# Patient Record
Sex: Male | Born: 1965
Health system: Southern US, Community
[De-identification: ages and names within clinical notes are randomized; demographics above are authoritative.]

## PROBLEM LIST (undated history)

## (undated) DIAGNOSIS — H538 Other visual disturbances: Secondary | ICD-10-CM

## (undated) DIAGNOSIS — T7840XA Allergy, unspecified, initial encounter: Secondary | ICD-10-CM

## (undated) DIAGNOSIS — Z8701 Personal history of pneumonia (recurrent): Secondary | ICD-10-CM

## (undated) DIAGNOSIS — E785 Hyperlipidemia, unspecified: Secondary | ICD-10-CM

## (undated) DIAGNOSIS — I10 Essential (primary) hypertension: Secondary | ICD-10-CM

## (undated) DIAGNOSIS — E119 Type 2 diabetes mellitus without complications: Secondary | ICD-10-CM

## (undated) HISTORY — DX: Hyperlipidemia, unspecified: E78.5

## (undated) HISTORY — DX: Allergy, unspecified, initial encounter: T78.40XA

## (undated) HISTORY — DX: Essential (primary) hypertension: I10

## (undated) HISTORY — DX: Other visual disturbances: H53.8

## (undated) HISTORY — DX: Type 2 diabetes mellitus without complications: E11.9

## (undated) HISTORY — DX: Personal history of pneumonia (recurrent): Z87.01

---

## 1987-06-11 DIAGNOSIS — Z8701 Personal history of pneumonia (recurrent): Secondary | ICD-10-CM

## 1987-06-11 DIAGNOSIS — Z8709 Personal history of other diseases of the respiratory system: Secondary | ICD-10-CM | POA: Insufficient documentation

## 1987-06-11 HISTORY — DX: Personal history of pneumonia (recurrent): Z87.01

## 1996-06-10 HISTORY — PX: VASECTOMY: SHX75

## 2006-06-04 ENCOUNTER — Ambulatory Visit: Payer: Self-pay | Admitting: Family Medicine

## 2006-12-03 ENCOUNTER — Ambulatory Visit: Payer: Self-pay | Admitting: Internal Medicine

## 2006-12-03 DIAGNOSIS — M79609 Pain in unspecified limb: Secondary | ICD-10-CM | POA: Insufficient documentation

## 2006-12-18 ENCOUNTER — Encounter: Payer: Self-pay | Admitting: Internal Medicine

## 2006-12-18 DIAGNOSIS — I10 Essential (primary) hypertension: Secondary | ICD-10-CM

## 2006-12-23 ENCOUNTER — Encounter (INDEPENDENT_AMBULATORY_CARE_PROVIDER_SITE_OTHER): Payer: Self-pay | Admitting: *Deleted

## 2008-04-11 ENCOUNTER — Ambulatory Visit: Payer: Self-pay | Admitting: Family Medicine

## 2010-06-10 DIAGNOSIS — E119 Type 2 diabetes mellitus without complications: Secondary | ICD-10-CM

## 2010-06-10 HISTORY — DX: Type 2 diabetes mellitus without complications: E11.9

## 2010-07-24 ENCOUNTER — Encounter: Payer: Self-pay | Admitting: Family Medicine

## 2010-07-24 ENCOUNTER — Ambulatory Visit (INDEPENDENT_AMBULATORY_CARE_PROVIDER_SITE_OTHER): Payer: 59 | Admitting: Family Medicine

## 2010-07-24 ENCOUNTER — Encounter (INDEPENDENT_AMBULATORY_CARE_PROVIDER_SITE_OTHER): Payer: Self-pay | Admitting: *Deleted

## 2010-07-24 DIAGNOSIS — E119 Type 2 diabetes mellitus without complications: Secondary | ICD-10-CM | POA: Insufficient documentation

## 2010-07-24 DIAGNOSIS — R3589 Other polyuria: Secondary | ICD-10-CM

## 2010-07-24 DIAGNOSIS — E1169 Type 2 diabetes mellitus with other specified complication: Secondary | ICD-10-CM | POA: Insufficient documentation

## 2010-07-24 DIAGNOSIS — R358 Other polyuria: Secondary | ICD-10-CM

## 2010-07-24 DIAGNOSIS — E1165 Type 2 diabetes mellitus with hyperglycemia: Secondary | ICD-10-CM | POA: Insufficient documentation

## 2010-07-24 DIAGNOSIS — R7309 Other abnormal glucose: Secondary | ICD-10-CM

## 2010-07-24 LAB — CONVERTED CEMR LAB
ALT: 29 U/L
AST: 22 U/L
Albumin: 4.9 g/dL
Alkaline Phosphatase: 108 U/L
BUN: 12 mg/dL
Bilirubin Urine: NEGATIVE
Blood Glucose, AC Bkfst: 310 mg/dL
CO2: 21 meq/L
Calcium: 10.2 mg/dL
Chloride: 93 meq/L
Cholesterol: 309 mg/dL
Creatinine, Ser: 1.27 mg/dL
Glucose, Bld: 334 mg/dL
HDL: 45 mg/dL
Hgb A1c MFr Bld: 14 %
LDL Cholesterol: HIGH mg/dL
Nitrite: NEGATIVE
Potassium: 3.8 meq/L
Sodium: 133 meq/L
TSH: 1.02 u[IU]/mL
Total Bilirubin: 0.5 mg/dL
Total Protein: 7.4 g/dL
Triglycerides: 739 mg/dL
Urobilinogen, UA: 0.2

## 2010-08-01 ENCOUNTER — Telehealth (INDEPENDENT_AMBULATORY_CARE_PROVIDER_SITE_OTHER): Payer: Self-pay | Admitting: *Deleted

## 2010-08-01 ENCOUNTER — Telehealth: Payer: Self-pay | Admitting: Family Medicine

## 2010-08-01 NOTE — Assessment & Plan Note (Signed)
Summary: TRANSFER FROM BILLIE/CHECK SUGAR/CLE  [employer]   Vital Signs:  Patient profile:   45 year old male Height:      72 inches Weight:      224.75 pounds BMI:     30.59 Temp:     98.5 degrees F oral Pulse rate:   84 / minute Pulse rhythm:   regular BP sitting:   160 / 110  (left arm) Cuff size:   large  Vitals Entered By: Selena Batten Dance CMA Duncan Dull) (July 24, 2010 8:49 AM)  Serial Vital Signs/Assessments:  Time      Position  BP       Pulse  Resp  Temp     By                     152/100                        Eustaquio Boyden  MD  CC: Check sugar Comments Has frequent urination and has lost  ~25 pounds in 45 days (some intentional but he didn't think it should've been that fast). His wife is a Engineer, civil (consulting) and checked his sugar yesterday (non-fasting) and it was 475.    History of Present Illness: CC: concerns about diabetes  wt 250lbs at beginning of year.  started fasting from red meat and started drinking more water.  dropped quickly to 224 lbs in last month.  since lifestyle change started, noticed polyuria and urgency.  Wife checked blood sugar yesterday which was 475.  not recently changed exercise regimen.  does stay active - in am exercise.  No dysuria, fevers/chills, flank pain, vomiting.  mild nausea in am.  No paresthesias.  No fmhx DM.  + fmhx HTN.  04/2010 blood work with elevated triglycerides as well as LDL.  Doesn't bring copy.  fasting today.    HTN - no HA, vision changes, chest pain, tightness, urinary changes, LE swelling.  has had HTN in past, never been on meds, states previuosly ran 130 systolic.  Today elevated but pt concerned about possibility of diabetes.  Current Medications (verified): 1)  None  Allergies: 1)  ! * Latex  Past History:  Past Medical History: Last updated: 12/18/2006 Hypertension Pneumonia, hx of (06/11/1987)  Past Surgical History: vasectomy 1998  Family History: father: healthy mother: HTN MGF: CAD/MI 80yo brother:  prediabetes uncle: some cancer  No other CA, CAD/MI, CVA  Social History: no smoking, occasional EtOH (monthly), no rec drugs caffeine: mountain dew 2 cans, tea about 2 cups/day Lives with wife Banker), 2 kids, no pets Occupation: Designer, industrial/product at [employer]  Review of Systems       per HPI  Physical Exam  General:  alert, well-developed, well-nourished, and well-hydrated.   Head:  normocephalic.   Eyes:  no injection.   Ears:  TMs clear bilaterally Mouth:  MMM, + tongue with yellow/white plaque Neck:  no LAD Lungs:  Normal respiratory effort, chest expands symmetrically. Lungs are clear to auscultation, no crackles or wheezes. Heart:  Normal rate and regular rhythm. S1 and S2 normal without gallop, murmur, click, rub or other extra sounds. Abdomen:  Bowel sounds positive,abdomen soft and non-tender without masses, organomegaly or hernias noted. Pulses:  2+ rad pulses, brisk cap refill Extremities:  no pedal edema Skin:  turgor normal and color normal.     Impression & Recommendations:  Problem # 1:  POLYURIA (ZOX-096.04) Assessment New concerning for new onset diabetes given  cbg yesterday 475 and today elevated 310.  checked UA to r/o infection.  Orders: Venipuncture ([address]109) Glucose, (CBG) (60454) Diabetic Clinic Referral (Diabetic) UA Dipstick w/Micro (automated) (81001) Glucose, (CBG) (09811)  Problem # 2:  OTHER ABNORMAL GLUCOSE (ICD-790.29) Assessment: New likely new onset diabetes.  start metformin, return in 2 wks for f/u.  set up with diabetes education DM.  cbg 310 fasting today, sxs of polyuria and polydipsia and weight loss.  discussed diagnosis, need for long term management and weight loss should help.  discussed pathophysiology of dx as well as most commonly used med, discussed benefits and side effects to watch out for.  His updated medication list for this problem includes:    Metformin Hcl 500 Mg Tabs (Metformin hcl) .Marland Kitchen... Take one nightly for 1 wk then increase  to one two times a day  Orders: Diabetic Clinic Referral (Diabetic) Specimen Handling (91478) TLB-TSH (Thyroid Stimulating Hormone) (84443-TSH) TLB-Hepatic/Liver Function Pnl (80076-HEPATIC) TLB-BMP (Basic Metabolic Panel-BMET) (80048-METABOL) TLB-Lipid Panel (80061-LIPID) TLB-A1C / Hgb A1C (Glycohemoglobin) (83036-A1C)  Problem # 3:  HYPERTENSION (ICD-401.9) bp up today, concerned about new DM dx.  recheck in 2 wks.  advised increase water, decrease sodium, increase potassium.  BP today: 160/110 Prior BP: 130/80 (04/11/2008)  Complete Medication List: 1)  Metformin Hcl 500 Mg Tabs (Metformin hcl) .... Take one nightly for 1 wk then increase to one two times a day 2)  Onetouch Ultra Mini W/device Kit (Blood glucose monitoring suppl) .... Or equivalent dx 250.02 3)  Onetouch Ultrasoft Lancets Misc (Lancets) .... Check twice daily dx 250.02 qs 1 month 4)  Onetouch Test Strp (Glucose blood) .... Check sugars twice daily dx 250.02 qs 1 mo  Patient Instructions: 1)  return in 2 weeks for follow up, and to recheck blood pressure (today it was 152/100).  Increase water, decrease salt. 2)  cut back on mountain dew and tea as they have alot of sugar. 3)  urine checked today. 4)  blood work today [A1c, CMP, TSH, FLP 790.29]. 5)  cbg today. 6)  start medicine called metformin, nightly for 1 wk then increase to twice daily - remember may cause GI upset initially. 7)  We will set you up with diabetes education class, take glucometer and supplies to that class. 8)  Call clinic with questions. 9)  May set up complete physical at your convenience. Prescriptions: ONETOUCH TEST  STRP (GLUCOSE BLOOD) check sugars twice daily dx 250.02 qs 1 mo  #1 x 11   Entered and Authorized by:   Eustaquio Boyden  MD   Signed by:   Eustaquio Boyden  MD on 07/24/2010   Method used:   [facility]        [facility] (retail)       [address]       [place], [place]  [address]        Ph: [phone] or [phone]       Fax: [phone]   RxID:   [id number] [facility] ULTRASOFT LANCETS  MISC (LANCETS) check twice daily dx 250.02 qs 1 month  #1 x 11   Entered and Authorized by:   Eustaquio Boyden  MD   Signed by:   Eustaquio Boyden  MD on 07/24/2010   Method used:   [facility]        [facility] (retail[facility]       [address]       [place], [place]  [address]  Ph: [phone] or [phone]       Fax: [phone]   RxID:   [id number] ONETOUCH ULTRA MINI W/DEVICE KIT (BLOOD GLUCOSE MONITORING SUPPL) or equivalent dx 250.02  #1 x 0   Entered and Authorized by:   Eustaquio Boyden  MD   Signed by:   Eustaquio Boyden  MD on 07/24/2010   Method used:   [facility]        [facility] ([facility]ail[facility]       [address]       [place], [place]  [address]       Ph: [phone] or [phone]       Fax: [phone]   RxID:   [id number] METFORMIN HCL 500 MG TABS (METFORMIN HCL) take one nightly for 1 wk then increase to one two times a day  #60 x 3   Entered and Authorized by:   Eustaquio Boyden  MD   Signed by:   Eustaquio Boyden  MD on 07/24/2010   Method used:   [facility]        [facility] (retail)       [address]       [place], [place]  [address]       Ph: [phone] or [phone]       Fax: [phone]   RxID:   [id number]    Orders Added: 1)  Venipuncture [24580] 2)  Glucose, (CBG) [82962] 3)  Est. Patient Level IV [99833] 4)  Diabetic Clinic Referral [Diabetic] 5)  Specimen Handling [99000] 6)  TLB-TSH (Thyroid Stimulating Hormone) [84443-TSH] 7)  TLB-Hepatic/Liver Function Pnl [80076-HEPATIC] 8)  TLB-BMP (Basic Metabolic Panel-BMET) [80048-METABOL] 9)  TLB-Lipid Panel [80061-LIPID] 10)  TLB-A1C / Hgb A1C (Glycohemoglobin) [83036-A1C] 11)  UA Dipstick w/Micro (automated) [81001] 12)  Glucose, (CBG) [82962]    Prior Medications: Current Allergies  (reviewed today): ! * LATEX  Laboratory Results   Urine Tests  Date/Time Received: July 24, 2010  Date/Time Reported: July 24, 2010   Routine Urinalysis   Color: yellow Appearance: Clear Glucose: 2000+   (Normal Range: Negative) Bilirubin: negative   (Normal Range: Negative) Ketone: moderate (40)   (Normal Range: Negative) Spec. Gravity: 1.020   (Normal Range: 1.003-1.035) Blood: trace-lysed   (Normal Range: Negative) pH: 5.0   (Normal Range: 5.0-8.0) Protein: trace   (Normal Range: Negative) Urobilinogen: 0.2   (Normal Range: 0-1) Nitrite: negative   (Normal Range: Negative) Leukocyte Esterace: negative   (Normal Range: Negative)  Urine Microscopic WBC/HPF: none RBC/HPF: rare Bacteria/HPF: tr Mucous/HPF: yes Epithelial/HPF: none Crystals/HPF: none Casts/LPF: none Yeast/HPF: none    Comments: read by .............Eustaquio Boyden  MD  July 24, 2010 12:27 PM    Blood Tests   Date/Time Received: July 24, 2010  Date/Time Reported: July 24, 2010   CBG Fasting:: 310mg /dL

## 2010-08-02 ENCOUNTER — Encounter: Payer: Self-pay | Admitting: Family Medicine

## 2010-08-02 ENCOUNTER — Other Ambulatory Visit (INDEPENDENT_AMBULATORY_CARE_PROVIDER_SITE_OTHER): Payer: 59

## 2010-08-02 ENCOUNTER — Encounter (INDEPENDENT_AMBULATORY_CARE_PROVIDER_SITE_OTHER): Payer: Self-pay | Admitting: *Deleted

## 2010-08-02 DIAGNOSIS — IMO0001 Reserved for inherently not codable concepts without codable children: Secondary | ICD-10-CM

## 2010-08-02 LAB — CONVERTED CEMR LAB
Creatinine,U: 249.6 mg/dL
LDL Cholesterol: 205 mg/dL

## 2010-08-07 ENCOUNTER — Ambulatory Visit: Payer: 59 | Admitting: Family Medicine

## 2010-08-07 ENCOUNTER — Encounter (INDEPENDENT_AMBULATORY_CARE_PROVIDER_SITE_OTHER): Payer: 59 | Admitting: Family Medicine

## 2010-08-07 ENCOUNTER — Encounter: Payer: Self-pay | Admitting: Family Medicine

## 2010-08-07 DIAGNOSIS — E785 Hyperlipidemia, unspecified: Secondary | ICD-10-CM

## 2010-08-07 DIAGNOSIS — IMO0001 Reserved for inherently not codable concepts without codable children: Secondary | ICD-10-CM

## 2010-08-07 DIAGNOSIS — Z23 Encounter for immunization: Secondary | ICD-10-CM

## 2010-08-07 DIAGNOSIS — Z0000 Encounter for general adult medical examination without abnormal findings: Secondary | ICD-10-CM

## 2010-08-07 DIAGNOSIS — I10 Essential (primary) hypertension: Secondary | ICD-10-CM

## 2010-08-07 DIAGNOSIS — E1169 Type 2 diabetes mellitus with other specified complication: Secondary | ICD-10-CM | POA: Insufficient documentation

## 2010-08-07 DIAGNOSIS — H538 Other visual disturbances: Secondary | ICD-10-CM | POA: Insufficient documentation

## 2010-08-07 NOTE — Progress Notes (Signed)
Summary: metformin causing blurred vision   Phone Note Call from Patient Call back at Home Phone [phone] Call back at [phone]   Caller: Patient Call For: Eustaquio Boyden  MD Summary of Call: Patient recently started taking metformin. He says that just a couple of days after starting it he noticed that his vision was blurry and has gradually gotten worse since. He is asking if it is okay to stop taking it of if he needs to try something else. [facility]  Initial call taken by: Melody Comas,  August 01, 2010 1:40 PM  Follow-up for Phone Call        i would like him to make eye doctor appointment to check vision and check with them.  that is not a side effect of metformin to my knowledge.  for now can switch to actos daily to see if any improvement.  sent in.  has diabetes education called pt to set up appt?   how are sugars running? Follow-up by: Eustaquio Boyden  MD,  August 02, 2010 8:11 AM  Additional Follow-up for Phone Call Additional follow up Details #1::        Spoke with patient. He said he had an eye exam 6 months ago and things were fine. I advised him to contact his eye doctor again to let them know of his recent new DM dx because they need to be aware and may want to do a more detailed exam. He did say the vision change did correspond with the metformin and that he looked it up online and that blurry vison was listed as a side effect. He did stop it and will start the actos. He said he sugars have been running around 180-200. He does not have an appt with Diabetes Ed yet. He said no one has called him to get anything set up.  Additional Follow-up by: Janee Morn CMA (AAMA),  August 02, 2010 9:22 AM   New Allergies: * METFORMIN Additional Follow-up for Phone Call Additional follow up Details #2::    noted.  will discuss at f/u appt tuesday. Follow-up by: Eustaquio Boyden  MD,  August 02, 2010 10:25 AM  New/Updated Medications: ACTOS 30 MG TABS  (PIOGLITAZONE HCL) take one daily for diabetes New Allergies: * METFORMINPrescriptions: ACTOS 30 MG TABS (PIOGLITAZONE HCL) take one daily for diabetes  #30 x 3   Entered and Authorized by:   Eustaquio Boyden  MD   Signed by:   Eustaquio Boyden  MD on 08/02/2010   Method used:   [facility]        [facility]  [place] (retail)       [address]       [place], [place]  [address]       Ph: [phone] or [phone]       Fax: [phone]   RxID:   [id number]

## 2010-08-07 NOTE — Miscellaneous (Signed)
Summary: [facility] results to flowsheet  Clinical Lists Changes  Observations: Added new observation of TSH: 1.020 microintl units/mL (07/24/2010 16:57) Added new observation of HGBA1C: 14.0 % (07/24/2010 16:57) Added new observation of CALCIUM: 10.2 mg/dL (13/01/6577 46:96) Added new observation of ALBUMIN: 4.9 g/dL (29/52/8413 24:40) Added new observation of PROTEIN, TOT: 7.4 g/dL (04/06/2535 64:40) Added new observation of SGPT (ALT): 29 units/L (07/24/2010 16:57) Added new observation of SGOT (AST): 22 units/L (07/24/2010 16:57) Added new observation of ALK PHOS: 108 units/L (07/24/2010 16:57) Added new observation of BILI TOTAL: 0.5 mg/dL (34/74/2595 63:87) Added new observation of CREATININE: 1.27 mg/dL (56/43/3295 18:84) Added new observation of BUN: 12 mg/dL (16/60/6301 60:10) Added new observation of BG RANDOM: 334 mg/dL (93/23/5573 22:02) Added new observation of CO2 PLSM/SER: 21 meq/L (07/24/2010 16:57) Added new observation of CL SERUM: 93 meq/L (07/24/2010 16:57) Added new observation of K SERUM: 3.8 meq/L (07/24/2010 16:57) Added new observation of NA: 133 meq/L (07/24/2010 16:57) Added new observation of LDL: HIGH mg/dL (54/27/0623 76:28) Added new observation of HDL: 45 mg/dL (31/51/7616 07:37) Added new observation of TRIGLYC TOT: 739 mg/dL (10/62/6948 54:62) Added new observation of CHOLESTEROL: 309 mg/dL (70/35/0093 81:82)

## 2010-08-07 NOTE — Progress Notes (Signed)
----   Converted from flag ---- ---- 07/31/2010 1:37 PM, Eustaquio Boyden  MD wrote: would like to rpt trig as well as direct LDL and microalb (272.4,250.02)  at [facility]?  plz ensure pt fasting when draw.  ---- 07/31/2010 11:45 AM, Liane Comber CMA (AAMA) wrote: This pt is scheduled for cpx labs thurs. He recently saw you and had labs done at that visit. Do you want any additional labs to be drawn or should we cancel his upcomming lab appt? Thanks Tash ------------------------------

## 2010-08-14 ENCOUNTER — Ambulatory Visit: Payer: Self-pay | Admitting: Family Medicine

## 2010-08-15 ENCOUNTER — Encounter: Payer: Self-pay | Admitting: Family Medicine

## 2010-08-15 LAB — HM DIABETES EYE EXAM

## 2010-08-16 NOTE — Assessment & Plan Note (Signed)
Summary: CPX / LFW   Vital Signs:  Patient profile:   45 year old male Weight:      225.75 pounds Temp:     98.3 degrees F oral Pulse rate:   76 / minute Pulse rhythm:   regular BP sitting:   136 / 82  (left arm) Cuff size:   large  Vitals Entered By: Selena Batten Dance CMA Duncan Dull) (August 07, 2010 8:39 AM) CC: CPX  Vision Screening:Left eye w/o correction: 20 / 20 Right Eye w/o correction: 20 / 30 Both eyes w/o correction:  20/ 15        Vision Entered By: Selena Batten Dance CMA (AAMA) (August 07, 2010 8:40 AM)   History of Present Illness: CC: CPE and issues  1. blurry vision - noted after starting metformin for diabetes, also feeling pressure in eyes.  Stopped metformin last week (2/22).  Since then blurry vision remaining.  No HA, nauesa/vomiting.  Scheduled to see eye doctor tomorrow morning [facility]).  Does wear reading glasses and for computer (vision last checked this past summer).    2. DM - did not tolerate metformin 2/2 blurry vision.  Started on Actos.  checking sugars, running 190 PM.  Around 2pm running 130.  fasting 130-170.  No paresthesias/numbness in fingers/toes.  scheduled for diabetes education next tuesday.  watching diet and correlating certain foods with sugar spikes.  3. HTN - bp better than previous appointment but a bit up still.  4. HLD - initial trig 700s, on rpt 187.  LDL 205.    CPE today -  no fm hx prostate cancer, no nocturia, strong stream. no fm hx colon cancer, some constipation, having stool every other day.  no blood in stool.   Preventive Screening-Counseling & Management  Alcohol-Tobacco     Smoking Status: quit  Current Medications (verified): 1)  Onetouch Ultra Mini W/device Kit (Blood Glucose Monitoring Suppl) .... or Equivalent Dx 250.02 2)  Onetouch Ultrasoft Lancets  Misc (Lancets) .... Check Twice Daily Dx 250.02 Qs 1 Month 3)  Onetouch Test  Strp (Glucose Blood) .... Check Sugars Twice Daily Dx 250.02 Qs 1 Mo 4)  Actos  30 Mg Tabs (Pioglitazone Hcl) .... Take One Daily For Diabetes  Allergies: 1)  ! 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   Head:  normocephalic.   Eyes:  no injection.   Mouth:  MMM Neck:  no LAD, no bruits, no TM Lungs:  Normal respiratory effort, chest expands symmetrically. Lungs are clear to auscultation, no crackles or wheezes. Heart:  Normal rate and regular rhythm. S1 and S2 normal without gallop, murmur, click, rub or other extra sounds. Pulses:  2+ rad pulses, brisk cap refill Extremities:  no pedal edema Skin:  turgor normal and color normal.     Impression & Recommendations:  Problem # 1:  DIABETES MELLITUS, TYPE II, UNCONTROLLED (ICD-250.02) Assessment Improved according to  sugar log, better control.  scheduled diabetes education for next week.  metformin caused blurry vision.  scheduled for eye exam today.  no conjunctivitis on exam today.  vision some diminished, await vision check.  started on actos.  discussed possible association with bladder cancer.  The following medications were removed from the medication list:    Metformin Hcl 500 Mg Tabs (Metformin hcl) .Marland Kitchen... Take one nightly for 1 wk then increase to one two times a day His updated medication list for this problem includes:    Actos 30 Mg Tabs (Pioglitazone hcl) .Marland Kitchen... Take one daily for diabetes  Labs  Reviewed: Creat: 1.27 (07/24/2010)    Reviewed HgBA1c results: 14.0 (07/24/2010)  Problem # 2:  HYPERTENSION (ICD-401.9) much improved today.  no new meds for now.  microalb normal.  BP today: 136/82 Prior BP: 160/110 (07/24/2010)  Labs Reviewed: K+: 3.8 (07/24/2010) Creat: : 1.27 (07/24/2010)   Chol: 309 (07/24/2010)   HDL: 45 (07/24/2010)   LDL: 205 (08/02/2010)   TG: 187 (08/02/2010)  Problem # 3:  DYSLIPIDEMIA (ICD-272.4) Assessment: New LDL too high , start atorvastatin.  discussed importance of control of LDL.  goal <100.  advised watch for myalgias.  His updated medication list for this problem includes:    Atorvastatin Calcium 40 Mg Tabs (Atorvastatin calcium) .Marland Kitchen... Take one nightly for cholesterol  Labs Reviewed: SGOT: 22 (07/24/2010)   SGPT: 29 (07/24/2010)   HDL:45 (07/24/2010)  LDL:205 (08/02/2010), HIGH (07/24/2010)  Chol:309 (07/24/2010)  Trig:187 (08/02/2010), 739 (07/24/2010)  Problem # 4:  HEALTH MAINTENANCE EXAM (ICD-V70.0) Reviewed preventive care protocols, scheduled due services, and updated immunizations.  prostate screening discussed, deferred to future. colon screening not due.  tdap given today.  Problem # 5:  BLURRED VISION (ICD-368.8) ? if due to metformin.  scheduled eye exam tomorow, advised to ask about metformin, await their recommendations.  Complete Medication List: 1)  Onetouch Ultra Mini W/device Kit (Blood glucose monitoring suppl) .... Or equivalent dx 250.02 2)  Onetouch Ultrasoft Lancets Misc (Lancets) .... Check twice daily dx 250.02 qs 1 month 3)  Onetouch Test Strp (Glucose blood) .... Check sugars twice daily dx 250.02 qs 1 mo 4)  Actos 30 Mg Tabs (Pioglitazone hcl) .... Take one daily for diabetes 5)  Atorvastatin Calcium 40 Mg Tabs (Atorvastatin calcium) .... Take one nightly for cholesterol  Other Orders: Tdap => 37yrs IM ([address]) Admin 1st Vaccine (60454)  Patient Instructions: 1)  Return in 3 months for follow up diabetes. 2)   my email address is [email or web address] 3)  Tdap (tetanus) shot today. 4)  Start medicine for cholesterol called atorvastatin 40mg  daily (lipitor).  watch out for muscle aches. 5)  Good to see you today, call clinic with questions. Prescriptions: ATORVASTATIN CALCIUM 40 MG TABS (ATORVASTATIN CALCIUM) take one nightly for cholesterol  #30 x 6   Entered and Authorized by:   Eustaquio Boyden  MD   Signed by:   Eustaquio Boyden  MD on 08/07/2010   Method used:   [facility]        [facility] (retail)       [address]       [place], [place]  [address]       Ph: [phone] or [phone]       Fax: [phone]   RxID:   [id number]    Orders Added: 1)  Tdap => 40yrs IM [90715] 2)  Admin 1st Vaccine [90471] 3)  Est. Patient 40-64 years [phone]  Immunizations Administered:  Tetanus Vaccine:    Vaccine Type: Tdap    Site: left deltoid    Mfr: GlaxoSmithKline    Dose: 0.5 ml    Route: IM    Given by: Selena Batten Dance CMA (AAMA)    Exp. Date: 03/29/2012    Lot #: [id number]    VIS given: 04/27/08 version given August 07, 2010.   Immunizations Administered:  Tetanus Vaccine:    Vaccine Type: Tdap    Site: left deltoid    Mfr: GlaxoSmithKline    Dose: 0.5 ml    Route: IM    Given by: Selena Batten Dance CMA (AAMA)    Exp. Date: 03/29/2012    Lot #: [id number]    VIS given: 04/27/08 version given August 07, 2010.  Current Allergies (reviewed today): ! * LATEX * METFORMIN   Prevention & Chronic Care Immunizations   Influenza vaccine: Not documented    Tetanus booster: 08/07/2010: Tdap   Tetanus booster due: 06/10/2009    Pneumococcal vaccine: Not documented  Other Screening   PSA: Not documented   Smoking status: quit  (08/07/2010)  Diabetes Mellitus   HgbA1C: 14.0  (07/24/2010)    Eye exam: Not documented    Foot exam: Not documented   High risk foot: Not documented   Foot care education: Not documented    Urine  microalbumin/creatinine ratio: Not documented  Lipids   Total Cholesterol: 309  (07/24/2010)   LDL: 205  (08/02/2010)   LDL Direct: Not documented   HDL: 45  (07/24/2010)   Triglycerides: 187  (08/02/2010)    SGOT (AST): 22  (07/24/2010)   SGPT (ALT): 29  (07/24/2010)   Alkaline phosphatase: 108  (07/24/2010)   Total bilirubin: 0.5  (07/24/2010)  Hypertension   Last Blood Pressure: 136 / 82  (08/07/2010)   Serum creatinine: 1.27  (07/24/2010)   Serum potassium 3.8  (07/24/2010)  Self-Management Support :    Diabetes self-management support: Not documented    Hypertension self-management support: Not documented    Lipid self-management support: Not documented

## 2010-08-28 NOTE — Letter (Signed)
Summary: [facility] - DM education  [facility]   Imported By: Kassie Mends 08/22/2010 09:13:34  _____________________________________________________________________  External Attachment:    Type:   Image     Comment:   External Document  Appended Document:  [facility] DM education started classes

## 2010-09-03 ENCOUNTER — Encounter: Payer: Self-pay | Admitting: Family Medicine

## 2010-09-09 ENCOUNTER — Ambulatory Visit: Payer: Self-pay | Admitting: Family Medicine

## 2010-10-09 ENCOUNTER — Ambulatory Visit: Payer: Self-pay | Admitting: Family Medicine

## 2010-10-26 NOTE — Assessment & Plan Note (Signed)
[facility]                           [place] OFFICE NOTE   Gerald Collins, Gerald Collins                         MRN:          [id number]  DATE:06/04/2006                            DOB:          [birth year]    Gerald Collins is a 45 year old black male who comes to establish with the  practice due to symptoms of upper respiratory infection and probable  conjunctivitis.   He indicates that he has had green wet drainage from his left eye moving  to his right eye for the last 5 days. He has symptoms of a cold with  nasal congestion for greater than a week. He has been using OTC  Clorocidin HP with minimal improvement.   He has had no recent medical care.   CURRENT MEDICATIONS:  None.   ALLERGIES:  LATEX.   PAST MEDICAL HISTORY:  He has a history of pneumonia, measles as a  child, no chicken pox or mumps and a history of hypertension. His only  surgery was a vasectomy in 1998 and was hospitalized for pneumonia in  1989.   SOCIAL HISTORY:  He is married and has 5 children ages 38, 79, 17, 45  and 67. It is of interest that his wife has 2 children in addition. He is  a Designer, industrial/product at [employer]. He indicates that he runs 5 times a week  approximately 2 miles each time. He smokes 1 cigarette every 6-7 months  with stress. Occasionally he uses alcohol and does not use street drugs.   REVIEW OF SYSTEMS:  He denies any headaches, dizziness or fainting. He  does have question of hearing loss. This has not been tested.  CARDIOVASCULAR:  He had the diagnosis of hypertension but was not  treated with medicines. He began running and losing weight and did not  need medication since this change in his lifestyle.  RESPIRATORY/GI/GU/MUSCULOSKELETAL/THROMBUS/ALLERGIC RHINITIS:  All  negative. He does have a tattoo on his left lateral arm. He has never  had a transfusion. He has not had a prostate exam in 10-15 years.   FAMILY HISTORY:  Father is living at age 84 with alcohol  abuse and  questionable depression otherwise no known medical problems. Mother is  living at age 53 with hypertension and hyperlipidemia. He has 2 brothers  living, the younger with a thyroid disorder. The other brother with no  known medical problems. He has two sisters living, the younger with  depression and has been hospitalized for this, the older with no known  medical problems.   With questions regarding the extended family find no further  cardiovascular problems. His mother's side of the family has a lot of  hypertension. Diabetes is in a maternal uncle who is on dialysis. To his  knowledge, there is no cancer, further depression or alcohol abuse in  the family.   IMMUNIZATIONS:  His last tetanus was 5 years ago. He has had the  hepatitis B vaccine but has not had the pneumonia or flu vaccine.   PHYSICAL EXAMINATION:  VITAL SIGNS:  Blood pressure 118/87, temperature  98.7, pulse is 88, weight is 236, height 6 feet.  GENERAL:  Well-developed, well-nourished, white male in no acute  distress.  HEENT:  TMs are retracted bilaterally, fluid present. Nasal mucosa is  erythematous and edematous. Posterior pharynx is injected without  exudate. Facial sinuses are minimally tender. Eyes, both conjunctivae  are injected. There is a small amount of mucous drainage on the lashes.  NECK:  Shoddy lymphadenopathy, no JVD or carotid bruits.  CHEST:  He has a moist harsh cough, no rales, rhonchi or wheezes.  HEART:  Rate and rhythm regular without murmur, gallop or rub.  MUSCULOSKELETAL:  Muscle mass is equal upper and lower extremities.  Strength is 5/5 laterally.  SKIN:  Without obvious lesions in the exposed areas.  PSYCHIATRIC:  Oriented x3. Verbalizes easily.   ASSESSMENT:  1. Upper respiratory infection. Will start him on amoxicillin 500 mg 2      b.i.d. for 7 days. He will continue the OTC per label and will see      him back in 5-7 days if not improved.  2. Conjunctivitis. Sodium  Sulamyd 10% drops both eyes q.i.d. for 1      week. Discussed hunching. Will see him back in 5 days if not      improved.      Billie D. Bean, FNP  Electronically Signed      Arta Silence, MD  Electronically Signed   BDB/MedQ  DD: 06/20/2006  DT: 06/20/2006  Job #: [id number]

## 2010-11-06 ENCOUNTER — Ambulatory Visit (INDEPENDENT_AMBULATORY_CARE_PROVIDER_SITE_OTHER): Payer: 59 | Admitting: Family Medicine

## 2010-11-06 ENCOUNTER — Encounter: Payer: Self-pay | Admitting: Family Medicine

## 2010-11-06 VITALS — BP 118/80 | HR 64 | Temp 98.3°F | Wt 227.1 lb

## 2010-11-06 DIAGNOSIS — IMO0001 Reserved for inherently not codable concepts without codable children: Secondary | ICD-10-CM

## 2010-11-06 DIAGNOSIS — B353 Tinea pedis: Secondary | ICD-10-CM | POA: Insufficient documentation

## 2010-11-06 DIAGNOSIS — I10 Essential (primary) hypertension: Secondary | ICD-10-CM

## 2010-11-06 DIAGNOSIS — E785 Hyperlipidemia, unspecified: Secondary | ICD-10-CM

## 2010-11-06 DIAGNOSIS — M79609 Pain in unspecified limb: Secondary | ICD-10-CM

## 2010-11-06 MED ORDER — CLOTRIMAZOLE 1 % EX CREA: TOPICAL_CREAM | Freq: Two times a day (BID) | CUTANEOUS | Status: DC

## 2010-11-06 NOTE — Progress Notes (Signed)
Addended by: Eustaquio Boyden on: 11/06/2010 05:43 PM   Modules accepted: Orders

## 2010-11-06 NOTE — Assessment & Plan Note (Signed)
On atorvastatin, tolerating. Recheck FLP when returns fasting for blood work. Goal <100.

## 2010-11-06 NOTE — Assessment & Plan Note (Signed)
Good control today, off meds. Anticipate improvement 2/2 lifestyle changes.

## 2010-11-06 NOTE — Assessment & Plan Note (Signed)
Good control as evidenced by recent sugars.  Anticipate improved A1c, <8%.  Discussed goal of <7%, may add metformin if not at goal. Foot exam today. Lab Results  Component Value Date   HGBA1C 14.0 07/24/2010

## 2010-11-06 NOTE — Assessment & Plan Note (Signed)
Recommended clotrimazole cream, keep area dry/clean.

## 2010-11-06 NOTE — Progress Notes (Signed)
  Subjective:    Patient ID: Gerald Collins, male    DOB: 26-Dec-1965, 45 y.o.   MRN: [id number]  HPI CC: DM f/u  DM - checking sugars every day.  Fasting in am 90-110.  Occasional 200s.  Self stopped actos 2/2 bladder cancer concerns.  Sugar dropped to 60s with actos.  Metformin stopped because caused blurry vision.  Denies CP/tightness, SOB.  Wanted to try sugar control with diet/lifestyle changes only.  H/o L foot injury in past, but denies specific inciting event.  S/p injections into foot by podiatry.  Sometimes hurts him even now.  HTN - hasn't recently checked bp at home.  Today good.  3 wks ago 130/80s.  No HA, vision changes, CP/tightness, SOB, leg swelling.  Weight dropped to 217 but then started lifting weights, gained muscle weight.  Has been exercising 1 hour 4 times a day. Wt Readings from Last 3 Encounters:  11/06/10 227 lb 1.9 oz (103.021 kg)  08/07/10 225 lb 12 oz (102.4 kg)  07/24/10 224 lb 12 oz (101.946 kg)    Medications and allergies reviewed and updated in chart. PMHx reviewed and updated in chart.  Review of Systems Per HPI    Objective:   Physical Exam  Nursing note and vitals reviewed. Constitutional: He appears well-developed and well-nourished. No distress.  HENT:  Head: Normocephalic and atraumatic.  Mouth/Throat: Oropharynx is clear and moist. No oropharyngeal exudate.  Eyes: Conjunctivae are normal. Pupils are equal, round, and reactive to light. No scleral icterus.  Neck: Normal range of motion. Neck supple. Carotid bruit is not present.  Cardiovascular: Normal rate, regular rhythm, normal heart sounds and intact distal pulses.   No murmur heard. Pulses:      Radial pulses are 2+ on the right side, and 2+ on the left side.       Dorsalis pedis pulses are 2+ on the right side, and 2+ on the left side.       Posterior tibial pulses are 2+ on the right side, and 2+ on the left side.  Pulmonary/Chest: Effort normal and breath sounds normal. No  respiratory distress. He has no wheezes. He has no rales.  Musculoskeletal:       Right foot: Normal.       Left foot: He exhibits tenderness. He exhibits no bony tenderness and no swelling.       Feet:  Lymphadenopathy:    He has no cervical adenopathy.  Skin: Skin is warm. No rash noted.  Psychiatric: He has a normal mood and affect.       Diabetic foot exam: Some hyperpigmentation and scaling soles bilateral feet consistent with tinea + skin breakdown - between digits bilateral feet with maceration/tinea infection + calluses bilateral medial big toes Normal DP/PT pulses Normal sensation to light tough and decreased sensation monofilament on left sole Nail thickened on R   Assessment & Plan:

## 2010-11-06 NOTE — Patient Instructions (Signed)
Return fasting for blood work some day this week. Good job with sugars up to now.  I expect A1c level to have come down.  Goal is <7%. Pass by [facility]'s office for referral to foot doctor. Call us with questions. Return in 3 months for follow up.

## 2010-11-06 NOTE — Assessment & Plan Note (Signed)
?   Neuroma vs collapse of transverse arch.  discussed metatarsal pad, pt states has had insoles made in past, didn't help. Requests referral to podiatrist to further evaluate.

## 2010-11-08 ENCOUNTER — Other Ambulatory Visit (INDEPENDENT_AMBULATORY_CARE_PROVIDER_SITE_OTHER): Payer: 59 | Admitting: Family Medicine

## 2010-11-08 DIAGNOSIS — I10 Essential (primary) hypertension: Secondary | ICD-10-CM

## 2010-11-08 DIAGNOSIS — E785 Hyperlipidemia, unspecified: Secondary | ICD-10-CM

## 2010-11-08 DIAGNOSIS — IMO0001 Reserved for inherently not codable concepts without codable children: Secondary | ICD-10-CM

## 2010-11-08 LAB — COMPREHENSIVE METABOLIC PANEL
AST: 67 U/L
Albumin: 4.7
BUN: 12 mg/dL (ref 4–21)
CO2: 25 mmol/L
Calcium: 9.6 mg/dL
Chloride: 102 mmol/L
Potassium: 4.3 mmol/L
Total Protein: 6.7 g/dL

## 2010-11-08 LAB — MICROALBUMIN / CREATININE URINE RATIO
Microalb Creat Ratio: 1.1
Microalb, Ur: 1.5

## 2010-11-08 LAB — HEMOGLOBIN A1C: A1c: 6.7

## 2010-11-08 LAB — LIPID PANEL: HDL: 51 mg/dL (ref 35–70)

## 2010-11-09 ENCOUNTER — Ambulatory Visit: Payer: Self-pay | Admitting: Family Medicine

## 2010-11-12 ENCOUNTER — Telehealth: Payer: Self-pay | Admitting: Family Medicine

## 2010-11-12 DIAGNOSIS — E785 Hyperlipidemia, unspecified: Secondary | ICD-10-CM

## 2010-11-12 NOTE — Telephone Encounter (Signed)
See scanned phone note. LDL not at goal.  However LFTs slightly elevated, so no increase in lipitor for now. Would like him to return in 4 mo for recheck. Order in chart. A1c great at 6.7%.  Kidneys, sugar, protein in urine normal.

## 2010-11-13 ENCOUNTER — Encounter: Payer: Self-pay | Admitting: Family Medicine

## 2010-11-13 NOTE — Telephone Encounter (Signed)
Pt returned call, I advised results and scheduled lab appt.

## 2010-11-13 NOTE — Telephone Encounter (Signed)
Attempted to call patient. No answer/no machine at home #. Cell # was incorrect per the person that answered. Will try home # again later.

## 2011-02-12 ENCOUNTER — Encounter: Payer: Self-pay | Admitting: Family Medicine

## 2011-02-12 ENCOUNTER — Ambulatory Visit (INDEPENDENT_AMBULATORY_CARE_PROVIDER_SITE_OTHER): Payer: 59 | Admitting: Family Medicine

## 2011-02-12 DIAGNOSIS — E785 Hyperlipidemia, unspecified: Secondary | ICD-10-CM

## 2011-02-12 DIAGNOSIS — IMO0001 Reserved for inherently not codable concepts without codable children: Secondary | ICD-10-CM

## 2011-02-12 DIAGNOSIS — I10 Essential (primary) hypertension: Secondary | ICD-10-CM

## 2011-02-12 LAB — COMPREHENSIVE METABOLIC PANEL
AST: 22 U/L
Albumin: 4.9
BUN: 17 mg/dL (ref 4–21)
Chloride: 100 mmol/L
Creat: 1.26
Total Bilirubin: 0.5 mg/dL
Total Protein: 7.3 g/dL

## 2011-02-12 NOTE — Assessment & Plan Note (Signed)
Recheck FLP today, off lipitor. Likely restart if elevated above goal.

## 2011-02-12 NOTE — Progress Notes (Signed)
  Subjective:    Patient ID: Gerald Collins, male    DOB: 01/03/1966, 45 y.o.   MRN: [id number]  HPI CC: DM f/u  1. DM - diet/lifestyle controlled.  going to gym daily - weight up from muscle mass.  More grilling currently.  No paresthesias.  A1c from 14 to 6.7% with diet/lifestyle changes.  Metformin trial caused blurry vision.  Sugars at home checked every morning fasting- reading 95-108.  Vision checked march 2012, told normal.  Did attend [facility] DM education class.   Wt Readings from Last 3 Encounters:  02/12/11 229 lb 8 oz (104.101 kg)  11/06/10 227 lb 1.9 oz (103.021 kg)  08/07/10 225 lb 12 oz (102.4 kg)    2. HTN - No HA, vision changes, CP/tightness, SOB, leg swelling.  BP a bit elevated today, states due to stress.  Previously well controlled.  Limits salt intake. BP Readings from Last 3 Encounters:  02/12/11 148/90  11/06/10 118/80  08/07/10 136/82   3. HLD - off lipitor, stopped 1 1/2 mo ago, "missed too many doses" so stopped.  Last LDL 117.  Would like recheck to decide if still needs med (with recent diet and lifestyle changes).  Review of Systems Per HPI    Objective:   Physical Exam  Nursing note and vitals reviewed. Constitutional: He appears well-developed and well-nourished. No distress.  HENT:  Head: Normocephalic and atraumatic.  Mouth/Throat: Oropharynx is clear and moist. No oropharyngeal exudate.  Eyes: Conjunctivae and EOM are normal. Pupils are equal, round, and reactive to light. No scleral icterus.  Neck: Normal range of motion. Neck supple. Carotid bruit is not present.  Cardiovascular: Normal rate, regular rhythm, normal heart sounds and intact distal pulses.   No murmur heard. Pulmonary/Chest: Breath sounds normal. No respiratory distress. He has no wheezes. He has no rales.  Abdominal: Soft. There is no tenderness.       No abd/renal bruits  Skin: Skin is warm and dry. No rash noted.          Assessment & Plan:

## 2011-02-12 NOTE — Assessment & Plan Note (Addendum)
Good control last visit, recheck today.  If good control, may have him return in 6 mo for f/u. Goal <7%. Lab Results  Component Value Date   HGBA1C 14.0 07/24/2010

## 2011-02-12 NOTE — Patient Instructions (Signed)
Blood work today. If bad cholesterol level staying elevated, we will restart lipitor. Depending on blood work will have you return in 3 -6 months.

## 2011-02-12 NOTE — Assessment & Plan Note (Addendum)
elevated today, improved somewhat after recheck. May need to start ACEI if staying elevated.

## 2011-02-18 ENCOUNTER — Encounter: Payer: Self-pay | Admitting: Family Medicine

## 2011-03-12 ENCOUNTER — Other Ambulatory Visit: Payer: 59

## 2011-08-13 ENCOUNTER — Encounter: Payer: Self-pay | Admitting: Family Medicine

## 2011-08-13 ENCOUNTER — Ambulatory Visit (INDEPENDENT_AMBULATORY_CARE_PROVIDER_SITE_OTHER): Payer: 59 | Admitting: Family Medicine

## 2011-08-13 VITALS — BP 128/84 | HR 76 | Temp 98.4°F | Wt 219.5 lb

## 2011-08-13 DIAGNOSIS — E785 Hyperlipidemia, unspecified: Secondary | ICD-10-CM

## 2011-08-13 DIAGNOSIS — J3489 Other specified disorders of nose and nasal sinuses: Secondary | ICD-10-CM

## 2011-08-13 DIAGNOSIS — IMO0001 Reserved for inherently not codable concepts without codable children: Secondary | ICD-10-CM

## 2011-08-13 DIAGNOSIS — R0981 Nasal congestion: Secondary | ICD-10-CM

## 2011-08-13 DIAGNOSIS — B353 Tinea pedis: Secondary | ICD-10-CM

## 2011-08-13 DIAGNOSIS — I10 Essential (primary) hypertension: Secondary | ICD-10-CM

## 2011-08-13 MED ORDER — FLUTICASONE PROPIONATE 50 MCG/ACT NA SUSP: 2.0000 | Freq: Every day | NASAL | Status: DC

## 2011-08-13 NOTE — Assessment & Plan Note (Signed)
Goal LDL <100 given DM hx. Recheck today to see if diet changes/ weight loss has helped LDL reached goal.

## 2011-08-13 NOTE — Assessment & Plan Note (Signed)
Slightly above goal today. Overall stable with diet/lifestyle changes. Discussed starting ACEI - will check Cr today.

## 2011-08-13 NOTE — Assessment & Plan Note (Signed)
Again rec OTC lotrimin.

## 2011-08-13 NOTE — Assessment & Plan Note (Signed)
Trial of flonase sent to pharmacy

## 2011-08-13 NOTE — Patient Instructions (Signed)
Blood work today. For sinus congestion - try flonase (sent to pharmacy) For feet (some athlete's foot) - use lotrimin cream over the counter. Return in 6 months. We will update you with results of blood work.

## 2011-08-13 NOTE — Progress Notes (Signed)
  Subjective:    Patient ID: Gerald Collins, male    DOB: 04-01-1966, 46 y.o.   MRN: [id number]  HPI CC: 6 mo f/u DM  DM - doesn't really check sugars unless feeling ill.  Mild paresthesias in feet.  Last vision screen 08/2010.  Coming up due.  Foot exam today.  Working out 1 1/2 hours/day.  Grills foods.  Weight loss noted - has been trying.  Wt Readings from Last 3 Encounters:  08/13/11 219 lb 8 oz (99.565 kg)  02/12/11 229 lb 8 oz (104.101 kg)  11/06/10 227 lb 1.9 oz (103.021 kg)   Elevated BP in past - also diet controlled.  No HA, vision changes, CP/tightness, SOB, leg swelling.    HLD - advised start lipitor last visit, did not start taking.  Wants rechecked today with diet changes.  Sinus issues - significant posterior nasal drainage.  Has tried sinus meds in past.  Has also used steam and humidifiers which helps some.  Has used neti pot and nasal saline.  Mostly loose drainage.  Has not tried nasal steroid in past.  Review of Systems Per HPI    Objective:   Physical Exam  Nursing note and vitals reviewed. Constitutional: He appears well-developed and well-nourished. No distress.  HENT:  Head: Normocephalic and atraumatic.  Right Ear: External ear normal.  Left Ear: External ear normal.  Nose: Nose normal.  Mouth/Throat: Oropharynx is clear and moist. No oropharyngeal exudate.  Eyes: Conjunctivae and EOM are normal. Pupils are equal, round, and reactive to light. No scleral icterus.  Neck: Normal range of motion. Neck supple.  Cardiovascular: Normal rate, regular rhythm, normal heart sounds and intact distal pulses.   No murmur heard. Pulmonary/Chest: Effort normal and breath sounds normal. No respiratory distress. He has no wheezes. He has no rales.  Musculoskeletal: He exhibits no edema.       Diabetic foot exam: Normal inspection Skin breakdown between lateral digits bilaterally No calluses Normal DP/PT pulses Normal sensation to light touch and monofilament Left  big toe with onychomycosis  Lymphadenopathy:    He has no cervical adenopathy.  Skin: Skin is warm and dry. No rash noted.  Psychiatric: He has a normal mood and affect.      Assessment & Plan:

## 2011-08-13 NOTE — Assessment & Plan Note (Signed)
Great control with A1c 6.% off meds.  Only with diet/lifestyle changes. Congratulated and encouraged. Recheck today. rtc 54mo f/u. rec schedule vision screen.

## 2011-08-14 ENCOUNTER — Other Ambulatory Visit: Payer: Self-pay | Admitting: Family Medicine

## 2011-08-14 ENCOUNTER — Encounter: Payer: Self-pay | Admitting: Family Medicine

## 2011-08-14 LAB — LIPID PANEL
Chol/HDL Ratio: 4.6 ratio units (ref 0.0–5.0)
HDL: 49 mg/dL (ref >39)
LDL Calculated: 152 mg/dL — ABNORMAL HIGH (ref 0–99)
Triglycerides: 110 mg/dL (ref 0–149)
VLDL Cholesterol Cal: 22 mg/dL (ref 5–40)

## 2011-08-14 LAB — BASIC METABOLIC PANEL
CO2: 22 mmol/L (ref 20–32)
Calcium: 9.9 mg/dL (ref 8.7–10.2)
Chloride: 102 mmol/L (ref 97–108)
Potassium: 4.5 mmol/L (ref 3.5–5.2)
Sodium: 140 mmol/L (ref 134–144)

## 2011-08-14 LAB — MICROALBUMIN / CREATININE URINE RATIO: Microalbumin, Urine: 11.2 ug/mL (ref 0.0–17.0)

## 2011-08-14 MED ORDER — LISINOPRIL 5 MG PO TABS: 5.0000 mg | ORAL_TABLET | Freq: Every day | ORAL | Status: DC

## 2011-08-19 ENCOUNTER — Encounter: Payer: Self-pay | Admitting: *Deleted

## 2012-02-06 ENCOUNTER — Other Ambulatory Visit: Payer: Self-pay | Admitting: *Deleted

## 2012-02-06 MED ORDER — GLUCOSE BLOOD VI STRP: ORAL_STRIP | Status: DC

## 2012-02-27 ENCOUNTER — Encounter: Payer: Self-pay | Admitting: Family Medicine

## 2012-02-27 ENCOUNTER — Ambulatory Visit (INDEPENDENT_AMBULATORY_CARE_PROVIDER_SITE_OTHER): Payer: 59 | Admitting: Family Medicine

## 2012-02-27 VITALS — BP 118/80 | HR 68 | Temp 98.2°F | Wt 225.2 lb

## 2012-02-27 DIAGNOSIS — J069 Acute upper respiratory infection, unspecified: Secondary | ICD-10-CM

## 2012-02-27 DIAGNOSIS — I10 Essential (primary) hypertension: Secondary | ICD-10-CM

## 2012-02-27 DIAGNOSIS — E785 Hyperlipidemia, unspecified: Secondary | ICD-10-CM

## 2012-02-27 DIAGNOSIS — E119 Type 2 diabetes mellitus without complications: Secondary | ICD-10-CM

## 2012-02-27 MED ORDER — GUAIFENESIN-CODEINE 100-10 MG/5ML PO SYRP: 5.0000 mL | ORAL_SOLUTION | Freq: Two times a day (BID) | ORAL | Status: DC | PRN

## 2012-02-27 NOTE — Assessment & Plan Note (Signed)
Anticipate viral uri, possible viral sinusitis. Treat with supportive care as per instructions, red flags to concern for bacterial infection discussed. cheratussin for cough at night.

## 2012-02-27 NOTE — Addendum Note (Signed)
Addended by: Eustaquio Boyden on: 02/27/2012 11:59 AM   Modules accepted: Orders

## 2012-02-27 NOTE — Progress Notes (Signed)
  Subjective:    Patient ID: Gerald Collins, male    DOB: 06/21/1965, 46 y.o.   MRN: [id number]  HPI CC: sinus congestion  5d h/o sinus congestion.  Lots of sinus drainage worse at night.  ++ PNdrainage.  + sneezing and RN.  Head congestion/coryza.  ST initially.  Coughing - worse at night.  Dry cough.   Has tried nothing OTC so far.   No fevers/chills, abd pain, n/v, ear or tooth pain.  No HA.  No sick contacts at home . No smokers at home.  No h/o asthma. Recent trip back from [place] - weather change, attributes to illness.  H/o diet controlled diabetes Lab Results  Component Value Date   HGBA1C 6.1* 08/13/2011   Past Medical History  Diagnosis Date  . HTN (hypertension)   . Diabetes type 2, controlled 2012    controlled with lifestyle (diet, exercise) change  . H/O: pneumonia 06/11/1987  . Other specified visual disturbances   . HLD (hyperlipidemia)      Review of Systems Per HPI    Objective:   Physical Exam  Nursing note and vitals reviewed. Constitutional: He appears well-developed and well-nourished. No distress.       Tired, nontoxic  HENT:  Head: Normocephalic and atraumatic.  Right Ear: Hearing, tympanic membrane, external ear and ear canal normal.  Left Ear: Hearing, tympanic membrane, external ear and ear canal normal.  Nose: Mucosal edema present. No rhinorrhea. Right sinus exhibits frontal sinus tenderness. Right sinus exhibits no maxillary sinus tenderness. Left sinus exhibits no maxillary sinus tenderness and no frontal sinus tenderness.  Mouth/Throat: Uvula is midline, oropharynx is clear and moist and mucous membranes are normal. No oropharyngeal exudate, posterior oropharyngeal edema, posterior oropharyngeal erythema or tonsillar abscesses.  Eyes: Conjunctivae normal and EOM are normal. Pupils are equal, round, and reactive to light. No scleral icterus.  Neck: Normal range of motion. Neck supple.  Cardiovascular: Normal rate, regular rhythm, normal heart  sounds and intact distal pulses.   No murmur heard. Pulmonary/Chest: Effort normal and breath sounds normal. No respiratory distress. He has no wheezes. He has no rales.  Lymphadenopathy:    He has no cervical adenopathy.  Skin: Skin is warm and dry. No rash noted.       Assessment & Plan:

## 2012-02-27 NOTE — Patient Instructions (Signed)
Return in next few months fasting for blood work and afterwards for follow up. You have a sinus infection, but likely viral. Take medicine as prescribed: cheratussin for cough at night. Push fluids and plenty of rest. Nasal saline irrigation or neti pot to help drain sinuses. May use simple mucinex with plenty of fluid to help mobilize mucous. May use ibuprofen for sinus inflammation Let us know if fever >101.5, trouble opening/closing mouth, difficulty swallowing, or worsening - you may need to be seen again.

## 2012-04-02 ENCOUNTER — Ambulatory Visit: Payer: 59 | Admitting: Family Medicine

## 2012-04-02 DIAGNOSIS — Z0289 Encounter for other administrative examinations: Secondary | ICD-10-CM

## 2012-12-17 ENCOUNTER — Other Ambulatory Visit: Payer: Self-pay

## 2013-07-09 ENCOUNTER — Encounter: Payer: Self-pay | Admitting: Family Medicine

## 2013-07-09 ENCOUNTER — Ambulatory Visit (INDEPENDENT_AMBULATORY_CARE_PROVIDER_SITE_OTHER): Payer: 59 | Admitting: Family Medicine

## 2013-07-09 VITALS — BP 136/82 | HR 76 | Temp 98.9°F | Wt 236.5 lb

## 2013-07-09 DIAGNOSIS — J069 Acute upper respiratory infection, unspecified: Secondary | ICD-10-CM

## 2013-07-09 DIAGNOSIS — B9789 Other viral agents as the cause of diseases classified elsewhere: Principal | ICD-10-CM

## 2013-07-09 MED ORDER — GUAIFENESIN-CODEINE 100-10 MG/5ML PO SYRP: 5.0000 mL | ORAL_SOLUTION | Freq: Two times a day (BID) | ORAL | Status: DC | PRN

## 2013-07-09 NOTE — Progress Notes (Signed)
   Subjective:    Patient ID: Gerald Collins, male    DOB: 05/15/1966, 48 y.o.   MRN: [id number]  HPI CC: URI sxs  2d h/o productive cough, congestion, rhinorrhea.  Green mucous.  No appetite.  + HA, PNDrainage.  + fatigue.  No fevrs/chills, abd pain, ear or tooth pain, ST, no body aches So far has tried nothing   Wife sick 2 wks ago. No smokers at home. No h/o asthma. Did not receive flu shot.  Lab Results  Component Value Date   HGBA1C 6.1* 08/13/2011     Past Medical History  Diagnosis Date  . HTN (hypertension)   . Diabetes type 2, controlled 2012    controlled with lifestyle (diet, exercise) change  . H/O: pneumonia 06/11/1987  . Other specified visual disturbances   . HLD (hyperlipidemia)      Review of Systems Per HPI    Objective:   Physical Exam  Nursing note and vitals reviewed. Constitutional: He appears well-developed and well-nourished. No distress.  HENT:  Head: Normocephalic and atraumatic.  Right Ear: Hearing, tympanic membrane, external ear and ear canal normal.  Left Ear: Hearing, tympanic membrane, external ear and ear canal normal.  Nose: Mucosal edema and rhinorrhea present. Right sinus exhibits maxillary sinus tenderness. Right sinus exhibits no frontal sinus tenderness. Left sinus exhibits maxillary sinus tenderness. Left sinus exhibits no frontal sinus tenderness.  Mouth/Throat: Uvula is midline, oropharynx is clear and moist and mucous membranes are normal. No oropharyngeal exudate, posterior oropharyngeal edema, posterior oropharyngeal erythema or tonsillar abscesses.  Nasal mucosal congestion and edema/erythema  Eyes: Conjunctivae and EOM are normal. Pupils are equal, round, and reactive to light. No scleral icterus.  Neck: Normal range of motion. Neck supple.  Cardiovascular: Normal rate, regular rhythm, normal heart sounds and intact distal pulses.   No murmur heard. Pulmonary/Chest: Effort normal and breath sounds normal. No respiratory  distress. He has no wheezes. He has no rales.  Musculoskeletal: He exhibits no edema.  Lymphadenopathy:    He has no cervical adenopathy.  Skin: Skin is warm and dry. No rash noted.       Assessment & Plan:

## 2013-07-09 NOTE — Assessment & Plan Note (Signed)
Anticipate viral given short duration See pt instructions for plan. Red flags to update [place] for abx discussed.

## 2013-07-09 NOTE — Patient Instructions (Signed)
Sounds like you have a viral upper respiratory infection. Antibiotics are not needed for this.  Viral infections usually take 7-10 days to resolve.  The cough can last a few weeks to go away. Use medication as prescribed: codeine cough syrup Push fluids and plenty of rest. Ibuprofen for inflammation and claritin or zyrtec for runny nose during the day. Please return if you are not improving as expected, or if you have high fevers (>101.5) or difficulty swallowing or worsening productive cough. Call clinic with questions.  Good to see you today.

## 2013-07-09 NOTE — Progress Notes (Signed)
Pre-visit discussion using our clinic review tool. No additional management support is needed unless otherwise documented below in the visit note.  

## 2013-09-26 ENCOUNTER — Other Ambulatory Visit: Payer: Self-pay | Admitting: Family Medicine

## 2013-09-26 DIAGNOSIS — E119 Type 2 diabetes mellitus without complications: Secondary | ICD-10-CM

## 2013-10-01 ENCOUNTER — Other Ambulatory Visit (INDEPENDENT_AMBULATORY_CARE_PROVIDER_SITE_OTHER): Payer: 59

## 2013-10-01 DIAGNOSIS — E119 Type 2 diabetes mellitus without complications: Secondary | ICD-10-CM

## 2013-10-01 LAB — COMPREHENSIVE METABOLIC PANEL
ALK PHOS: 57 U/L (ref 39–117)
ALT: 28 U/L (ref 0–53)
AST: 29 U/L (ref 0–37)
Albumin: 4.5 g/dL (ref 3.5–5.2)
BILIRUBIN TOTAL: 0.8 mg/dL (ref 0.3–1.2)
BUN: 13 mg/dL (ref 6–23)
CO2: 29 mEq/L (ref 19–32)
Calcium: 9.6 mg/dL (ref 8.4–10.5)
Chloride: 106 mEq/L (ref 96–112)
Creatinine, Ser: 1.2 mg/dL (ref 0.4–1.5)
GFR: 80.62 mL/min (ref >60.00)
GLUCOSE: 100 mg/dL — AB (ref 70–99)
Potassium: 4 mEq/L (ref 3.5–5.1)
SODIUM: 140 meq/L (ref 135–145)
Total Protein: 7.1 g/dL (ref 6.0–8.3)

## 2013-10-01 LAB — LIPID PANEL
CHOLESTEROL: 230 mg/dL — AB (ref 0–200)
HDL: 41.5 mg/dL (ref >39.00)
LDL CALC: 159 mg/dL — AB (ref 0–99)
TRIGLYCERIDES: 147 mg/dL (ref 0.0–149.0)
Total CHOL/HDL Ratio: 6
VLDL: 29.4 mg/dL (ref 0.0–40.0)

## 2013-10-01 LAB — MICROALBUMIN / CREATININE URINE RATIO
Creatinine,U: 391.8 mg/dL
MICROALB/CREAT RATIO: 0.3 mg/g (ref 0.0–30.0)
Microalb, Ur: 1.3 mg/dL (ref 0.0–1.9)

## 2013-10-01 LAB — HEMOGLOBIN A1C: Hgb A1c MFr Bld: 6 % (ref 4.6–6.5)

## 2013-10-08 ENCOUNTER — Encounter: Payer: Self-pay | Admitting: Family Medicine

## 2013-10-08 ENCOUNTER — Ambulatory Visit (INDEPENDENT_AMBULATORY_CARE_PROVIDER_SITE_OTHER): Payer: 59 | Admitting: Family Medicine

## 2013-10-08 VITALS — BP 136/82 | HR 52 | Temp 97.8°F | Ht 72.0 in | Wt 230.8 lb

## 2013-10-08 DIAGNOSIS — Z0001 Encounter for general adult medical examination with abnormal findings: Secondary | ICD-10-CM | POA: Insufficient documentation

## 2013-10-08 DIAGNOSIS — Z0000 Encounter for general adult medical examination without abnormal findings: Secondary | ICD-10-CM | POA: Insufficient documentation

## 2013-10-08 DIAGNOSIS — R7309 Other abnormal glucose: Secondary | ICD-10-CM

## 2013-10-08 DIAGNOSIS — I10 Essential (primary) hypertension: Secondary | ICD-10-CM

## 2013-10-08 DIAGNOSIS — E785 Hyperlipidemia, unspecified: Secondary | ICD-10-CM

## 2013-10-08 DIAGNOSIS — R7303 Prediabetes: Secondary | ICD-10-CM

## 2013-10-08 NOTE — Assessment & Plan Note (Signed)
Reviewed dietary changes to affect better LDL.  Diet controlled.

## 2013-10-08 NOTE — Progress Notes (Signed)
BP 136/82  Pulse 52  Temp(Src) 97.8 F (36.6 C) (Oral)  Ht 6' (1.829 m)  Wt 230 lb 12 oz (104.668 kg)  BMI 31.29 kg/m2   CC: CPE  Subjective:    Patient ID: Gerald Collins, male    DOB: 02-04-1966, 47 y.o.   MRN: [id number]  HPI: Gerald Collins is a 48 y.o. male presenting on 10/08/2013 for Annual Exam   HTN - off meds, stable.  Better at home - 125/80. DM - doesn't hceck sugars at home. Actually A1c has been consistently <6.5%.   HLD - off meds, diet controlled. Wt Readings from Last 3 Encounters:  10/08/13 230 lb 12 oz (104.668 kg)  07/09/13 236 lb 8 oz (107.276 kg)  02/27/12 225 lb 4 oz (102.173 kg)  Body mass index is 31.29 kg/(m^2). Exercises every morning 1-2 hours.  Push ups, sit ups, free weights, runs 20 mi/wk on treadmill, rides bike. Wonders about low testosterone - normal sex drive, but possibly decreased energy and some moodiness endorsed.  Preventative: Colon cancer screening - too early.  Denies blood in stool or BM changes Prostate cancer - no fmhx Td 2012 Flu - doesn't receive Seat belt use discussed No changing spots on skin.  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  Allergies and medications reviewed and updated. Current Outpatient Prescriptions on File Prior to Visit  Medication Sig  . glucose blood test strip Test once daily as needed.   No current facility-administered medications on file prior to visit.    Review of Systems  Constitutional: Negative for fever, chills, activity change, appetite change, fatigue and unexpected weight change.  HENT: Negative for hearing loss.   Eyes: Negative for visual disturbance.  Respiratory: Negative for cough, chest tightness, shortness of breath and wheezing.     Cardiovascular: Negative for chest pain, palpitations and leg swelling.  Gastrointestinal: Negative for nausea, vomiting, abdominal pain, diarrhea, constipation, blood in stool and abdominal distention.  Genitourinary: Negative for hematuria and difficulty urinating.  Musculoskeletal: Negative for arthralgias, myalgias and neck pain.  Skin: Negative for rash.  Neurological: Negative for dizziness, seizures, syncope and headaches.  Hematological: Negative for adenopathy. Does not bruise/bleed easily.  Psychiatric/Behavioral: Negative for dysphoric mood. The patient is not nervous/anxious.    Per HPI unless specifically indicated above    Objective:    BP 136/82  Pulse 52  Temp(Src) 97.8 F (36.6 C) (Oral)  Ht 6' (1.829 m)  Wt 230 lb 12 oz (104.668 kg)  BMI 31.29 kg/m2  Physical Exam  Nursing note and vitals reviewed. Constitutional: He is oriented to person, place, and time. He appears well-developed and well-nourished. No distress.  HENT:  Head: Normocephalic and atraumatic.  Right Ear: Hearing, tympanic membrane, external ear and ear canal normal.  Left Ear: Hearing, tympanic membrane, external ear and ear canal normal.  Nose: Nose normal.  Mouth/Throat: Uvula is midline, oropharynx is clear and moist and mucous membranes are normal. No oropharyngeal exudate, posterior oropharyngeal edema or posterior oropharyngeal erythema.  Eyes: Conjunctivae and EOM are normal. Pupils are equal, round, and reactive to light. No scleral icterus.  Neck: Normal range of motion. Neck supple. No thyromegaly present.  Cardiovascular: Normal rate, regular rhythm, normal heart sounds and intact distal pulses.   No murmur heard.  Pulses:      Radial pulses are 2+ on the right side, and 2+ on the left side.  Pulmonary/Chest: Effort normal and breath sounds normal. No respiratory distress. He has no wheezes. He has no rales.  Abdominal: Soft. Bowel sounds are normal. He exhibits no distension and no  mass. There is no tenderness. There is no rebound and no guarding.  Musculoskeletal: Normal range of motion. He exhibits no edema.  Lymphadenopathy:    He has no cervical adenopathy.  Neurological: He is alert and oriented to person, place, and time.  CN grossly intact, station and gait intact  Skin: Skin is warm and dry. No rash noted.  Psychiatric: He has a normal mood and affect. His behavior is normal. Judgment and thought content normal.   Results for orders placed in visit on 10/01/13  COMPREHENSIVE METABOLIC PANEL      Result Value Ref Range   Sodium 140  135 - 145 mEq/L   Potassium 4.0  3.5 - 5.1 mEq/L   Chloride 106  96 - 112 mEq/L   CO2 29  19 - 32 mEq/L   Glucose, Bld 100 (*) 70 - 99 mg/dL   BUN 13  6 - 23 mg/dL   Creatinine, Ser 1.2  0.4 - 1.5 mg/dL   Total Bilirubin 0.8  0.3 - 1.2 mg/dL   Alkaline Phosphatase 57  39 - 117 U/L   AST 29  0 - 37 U/L   ALT 28  0 - 53 U/L   Total Protein 7.1  6.0 - 8.3 g/dL   Albumin 4.5  3.5 - 5.2 g/dL   Calcium 9.6  8.4 - 10.5 mg/dL   GFR 80.62  >60.00 mL/min  LIPID PANEL      Result Value Ref Range   Cholesterol 230 (*) 0 - 200 mg/dL   Triglycerides 147.0  0.0 - 149.0 mg/dL   HDL 41.50  >39.00 mg/dL   VLDL 29.4  0.0 - 40.0 mg/dL   LDL Cholesterol 159 (*) 0 - 99 mg/dL   Total CHOL/HDL Ratio 6    HEMOGLOBIN A1C      Result Value Ref Range   Hemoglobin A1C 6.0  4.6 - 6.5 %  MICROALBUMIN / CREATININE URINE RATIO      Result Value Ref Range   Microalb, Ur 1.3  0.0 - 1.9 mg/dL   Creatinine,U 391.8     Microalb Creat Ratio 0.3  0.0 - 30.0 mg/g      Assessment & Plan:   Problem List Items Addressed This Visit   HYPERTENSION     Chronic, stable. Off meds.    Prediabetes     Actually sugars consistently <6.5% so will change diagnosis to prediabetes. Discussed this with patient.    DYSLIPIDEMIA     Reviewed dietary changes to affect better LDL.  Diet controlled.    Health care maintenance - Primary     Preventative protocols  reviewed and updated unless pt declined. Discussed healthy diet and lifestyle.        Follow up plan: No Follow-up on file.

## 2013-10-08 NOTE — Assessment & Plan Note (Signed)
Preventative protocols reviewed and updated unless pt declined. Discussed healthy diet and lifestyle.  

## 2013-10-08 NOTE — Assessment & Plan Note (Signed)
Chronic, stable. Off meds.

## 2013-10-08 NOTE — Patient Instructions (Signed)
Good to see you, congratulations with your healthy diet and lifestyle choices!  Keep up the good work. Return to see me as needed or in 1 year for next physical.

## 2013-10-08 NOTE — Progress Notes (Signed)
Pre visit review using our clinic review tool, if applicable. No additional management support is needed unless otherwise documented below in the visit note. 

## 2013-10-08 NOTE — Assessment & Plan Note (Signed)
Actually sugars consistently <6.5% so will change diagnosis to prediabetes. Discussed this with patient.

## 2013-10-11 ENCOUNTER — Telehealth: Payer: Self-pay | Admitting: Family Medicine

## 2013-10-11 NOTE — Telephone Encounter (Signed)
Relevant patient education mailed to patient.  

## 2015-06-16 ENCOUNTER — Ambulatory Visit (INDEPENDENT_AMBULATORY_CARE_PROVIDER_SITE_OTHER): Payer: 59 | Admitting: Family Medicine

## 2015-06-16 ENCOUNTER — Encounter: Payer: Self-pay | Admitting: Family Medicine

## 2015-06-16 VITALS — BP 130/88 | HR 76 | Temp 98.3°F | Wt 239.5 lb

## 2015-06-16 DIAGNOSIS — R1013 Epigastric pain: Secondary | ICD-10-CM

## 2015-06-16 NOTE — Progress Notes (Signed)
BP 130/88 mmHg  Pulse 76  Temp(Src) 98.3 F (36.8 C) (Oral)  Wt 239 lb 8 oz (108.636 kg)   CC: ?hernia  Subjective:    Patient ID: Gerald Collins, male    DOB: January 16, 1966, 50 y.o.   MRN: [id number]  HPI: Gerald Collins is a 50 y.o. male presenting on 06/16/2015 for ? hernia   Presents with wife who is Therapist, sports. Last seen >1 yr ago. Due for CPE.  Yesterday while driving down street started laughing and felt pressure in abdomen that locked up. Felt severe cramping pain that lasted 10 min and had to get out of car. Pain did not radiate. Persistent abdominal dull soreness. This is second time it happened (last several months ago). More gassy as well. BM changes - thinning of stool and less frequent (3x/day -> 1x/day). Some pallor to stool. Possible mild GERD sxs this morning - acid reflux.  Hasn't tried any medicines for this yet.   No fevers/chills, nausea/vomiting , diarrhea or constipation, black tarry stool or blood in stool, no other abd pain. No chest pain, cough or dyspnea. No diet changes recently.   Relevant past medical, surgical, family and social history reviewed and updated as indicated. Interim medical history since our last visit reviewed. Allergies and medications reviewed and updated. Current Outpatient Prescriptions on File Prior to Visit  Medication Sig  . glucose blood test strip Test once daily as needed. (Patient not taking: Reported on 06/16/2015)   No current facility-administered medications on file prior to visit.    Review of Systems Per HPI unless specifically indicated in ROS section     Objective:    BP 130/88 mmHg  Pulse 76  Temp(Src) 98.3 F (36.8 C) (Oral)  Wt 239 lb 8 oz (108.636 kg)  Wt Readings from Last 3 Encounters:  06/16/15 239 lb 8 oz (108.636 kg)  10/08/13 230 lb 12 oz (104.668 kg)  07/09/13 236 lb 8 oz (107.276 kg)   Body mass index is 32.47 kg/(m^2).  Physical Exam  Constitutional: He appears well-developed and well-nourished. No  distress.  Cardiovascular: Normal rate, regular rhythm, normal heart sounds and intact distal pulses.   No murmur heard. Pulmonary/Chest: Effort normal and breath sounds normal. No respiratory distress. He has no wheezes. He has no rales.  Abdominal: Soft. Normal appearance and bowel sounds are normal. He exhibits no distension and no mass. There is no hepatosplenomegaly. There is tenderness in the epigastric area. There is no rigidity, no rebound, no guarding, no CVA tenderness and negative Murphy's sign. A hernia is present. Hernia confirmed positive in the ventral area (possible small with contraction of abd muscles).  Musculoskeletal: He exhibits no edema.  Psychiatric: He has a normal mood and affect.  Nursing note and vitals reviewed.     Assessment & Plan:  Advised pt return for CPE at his convenience Problem List Items Addressed This Visit    Abdominal pain, epigastric - Primary    Story and exam consistent with possible small ventral hernia. Ddx includes abd wall strain, cholelithiasis. Will check labs today (CBC, CMP, lipase) and order complete abd [place] + limited abd [place] to eval for ventral hernia. Encouraged weight loss. Update if recurrent pain for referral to surgery. Pt and wife agree with plan.      Relevant Orders   Comprehensive metabolic panel   CBC with Differential/Platelet   Lipase   US Abdomen Complete   US Abdomen Limited       Follow  up plan: Return if symptoms worsen or fail to improve.

## 2015-06-16 NOTE — Patient Instructions (Signed)
labwork today, pass by our referral coordinators to schedule ultrasound. Return at your convenience for physical.  Ventral Hernia A ventral hernia (also called an incisional hernia) is a hernia that occurs at the site of a previous surgical cut (incision) in the abdomen. The abdominal wall spans from your lower chest down to your pelvis. If the abdominal wall is weakened from a surgical incision, a hernia can occur. A hernia is a bulge of bowel or muscle tissue pushing out on the weakened part of the abdominal wall. Ventral hernias can get bigger from straining or lifting. Obese and older people are at higher risk for a ventral hernia. People who develop infections after surgery or require repeat incisions at the same site on the abdomen are also at increased risk. CAUSES  A ventral hernia occurs because of weakness in the abdominal wall at an incision site.  SYMPTOMS  Common symptoms include:  A visible bulge or lump on the abdominal wall.  Pain or tenderness around the lump.  Increased discomfort if you cough or make a sudden movement. If the hernia has blocked part of the intestine, a serious complication can occur (incarcerated or strangulated hernia). This can become a problem that requires emergency surgery because the blood flow to the blocked intestine may be cut off. Symptoms may include:  Feeling sick to your stomach (nauseous).  Throwing up (vomiting).  Stomach swelling (distention) or bloating.  Fever.  Rapid heartbeat. DIAGNOSIS  Your health care provider will take a medical history and perform a physical exam. Various tests may be ordered, such as:  Blood tests.  Urine tests.  Ultrasonography.  X-rays.  Computed tomography (CT). TREATMENT  Watchful waiting may be all that is needed for a smaller hernia that does not cause symptoms. Your health care provider may recommend the use of a supportive belt (truss) that helps to keep the abdominal wall intact. For larger  hernias or those that cause pain, surgery to repair the hernia is usually recommended. If a hernia becomes strangulated, emergency surgery needs to be done right away. HOME CARE INSTRUCTIONS  Avoid putting pressure or strain on the abdominal area.  Avoid heavy lifting.  Use good body positioning for physical tasks. Ask your health care provider about proper body positioning.  Use a supportive belt as directed by your health care provider.  Maintain a healthy weight.  Eat foods that are high in fiber, such as whole grains, fruits, and vegetables. Fiber helps prevent difficult bowel movements (constipation).  Drink enough fluids to keep your urine clear or pale yellow.  Follow up with your health care provider as directed. SEEK MEDICAL CARE IF:   Your hernia seems to be getting larger or more painful. SEEK IMMEDIATE MEDICAL CARE IF:   You have abdominal pain that is sudden and sharp.  Your pain becomes severe.  You have repeated vomiting.  You are sweating a lot.  You notice a rapid heartbeat.  You develop a fever. MAKE SURE YOU:   Understand these instructions.  Will watch your condition.  Will get help right away if you are not doing well or get worse.   This information is not intended to replace advice given to you by your health care provider. Make sure you discuss any questions you have with your health care provider.   Document Released: 05/13/2012 Document Revised: 06/17/2014 Document Reviewed: 05/13/2012 Elsevier Interactive Patient Education [employer].

## 2015-06-16 NOTE — Progress Notes (Signed)
Pre visit review using our clinic review tool, if applicable. No additional management support is needed unless otherwise documented below in the visit note. 

## 2015-06-16 NOTE — Assessment & Plan Note (Addendum)
Story and exam consistent with possible small ventral hernia. Ddx includes abd wall strain, cholelithiasis. Will check labs today (CBC, CMP, lipase) and order complete abd [place] + limited abd [place] to eval for ventral hernia. Encouraged weight loss. Update if recurrent pain for referral to surgery. Pt and wife agree with plan.

## 2015-06-16 NOTE — Addendum Note (Signed)
Addended by: Daralene Milch C on: 06/16/2015 11:17 AM   Modules accepted: Orders

## 2015-06-17 LAB — CBC WITH DIFFERENTIAL/PLATELET
Basophils Absolute: 0 10*3/uL (ref 0.0–0.2)
Basos: 1 %
EOS (ABSOLUTE): 0.1 10*3/uL (ref 0.0–0.4)
Eos: 2 %
Hematocrit: 40 % (ref 37.5–51.0)
Hemoglobin: 13.9 g/dL (ref 12.6–17.7)
Immature Grans (Abs): 0 10*3/uL (ref 0.0–0.1)
Immature Granulocytes: 0 %
Lymphocytes Absolute: 1.6 10*3/uL (ref 0.7–3.1)
Lymphs: 42 %
MCH: 27.4 pg (ref 26.6–33.0)
MCHC: 34.8 g/dL (ref 31.5–35.7)
MCV: 79 fL (ref 79–97)
Monocytes Absolute: 0.5 10*3/uL (ref 0.1–0.9)
Monocytes: 14 %
NEUTROS ABS: 1.5 10*3/uL (ref 1.4–7.0)
NEUTROS PCT: 41 %
Platelets: 195 10*3/uL (ref 150–379)
RBC: 5.08 x10E6/uL (ref 4.14–5.80)
RDW: 13.4 % (ref 12.3–15.4)
WBC: 3.7 10*3/uL (ref 3.4–10.8)

## 2015-06-17 LAB — COMPREHENSIVE METABOLIC PANEL
A/G RATIO: 1.9 (ref 1.1–2.5)
ALT: 70 IU/L — AB (ref 0–44)
AST: 52 IU/L — AB (ref 0–40)
Albumin: 4.8 g/dL (ref 3.5–5.5)
Alkaline Phosphatase: 69 IU/L (ref 39–117)
BILIRUBIN TOTAL: 0.3 mg/dL (ref 0.0–1.2)
BUN/Creatinine Ratio: 12 (ref 9–20)
BUN: 15 mg/dL (ref 6–24)
CO2: 25 mmol/L (ref 18–29)
Calcium: 9.8 mg/dL (ref 8.7–10.2)
Chloride: 100 mmol/L (ref 96–106)
Creatinine, Ser: 1.29 mg/dL — ABNORMAL HIGH (ref 0.76–1.27)
GFR calc Af Amer: 74 mL/min/{1.73_m2} (ref >59)
GFR calc non Af Amer: 64 mL/min/{1.73_m2} (ref >59)
Globulin, Total: 2.5 g/dL (ref 1.5–4.5)
Glucose: 96 mg/dL (ref 65–99)
POTASSIUM: 4.3 mmol/L (ref 3.5–5.2)
SODIUM: 139 mmol/L (ref 134–144)
Total Protein: 7.3 g/dL (ref 6.0–8.5)

## 2015-06-17 LAB — LIPASE: Lipase: 19 U/L (ref 0–59)

## 2015-06-19 ENCOUNTER — Encounter: Payer: Self-pay | Admitting: *Deleted

## 2015-06-20 ENCOUNTER — Ambulatory Visit
Admission: RE | Admit: 2015-06-20 | Discharge: 2015-06-20 | Disposition: A | Discharge status: Home / Self Care | Payer: 59 | Source: Ambulatory Visit | Attending: Family Medicine | Admitting: Family Medicine

## 2015-06-20 DIAGNOSIS — R1013 Epigastric pain: Secondary | ICD-10-CM

## 2015-07-16 ENCOUNTER — Other Ambulatory Visit: Payer: Self-pay | Admitting: Family Medicine

## 2015-07-16 DIAGNOSIS — R7303 Prediabetes: Secondary | ICD-10-CM

## 2015-07-16 DIAGNOSIS — E785 Hyperlipidemia, unspecified: Secondary | ICD-10-CM

## 2015-07-16 DIAGNOSIS — R740 Nonspecific elevation of levels of transaminase and lactic acid dehydrogenase [LDH]: Secondary | ICD-10-CM

## 2015-07-16 DIAGNOSIS — I10 Essential (primary) hypertension: Secondary | ICD-10-CM

## 2015-07-16 DIAGNOSIS — Z125 Encounter for screening for malignant neoplasm of prostate: Secondary | ICD-10-CM

## 2015-07-16 DIAGNOSIS — R7401 Elevation of levels of liver transaminase levels: Secondary | ICD-10-CM

## 2015-07-17 ENCOUNTER — Other Ambulatory Visit (INDEPENDENT_AMBULATORY_CARE_PROVIDER_SITE_OTHER): Payer: 59

## 2015-07-17 DIAGNOSIS — I10 Essential (primary) hypertension: Secondary | ICD-10-CM | POA: Diagnosis not present

## 2015-07-17 DIAGNOSIS — E785 Hyperlipidemia, unspecified: Secondary | ICD-10-CM | POA: Diagnosis not present

## 2015-07-17 DIAGNOSIS — R740 Nonspecific elevation of levels of transaminase and lactic acid dehydrogenase [LDH]: Secondary | ICD-10-CM

## 2015-07-17 DIAGNOSIS — R7401 Elevation of levels of liver transaminase levels: Secondary | ICD-10-CM

## 2015-07-17 DIAGNOSIS — Z125 Encounter for screening for malignant neoplasm of prostate: Secondary | ICD-10-CM

## 2015-07-17 DIAGNOSIS — R7303 Prediabetes: Secondary | ICD-10-CM

## 2015-07-17 LAB — COMPREHENSIVE METABOLIC PANEL
ALBUMIN: 4.9 g/dL (ref 3.5–5.2)
ALT: 30 U/L (ref 0–53)
AST: 20 U/L (ref 0–37)
Alkaline Phosphatase: 66 U/L (ref 39–117)
BILIRUBIN TOTAL: 0.5 mg/dL (ref 0.2–1.2)
BUN: 14 mg/dL (ref 6–23)
CALCIUM: 9.9 mg/dL (ref 8.4–10.5)
CO2: 31 meq/L (ref 19–32)
CREATININE: 1.29 mg/dL (ref 0.40–1.50)
Chloride: 102 mEq/L (ref 96–112)
GFR: 75.75 mL/min (ref >60.00)
Glucose, Bld: 112 mg/dL — ABNORMAL HIGH (ref 70–99)
Potassium: 4.3 mEq/L (ref 3.5–5.1)
Sodium: 140 mEq/L (ref 135–145)
Total Protein: 7.5 g/dL (ref 6.0–8.3)

## 2015-07-17 LAB — IBC PANEL
Iron: 76 ug/dL (ref 42–165)
SATURATION RATIOS: 16.5 % — AB (ref 20.0–50.0)
TRANSFERRIN: 330 mg/dL (ref 212.0–360.0)

## 2015-07-17 LAB — LIPID PANEL
CHOL/HDL RATIO: 6
CHOLESTEROL: 273 mg/dL — AB (ref 0–200)
HDL: 45.8 mg/dL (ref >39.00)
LDL Cholesterol: 197 mg/dL — ABNORMAL HIGH (ref 0–99)
NonHDL: 226.82
TRIGLYCERIDES: 147 mg/dL (ref 0.0–149.0)
VLDL: 29.4 mg/dL (ref 0.0–40.0)

## 2015-07-17 LAB — HEMOGLOBIN A1C: Hgb A1c MFr Bld: 6.5 % (ref 4.6–6.5)

## 2015-07-17 LAB — TSH: TSH: 1.43 u[IU]/mL (ref 0.35–4.50)

## 2015-07-17 LAB — PSA: PSA: 0.2 ng/mL (ref 0.10–4.00)

## 2015-07-24 ENCOUNTER — Encounter: Payer: Self-pay | Admitting: Family Medicine

## 2015-07-24 ENCOUNTER — Ambulatory Visit (INDEPENDENT_AMBULATORY_CARE_PROVIDER_SITE_OTHER): Payer: 59 | Admitting: Family Medicine

## 2015-07-24 VITALS — BP 136/76 | HR 64 | Temp 98.0°F | Wt 239.2 lb

## 2015-07-24 DIAGNOSIS — R1013 Epigastric pain: Secondary | ICD-10-CM

## 2015-07-24 DIAGNOSIS — Z0000 Encounter for general adult medical examination without abnormal findings: Secondary | ICD-10-CM

## 2015-07-24 DIAGNOSIS — Z1211 Encounter for screening for malignant neoplasm of colon: Secondary | ICD-10-CM

## 2015-07-24 DIAGNOSIS — E663 Overweight: Secondary | ICD-10-CM | POA: Insufficient documentation

## 2015-07-24 DIAGNOSIS — R6882 Decreased libido: Secondary | ICD-10-CM

## 2015-07-24 DIAGNOSIS — E785 Hyperlipidemia, unspecified: Secondary | ICD-10-CM

## 2015-07-24 DIAGNOSIS — E669 Obesity, unspecified: Secondary | ICD-10-CM | POA: Insufficient documentation

## 2015-07-24 DIAGNOSIS — E119 Type 2 diabetes mellitus without complications: Secondary | ICD-10-CM

## 2015-07-24 MED ORDER — ATORVASTATIN CALCIUM 40 MG PO TABS: 40.0000 mg | ORAL_TABLET | Freq: Every day | ORAL | Status: DC

## 2015-07-24 NOTE — Assessment & Plan Note (Signed)
Good control. Reviewed labs.

## 2015-07-24 NOTE — Assessment & Plan Note (Signed)
Encouraged healthy diet and lifestyle changes to affect sustainable weight loss.  

## 2015-07-24 NOTE — Progress Notes (Signed)
BP 136/76 mmHg  Pulse 64  Temp(Src) 98 F (36.7 C) (Oral)  Wt 239 lb 4 oz (108.523 kg)   CC: CPE  Subjective:    Patient ID: [name], male    DOB: 1966-01-24, 50 y.o.   MRN: [id number]  HPI: SAVA GEHMAN is a 50 y.o. male presenting on 07/24/2015 for Annual Exam   Persistent discomfort worse with greasy foods. Endorses pressure sensation in abdomen.   Diet controlled diabetes - reviewed with patient.   Requests testosterone level checked. Noticing more trouble keeping muscle mass. Denies depression, energy level ok. Some decreased libido.   Preventative: Colon cancer screening - discussed. Interested in colonoscopy. Prostate cancer - no fmhx. No nocturia or weakening of stream. Requests continued screening.  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 Interim medical history since our last visit reviewed. Allergies and medications reviewed and updated. Current Outpatient Prescriptions on File Prior to Visit  Medication Sig  . glucose blood test strip Test once daily as needed. (Patient not taking: Reported on 06/16/2015)   No current facility-administered medications on file prior to visit.    Review of Systems  Constitutional: Negative for fever, chills, activity change, appetite change, fatigue and unexpected weight change.  HENT: Negative for hearing loss.   Eyes: Negative for visual disturbance.  Respiratory: Negative for cough, chest tightness, shortness of breath and wheezing.   Cardiovascular: Negative for chest pain, palpitations and leg swelling.  Gastrointestinal: Positive for abdominal pain (bloating), diarrhea and  constipation. Negative for nausea, vomiting, blood in stool and abdominal distention.  Genitourinary: Negative for hematuria and difficulty urinating.  Musculoskeletal: Negative for myalgias, arthralgias and neck pain.  Skin: Negative for rash.  Neurological: Negative for dizziness, seizures, syncope and headaches.  Hematological: Negative for adenopathy. Does not bruise/bleed easily.  Psychiatric/Behavioral: Negative for dysphoric mood. The patient is not nervous/anxious.    Per HPI unless specifically indicated in ROS section     Objective:    BP 136/76 mmHg  Pulse 64  Temp(Src) 98 F (36.7 C) (Oral)  Wt 239 lb 4 oz (108.523 kg)  Wt Readings from Last 3 Encounters:  07/24/15 239 lb 4 oz (108.523 kg)  06/16/15 239 lb 8 oz (108.636 kg)  10/08/13 230 lb 12 oz (104.668 kg)   Body mass index is 32.44 kg/(m^2).  Physical Exam  Constitutional: He is oriented to person, place, and time. He appears well-developed and well-nourished. No distress.  HENT:  Head: Normocephalic and atraumatic.  Right Ear: Hearing, tympanic membrane, external ear and ear canal normal.  Left Ear: Hearing, tympanic membrane, external ear and ear canal normal.  Nose: Nose normal.  Mouth/Throat: Uvula is midline, oropharynx is clear and moist and mucous membranes are normal. No oropharyngeal exudate, posterior oropharyngeal edema or posterior oropharyngeal erythema.  Eyes: Conjunctivae and EOM are normal. Pupils are equal, round, and reactive to light. No scleral icterus.  Neck: Normal range of motion. Neck supple. No thyromegaly present.  Cardiovascular: Normal rate, regular rhythm, normal heart sounds and intact distal pulses.   No murmur heard. Pulses:      Radial pulses are 2+ on the right side, and 2+ on the  left side.  Pulmonary/Chest: Effort normal and breath sounds normal. No respiratory distress. He has no wheezes. He has no rales.  Abdominal: Soft. Bowel sounds are normal. He exhibits no distension and  no mass. There is no tenderness. There is no rebound and no guarding.  Genitourinary: Rectum normal and prostate normal. Rectal exam shows no external hemorrhoid, no internal hemorrhoid, no fissure, no mass, no tenderness and anal tone normal. Prostate is not enlarged (15gm) and not tender.  Musculoskeletal: Normal range of motion. He exhibits no edema.  Lymphadenopathy:    He has no cervical adenopathy.  Neurological: He is alert and oriented to person, place, and time.  CN grossly intact, station and gait intact  Skin: Skin is warm and dry. No rash noted.  Psychiatric: He has a normal mood and affect. His behavior is normal. Judgment and thought content normal.  Nursing note and vitals reviewed.  Results for orders placed or performed in visit on 07/17/15  Lipid panel  Result Value Ref Range   Cholesterol 273 (H) 0 - 200 mg/dL   Triglycerides 147.0 0.0 - 149.0 mg/dL   HDL 45.80 >39.00 mg/dL   VLDL 29.4 0.0 - 40.0 mg/dL   LDL Cholesterol 197 (H) 0 - 99 mg/dL   Total CHOL/HDL Ratio 6    NonHDL 226.82   Comprehensive metabolic panel  Result Value Ref Range   Sodium 140 135 - 145 mEq/L   Potassium 4.3 3.5 - 5.1 mEq/L   Chloride 102 96 - 112 mEq/L   CO2 31 19 - 32 mEq/L   Glucose, Bld 112 (H) 70 - 99 mg/dL   BUN 14 6 - 23 mg/dL   Creatinine, Ser 1.29 0.40 - 1.50 mg/dL   Total Bilirubin 0.5 0.2 - 1.2 mg/dL   Alkaline Phosphatase 66 39 - 117 U/L   AST 20 0 - 37 U/L   ALT 30 0 - 53 U/L   Total Protein 7.5 6.0 - 8.3 g/dL   Albumin 4.9 3.5 - 5.2 g/dL   Calcium 9.9 8.4 - 10.5 mg/dL   GFR 75.75 >60.00 mL/min  Hemoglobin A1c  Result Value Ref Range   Hgb A1c MFr Bld 6.5 4.6 - 6.5 %  TSH  Result Value Ref Range   TSH 1.43 0.35 - 4.50 uIU/mL  PSA  Result Value Ref Range   PSA 0.20 0.10 - 4.00 ng/mL  IBC panel  Result Value Ref Range   Iron 76 42 - 165 ug/dL   Transferrin 330.0 212.0 - 360.0 mg/dL   Saturation Ratios 16.5 (L) 20.0 - 50.0 %      Assessment & Plan:   Problem  List Items Addressed This Visit    Obesity, Class I, BMI 30-34.9    Encouraged healthy diet and lifestyle changes to affect sustainable weight loss      Health care maintenance - Primary    Preventative protocols reviewed and updated unless pt declined. Discussed healthy diet and lifestyle.       Dyslipidemia    LDL too high - start atorvastatin 40mg  daily. Discussed side effects to monitor. RTC 6 mo f/u visit.       Relevant Medications   atorvastatin (LIPITOR) 40 MG tablet   Diet-controlled diabetes mellitus ([place])    Good control. Reviewed labs.      Relevant Medications   atorvastatin (LIPITOR) 40 MG tablet   Abdominal pain, epigastric    No ventral hernia appreciated today - actually sxs improved with healthier diet changes. ?IBS -  suggested decrease gas producing foods, avoid lactose containing foods, trial probiotic or trial beano. Update if not improved with this.       Other Visit Diagnoses    Special screening for malignant neoplasms, colon        Relevant Orders    Ambulatory referral to Gastroenterology    Decreased sex drive        Relevant Orders    Testosterone        Follow up plan: Return in about 6 months (around 01/21/2016), or as needed, for follow up visit.

## 2015-07-24 NOTE — Patient Instructions (Addendum)
Exclude gas producing foods (beans, onions, celery, carrots, raisins, bananas, apricots, prunes, brussel sprouts, wheat germ, pretzels) Consider trail of lactose free diet (back off milk). Consider trial of beano for easier digestion of complex carbs. Consider trial probiotic like align or philips colon health.  We will refer you for colonoscopy screening. Try lipitor (atorvastatin) 40mg  daily for better cholesterol control Return in 6 months for f/u, prior fasting for labs. Return at your convenience for 8am blood work.  Health Maintenance, Male A healthy lifestyle and preventative care can promote health and wellness.  Maintain regular health, dental, and eye exams.  Eat a healthy diet. Foods like vegetables, fruits, whole grains, low-fat dairy products, and lean protein foods contain the nutrients you need and are low in calories. Decrease your intake of foods high in solid fats, added sugars, and salt. Get information about a proper diet from your health care provider, if necessary.  Regular physical exercise is one of the most important things you can do for your health. Most adults should get at least 150 minutes of moderate-intensity exercise (any activity that increases your heart rate and causes you to sweat) each week. In addition, most adults need muscle-strengthening exercises on 2 or more days a week.   Maintain a healthy weight. The body mass index (BMI) is a screening tool to identify possible weight problems. It provides an estimate of body fat based on height and weight. Your health care provider can find your BMI and can help you achieve or maintain a healthy weight. For males 20 years and older:  A BMI below 18.5 is considered underweight.  A BMI of 18.5 to 24.9 is normal.  A BMI of 25 to 29.9 is considered overweight.  A BMI of 30 and above is considered obese.  Maintain normal blood lipids and cholesterol by exercising and minimizing your intake of saturated fat. Eat a  balanced diet with plenty of fruits and vegetables. Blood tests for lipids and cholesterol should begin at age 72 and be repeated every 5 years. If your lipid or cholesterol levels are high, you are over age 57, or you are at high risk for heart disease, you may need your cholesterol levels checked more frequently.Ongoing high lipid and cholesterol levels should be treated with medicines if diet and exercise are not working.  If you smoke, find out from your health care provider how to quit. If you do not use tobacco, do not start.  Lung cancer screening is recommended for adults aged 26-80 years who are at high risk for developing lung cancer because of a history of smoking. A yearly low-dose CT scan of the lungs is recommended for people who have at least a 30-pack-year history of smoking and are current smokers or have quit within the past 15 years. A pack year of smoking is smoking an average of 1 pack of cigarettes a day for 1 year (for example, a 30-pack-year history of smoking could mean smoking 1 pack a day for 30 years or 2 packs a day for 15 years). Yearly screening should continue until the smoker has stopped smoking for at least 15 years. Yearly screening should be stopped for people who develop a health problem that would prevent them from having lung cancer treatment.  If you choose to drink alcohol, do not have more than 2 drinks per day. One drink is considered to be 12 oz (360 mL) of beer, 5 oz (150 mL) of wine, or 1.5 oz (45 mL) of  liquor.  Avoid the use of street drugs. Do not share needles with anyone. Ask for help if you need support or instructions about stopping the use of drugs.  High blood pressure causes heart disease and increases the risk of stroke. High blood pressure is more likely to develop in:  People who have blood pressure in the end of the normal range (100-139/85-89 mm Hg).  People who are overweight or obese.  People who are African American.  If you are 73-40  years of age, have your blood pressure checked every 3-5 years. If you are 2 years of age or older, have your blood pressure checked every year. You should have your blood pressure measured twice--once when you are at a hospital or clinic, and once when you are not at a hospital or clinic. Record the average of the two measurements. To check your blood pressure when you are not at a hospital or clinic, you can use:  An automated blood pressure machine at a pharmacy.  A home blood pressure monitor.  If you are 36-35 years old, ask your health care provider if you should take aspirin to prevent heart disease.  Diabetes screening involves taking a blood sample to check your fasting blood sugar level. This should be done once every 3 years after age 14 if you are at a normal weight and without risk factors for diabetes. Testing should be considered at a younger age or be carried out more frequently if you are overweight and have at least 1 risk factor for diabetes.  Colorectal cancer can be detected and often prevented. Most routine colorectal cancer screening begins at the age of 28 and continues through age 80. However, your health care provider may recommend screening at an earlier age if you have risk factors for colon cancer. On a yearly basis, your health care provider may provide home test kits to check for hidden blood in the stool. A small camera at the end of a tube may be used to directly examine the colon (sigmoidoscopy or colonoscopy) to detect the earliest forms of colorectal cancer. Talk to your health care provider about this at age 35 when routine screening begins. A direct exam of the colon should be repeated every 5-10 years through age 45, unless early forms of precancerous polyps or small growths are found.  People who are at an increased risk for hepatitis B should be screened for this virus. You are considered at high risk for hepatitis B if:  You were born in a country where  hepatitis B occurs often. Talk with your health care provider about which countries are considered high risk.  Your parents were born in a high-risk country and you have not received a shot to protect against hepatitis B (hepatitis B vaccine).  You have HIV or AIDS.  You use needles to inject street drugs.  You live with, or have sex with, someone who has hepatitis B.  You are a man who has sex with other men (MSM).  You get hemodialysis treatment.  You take certain medicines for conditions like cancer, organ transplantation, and autoimmune conditions.  Hepatitis C blood testing is recommended for all people born from [birth year] through 1966 and any individual with known risk factors for hepatitis C.  Healthy men should no longer receive prostate-specific antigen (PSA) blood tests as part of routine cancer screening. Talk to your health care provider about prostate cancer screening.  Testicular cancer screening is not recommended for adolescents or adult  males who have no symptoms. Screening includes self-exam, a health care provider exam, and other screening tests. Consult with your health care provider about any symptoms you have or any concerns you have about testicular cancer.  Practice safe sex. Use condoms and avoid high-risk sexual practices to reduce the spread of sexually transmitted infections (STIs).  You should be screened for STIs, including gonorrhea and chlamydia if:  You are sexually active and are younger than 24 years.  You are older than 24 years, and your health care provider tells you that you are at risk for this type of infection.  Your sexual activity has changed since you were last screened, and you are at an increased risk for chlamydia or gonorrhea. Ask your health care provider if you are at risk.  If you are at risk of being infected with HIV, it is recommended that you take a prescription medicine daily to prevent HIV infection. This is called pre-exposure  prophylaxis (PrEP). You are considered at risk if:  You are a man who has sex with other men (MSM).  You are a heterosexual man who is sexually active with multiple partners.  You take drugs by injection.  You are sexually active with a partner who has HIV.  Talk with your health care provider about whether you are at high risk of being infected with HIV. If you choose to begin PrEP, you should first be tested for HIV. You should then be tested every 3 months for as long as you are taking PrEP.  Use sunscreen. Apply sunscreen liberally and repeatedly throughout the day. You should seek shade when your shadow is shorter than you. Protect yourself by wearing long sleeves, pants, a wide-brimmed hat, and sunglasses year round whenever you are outdoors.  Tell your health care provider of new moles or changes in moles, especially if there is a change in shape or color. Also, tell your health care provider if a mole is larger than the size of a pencil eraser.  A one-time screening for abdominal aortic aneurysm (AAA) and surgical repair of large AAAs by ultrasound is recommended for men aged 8-75 years who are current or former smokers.  Stay current with your vaccines (immunizations).   This information is not intended to replace advice given to you by your health care provider. Make sure you discuss any questions you have with your health care provider.   Document Released: 11/23/2007 Document Revised: 06/17/2014 Document Reviewed: 10/22/2010 Elsevier Interactive Patient Education [employer].

## 2015-07-24 NOTE — Assessment & Plan Note (Signed)
Preventative protocols reviewed and updated unless pt declined. Discussed healthy diet and lifestyle.  

## 2015-07-24 NOTE — Progress Notes (Signed)
Pre visit review using our clinic review tool, if applicable. No additional management support is needed unless otherwise documented below in the visit note. 

## 2015-07-24 NOTE — Assessment & Plan Note (Signed)
LDL too high - start atorvastatin 40mg  daily. Discussed side effects to monitor. RTC 6 mo f/u visit.

## 2015-07-24 NOTE — Assessment & Plan Note (Signed)
No ventral hernia appreciated today - actually sxs improved with healthier diet changes. ?IBS - suggested decrease gas producing foods, avoid lactose containing foods, trial probiotic or trial beano. Update if not improved with this.

## 2015-07-28 ENCOUNTER — Encounter: Payer: Self-pay | Admitting: Internal Medicine

## 2015-07-28 ENCOUNTER — Other Ambulatory Visit (INDEPENDENT_AMBULATORY_CARE_PROVIDER_SITE_OTHER): Payer: 59

## 2015-07-28 DIAGNOSIS — R6882 Decreased libido: Secondary | ICD-10-CM | POA: Diagnosis not present

## 2015-07-28 NOTE — Addendum Note (Signed)
Addended by: Marchia Bond on: 07/28/2015 09:07 AM   Modules accepted: Orders

## 2015-07-29 LAB — TESTOSTERONE: Testosterone: 225 ng/dL — ABNORMAL LOW (ref 348–1197)

## 2015-08-02 ENCOUNTER — Encounter: Payer: Self-pay | Admitting: Family Medicine

## 2015-08-02 ENCOUNTER — Other Ambulatory Visit: Payer: Self-pay | Admitting: Family Medicine

## 2015-08-02 DIAGNOSIS — R7989 Other specified abnormal findings of blood chemistry: Secondary | ICD-10-CM

## 2015-08-02 DIAGNOSIS — E291 Testicular hypofunction: Secondary | ICD-10-CM | POA: Insufficient documentation

## 2015-08-08 ENCOUNTER — Other Ambulatory Visit (INDEPENDENT_AMBULATORY_CARE_PROVIDER_SITE_OTHER): Payer: 59

## 2015-08-08 DIAGNOSIS — R7989 Other specified abnormal findings of blood chemistry: Secondary | ICD-10-CM

## 2015-08-08 DIAGNOSIS — E291 Testicular hypofunction: Secondary | ICD-10-CM | POA: Diagnosis not present

## 2015-08-09 LAB — TESTOSTERONE,FREE AND TOTAL
TESTOSTERONE FREE: 3.8 pg/mL — AB (ref 7.2–24.0)
Testosterone: 148 ng/dL — ABNORMAL LOW (ref 348–1197)

## 2015-08-09 LAB — LUTEINIZING HORMONE: LH: 6.4 m[IU]/mL (ref 1.7–8.6)

## 2015-08-09 LAB — IRON AND TIBC
IRON SATURATION: 20 % (ref 15–55)
Iron: 83 ug/dL (ref 38–169)
TIBC: 418 ug/dL (ref 250–450)
UIBC: 335 ug/dL (ref 111–343)

## 2015-08-09 LAB — FOLLICLE STIMULATING HORMONE: FSH: 8.8 m[IU]/mL (ref 1.5–12.4)

## 2015-08-12 ENCOUNTER — Encounter: Payer: Self-pay | Admitting: Family Medicine

## 2015-08-25 ENCOUNTER — Encounter: Payer: Self-pay | Admitting: Family Medicine

## 2015-08-25 ENCOUNTER — Telehealth: Payer: Self-pay | Admitting: *Deleted

## 2015-08-25 DIAGNOSIS — E291 Testicular hypofunction: Secondary | ICD-10-CM

## 2015-08-25 NOTE — Telephone Encounter (Signed)
Please see previous My chart message

## 2015-08-25 NOTE — Telephone Encounter (Signed)
Pt sent Mychart message requesting testosterone replacement patch. (see lab results) I'm not familiar with a patch as much as the injections or cream. Could you take a look at this in Dr. Synthia Innocent absence and let me know what to send in for him? Thanks!

## 2015-08-25 NOTE — Telephone Encounter (Signed)
That needs to wait for Dr Darnell Level when he returns

## 2015-08-26 MED ORDER — TESTOSTERONE 2 MG/24HR TD PT24: 2.0000 mg | MEDICATED_PATCH | Freq: Every day | TRANSDERMAL | Status: DC

## 2015-08-26 MED ORDER — TESTOSTERONE 2 MG/24HR TD PT24: 1.0000 | MEDICATED_PATCH | Freq: Every day | TRANSDERMAL | Status: DC

## 2015-08-26 NOTE — Telephone Encounter (Signed)
Price out androderm patch - one onto skin daily. Rx sent to pharmacy. Return in 1 month for 8am rpt lab testing.

## 2015-08-28 ENCOUNTER — Telehealth: Payer: Self-pay | Admitting: *Deleted

## 2015-08-28 NOTE — Telephone Encounter (Signed)
PA approved. Pharmacy notified 

## 2015-08-28 NOTE — Telephone Encounter (Signed)
PA required for Androderm patch. Completed on Cover My Meds. Will await determination.

## 2015-08-28 NOTE — Telephone Encounter (Signed)
Rx called in as directed and patient notified.  

## 2015-08-29 NOTE — Telephone Encounter (Signed)
Anna at [facility] left v/m; Vicente Males said cannot dispense 60 day supply for retail and testosterone 2 mg only comes in box of # 60; the 4 mg comes in box of # 30 and [facility] M[facility] wants to know if can substitute. Dr Darnell Level out of office.

## 2015-08-30 NOTE — Telephone Encounter (Signed)
Dr. Darnell Level ordered 2 gm not 4 gm. Can they give him #30, with 1 refill

## 2015-08-30 NOTE — Telephone Encounter (Signed)
Pharmacy notified and will stress to patient to only use one patch.

## 2015-08-30 NOTE — Telephone Encounter (Signed)
Yes we can do that, as long as patient understands to use 1 patch daily

## 2015-08-30 NOTE — Telephone Encounter (Signed)
Spoke with Vic[facility] Males. She said they can't split the 2mg  box because it's a controlled med. She understands not being able to change him to 4mg , however, she was asking if she could have the SIG changed to say "use one patch for 2 weeks, then increase to 2 patches" to get it covered for the #60 box and then just tell the patient to only use one patch at night like originally written. The 4mg  patch comes #30 only and the 2mg  patch comes #60 only. There just isn't a way to split it.

## 2015-09-09 HISTORY — PX: COLONOSCOPY: SHX174

## 2015-09-13 ENCOUNTER — Ambulatory Visit (AMBULATORY_SURGERY_CENTER): Payer: Self-pay

## 2015-09-13 VITALS — Ht 73.0 in | Wt 235.0 lb

## 2015-09-13 DIAGNOSIS — Z83719 Family history of colon polyps, unspecified: Secondary | ICD-10-CM

## 2015-09-13 DIAGNOSIS — Z8371 Family history of colonic polyps: Secondary | ICD-10-CM

## 2015-09-13 MED ORDER — SUPREP BOWEL PREP KIT 17.5-3.13-1.6 GM/177ML PO SOLN: 1.0000 | Freq: Once | ORAL | Status: DC

## 2015-09-13 NOTE — Progress Notes (Signed)
No allergies to eggs or sy No past exposure to anesthesia No diet/weight loss meds No home oxygen  Has email and internet; registered for emmi

## 2015-09-27 ENCOUNTER — Ambulatory Visit (AMBULATORY_SURGERY_CENTER): Payer: 59 | Admitting: Internal Medicine

## 2015-09-27 ENCOUNTER — Encounter: Payer: Self-pay | Admitting: Internal Medicine

## 2015-09-27 VITALS — BP 113/66 | HR 58 | Temp 97.8°F | Resp 20 | Ht 72.0 in | Wt 230.0 lb

## 2015-09-27 DIAGNOSIS — D128 Benign neoplasm of rectum: Secondary | ICD-10-CM

## 2015-09-27 DIAGNOSIS — Z1211 Encounter for screening for malignant neoplasm of colon: Secondary | ICD-10-CM

## 2015-09-27 DIAGNOSIS — K621 Rectal polyp: Secondary | ICD-10-CM | POA: Diagnosis not present

## 2015-09-27 DIAGNOSIS — Z8371 Family history of colonic polyps: Secondary | ICD-10-CM

## 2015-09-27 MED ORDER — SODIUM CHLORIDE 0.9 % IV SOLN: 500.0000 mL | INTRAVENOUS | Status: DC

## 2015-09-27 NOTE — Patient Instructions (Signed)
Handout given for Polyps.   YOU HAD AN ENDOSCOPIC PROCEDURE TODAY AT [facility]:   Refer to the procedure report that was given to you for any specific questions about what was found during the examination.  If the procedure report does not answer your questions, please call your gastroenterologist to clarify.  If you requested that your care partner not be given the details of your procedure findings, then the procedure report has been included in a sealed envelope for you to review at your convenience later.  YOU SHOULD EXPECT: Some feelings of bloating in the abdomen. Passage of more gas than usual.  Walking can help get rid of the air that was put into your GI tract during the procedure and reduce the bloating. If you had a lower endoscopy (such as a colonoscopy or flexible sigmoidoscopy) you may notice spotting of blood in your stool or on the toilet paper. If you underwent a bowel prep for your procedure, you may not have a normal bowel movement for a few days.  Please Note:  You might notice some irritation and congestion in your nose or some drainage.  This is from the oxygen used during your procedure.  There is no need for concern and it should clear up in a day or so.  SYMPTOMS TO REPORT IMMEDIATELY:   Following lower endoscopy (colonoscopy or flexible sigmoidoscopy):  Excessive amounts of blood in the stool  Significant tenderness or worsening of abdominal pains  Swelling of the abdomen that is new, acute  Fever of 100F or higher   For urgent or emergent issues, a gastroenterologist can be reached at any hour by calling [phone].   DIET: Your first meal following the procedure should be a small meal and then it is ok to progress to your normal diet. Heavy or fried foods are harder to digest and may make you feel nauseous or bloated.  Likewise, meals heavy in dairy and vegetables can increase bloating.  Drink plenty of fluids but you should avoid alcoholic  beverages for 24 hours.  ACTIVITY:  You should plan to take it easy for the rest of today and you should NOT DRIVE or use heavy machinery until tomorrow (because of the sedation medicines used during the test).    FOLLOW UP: Our staff will call the number listed on your records the next business day following your procedure to check on you and address any questions or concerns that you may have regarding the information given to you following your procedure. If we do not reach you, we will leave a message.  However, if you are feeling well and you are not experiencing any problems, there is no need to return our call.  We will assume that you have returned to your regular daily activities without incident.  If any biopsies were taken you will be contacted by phone or by letter within the next 1-3 weeks.  Please call us at [phone] if you have not heard about the biopsies in 3 weeks.    SIGNATURES/CONFIDENTIALITY: You and/or your care partner have signed paperwork which will be entered into your electronic medical record.  These signatures attest to the fact that that the information above on your After Visit Summary has been reviewed and is understood.  Full responsibility of the confidentiality of this discharge information lies with you and/or your care-partner.

## 2015-09-27 NOTE — Progress Notes (Signed)
Patient awakening,vss,report to rn 

## 2015-09-27 NOTE — Progress Notes (Signed)
Called to room to assist during endoscopic procedure.  Patient ID and intended procedure confirmed with present staff. Received instructions for my participation in the procedure from the performing physician.  

## 2015-09-27 NOTE — Op Note (Signed)
[facility] Patient Name: Gerald Collins Procedure Date: 09/27/2015 11:45 AM MRN: [id number] Endoscopist: Docia Chuck. Henrene Pastor , MD Age: 50 Date of Birth: [birth year] Gender: Male Procedure:                Colonoscopy, with snare polypectomy X one Indications:              Screening for colorectal malignant neoplasm Medicines:                Monitored Anesthesia Care Procedure:                Pre-Anesthesia Assessment:                           - Prior to the procedure, a History and Physical                            was performed, and patient medications and                            allergies were reviewed. The patient's tolerance of                            previous anesthesia was also reviewed. The risks                            and benefits of the procedure and the sedation                            options and risks were discussed with the patient.                            All questions were answered, and informed consent                            was obtained. Prior Anticoagulants: The patient has                            taken no previous anticoagulant or antiplatelet                            agents. ASA Grade Assessment: II - A patient with                            mild systemic disease. After reviewing the risks                            and benefits, the patient was deemed in                            satisfactory condition to undergo the procedure.                           After obtaining informed consent, the colonoscope  was passed under direct vision. Throughout the                            procedure, the patient's blood pressure, pulse, and                            oxygen saturations were monitored continuously. The                            Model CF-HQ190L [id number]) scope was introduced                            through the anus and advanced to the the cecum,                            identified by appendiceal  orifice and ileocecal                            valve. The ileocecal valve, appendiceal orifice,                            and rectum were photographed. The quality of the                            bowel preparation was excellent. The colonoscopy                            was performed without difficulty. The patient                            tolerated the procedure well. The bowel preparation                            used was SUPREP. Scope In: 11:58:09 AM Scope Out: 12:08:27 PM Scope Withdrawal Time: 0 hours 8 minutes 56 seconds  Total Procedure Duration: 0 hours 10 minutes 18 seconds  Findings:                 A 5 mm polyp was found in the rectum. The polyp was                            pedunculated. The polyp was removed with a cold                            snare. Resection and retrieval were complete.                           The exam was otherwise without abnormality on                            direct and retroflexion views. Complications:            No immediate complications. Estimated blood loss:  None. Estimated Blood Loss:     Estimated blood loss: none. Recommendation:           - Patient has a contact number available for                            emergencies. The signs and symptoms of potential                            delayed complications were discussed with the                            patient. Return to normal activities tomorrow.                            Written discharge instructions were provided to the                            patient.                           - Resume previous diet.                           - Continue present medications.                           - Repeat colonoscopy in 5-10 years for                            surveillance, pending path. Docia Chuck. Henrene Pastor, MD 09/27/2015 12:16:07 PM This report has been signed electronically. CC Letter to:             Ria Bush

## 2015-09-28 ENCOUNTER — Telehealth: Payer: Self-pay | Admitting: *Deleted

## 2015-09-28 NOTE — Telephone Encounter (Signed)
  Follow up Call-  Call back number 09/27/2015  Post procedure Call Back phone  # [phone] cell   Permission to leave phone message Yes     Patient questions:  Do you have a fever, pain , or abdominal swelling? No. Pain Score  0 *  Have you tolerated food without any problems? Yes.    Have you been able to return to your normal activities? Yes.    Do you have any questions about your discharge instructions: Diet   No. Medications  No. Follow up visit  No.  Do you have questions or concerns about your Care? No.  Actions: * If pain score is 4 or above: No action needed, pain <4.  "I"m fine" per pt.

## 2015-10-02 ENCOUNTER — Encounter: Payer: Self-pay | Admitting: Internal Medicine

## 2015-10-03 ENCOUNTER — Encounter: Payer: Self-pay | Admitting: Family Medicine

## 2016-07-09 ENCOUNTER — Encounter: Payer: Self-pay | Admitting: Family Medicine

## 2016-08-23 ENCOUNTER — Ambulatory Visit (INDEPENDENT_AMBULATORY_CARE_PROVIDER_SITE_OTHER): Payer: 59 | Admitting: Family Medicine

## 2016-08-23 ENCOUNTER — Encounter: Payer: Self-pay | Admitting: Family Medicine

## 2016-08-23 VITALS — BP 132/92 | HR 66 | Temp 98.4°F | Ht 72.0 in | Wt 231.1 lb

## 2016-08-23 DIAGNOSIS — E785 Hyperlipidemia, unspecified: Secondary | ICD-10-CM

## 2016-08-23 DIAGNOSIS — Z125 Encounter for screening for malignant neoplasm of prostate: Secondary | ICD-10-CM

## 2016-08-23 DIAGNOSIS — E291 Testicular hypofunction: Secondary | ICD-10-CM

## 2016-08-23 DIAGNOSIS — E119 Type 2 diabetes mellitus without complications: Secondary | ICD-10-CM | POA: Diagnosis not present

## 2016-08-23 DIAGNOSIS — B353 Tinea pedis: Secondary | ICD-10-CM | POA: Diagnosis not present

## 2016-08-23 DIAGNOSIS — I10 Essential (primary) hypertension: Secondary | ICD-10-CM | POA: Diagnosis not present

## 2016-08-23 DIAGNOSIS — Z0000 Encounter for general adult medical examination without abnormal findings: Secondary | ICD-10-CM

## 2016-08-23 DIAGNOSIS — E669 Obesity, unspecified: Secondary | ICD-10-CM

## 2016-08-23 DIAGNOSIS — Z23 Encounter for immunization: Secondary | ICD-10-CM

## 2016-08-23 MED ORDER — ATORVASTATIN CALCIUM 40 MG PO TABS: 40.0000 mg | ORAL_TABLET | Freq: Every day | ORAL | 3 refills | Status: DC

## 2016-08-23 MED ORDER — TESTOSTERONE 2 MG/24HR TD PT24: 1.0000 | MEDICATED_PATCH | Freq: Every day | TRANSDERMAL | 1 refills | Status: DC

## 2016-08-23 NOTE — Assessment & Plan Note (Signed)
Discussed foot care. rec lotrimin daily x 1 month and if no improvement then will start oral antifungal. Pt agrees.

## 2016-08-23 NOTE — Assessment & Plan Note (Signed)
Ran out of lipitor 3 months ago. Check FLP and restart lipitor 40mg  daily. Discussed importance of compliance with med.

## 2016-08-23 NOTE — Assessment & Plan Note (Signed)
Chronic. Update labs. Foot exam today. Encouraged he schedule eye exam as due.

## 2016-08-23 NOTE — Assessment & Plan Note (Signed)
Preventative protocols reviewed and updated unless pt declined. Discussed healthy diet and lifestyle.  

## 2016-08-23 NOTE — Patient Instructions (Addendum)
Labwork today.  Testosterone and lipitor refilled today.  Good to see you today, call us with questions. Schedule yearly eye exam.  For athlete's foot - pat dry between toes after showering, use over the counter lotrimin daily for a month. If no better, we will recommend oral antifungal pill.  Return in 1 month for lab visit only. Return in 4 months for follow up visit.   Health Maintenance, Male A healthy lifestyle and preventive care is important for your health and wellness. Ask your health care provider about what schedule of regular examinations is right for you. What should I know about weight and diet?  Eat a Healthy Diet  Eat plenty of vegetables, fruits, whole grains, low-fat dairy products, and lean protein.  Do not eat a lot of foods high in solid fats, added sugars, or salt. Maintain a Healthy Weight  Regular exercise can help you achieve or maintain a healthy weight. You should:  Do at least 150 minutes of exercise each week. The exercise should increase your heart rate and make you sweat (moderate-intensity exercise).  Do strength-training exercises at least twice a week. Watch Your Levels of Cholesterol and Blood Lipids  Have your blood tested for lipids and cholesterol every 5 years starting at 52 years of age. If you are at high risk for heart disease, you should start having your blood tested when you are 52 years old. You may need to have your cholesterol levels checked more often if:  Your lipid or cholesterol levels are high.  You are older than 52 years of age.  You are at high risk for heart disease. What should I know about cancer screening? Many types of cancers can be detected early and may often be prevented. Lung Cancer  You should be screened every year for lung cancer if:  You are a current smoker who has smoked for at least 30 years.  You are a former smoker who has quit within the past 15 years.  Talk to your health care provider about your  screening options, when you should start screening, and how often you should be screened. Colorectal Cancer  Routine colorectal cancer screening usually begins at 53 years of age and should be repeated every 5-10 years until you are 52 years old. You may need to be screened more often if early forms of precancerous polyps or small growths are found. Your health care provider may recommend screening at an earlier age if you have risk factors for colon cancer.  Your health care provider may recommend using home test kits to check for hidden blood in the stool.  A small camera at the end of a tube can be used to examine your colon (sigmoidoscopy or colonoscopy). This checks for the earliest forms of colorectal cancer. Prostate and Testicular Cancer  Depending on your age and overall health, your health care provider may do certain tests to screen for prostate and testicular cancer.  Talk to your health care provider about any symptoms or concerns you have about testicular or prostate cancer. Skin Cancer  Check your skin from head to toe regularly.  Tell your health care provider about any new moles or changes in moles, especially if:  There is a change in a mole's size, shape, or color.  You have a mole that is larger than a pencil eraser.  Always use sunscreen. Apply sunscreen liberally and repeat throughout the day.  Protect yourself by wearing long sleeves, pants, a wide-brimmed hat, and sunglasses  when outside. What should I know about heart disease, diabetes, and high blood pressure?  If you are 49-87 years of age, have your blood pressure checked every 3-5 years. If you are 3 years of age or older, have your blood pressure checked every year. You should have your blood pressure measured twice-once when you are at a hospital or clinic, and once when you are not at a hospital or clinic. Record the average of the two measurements. To check your blood pressure when you are not at a  hospital or clinic, you can use:  An automated blood pressure machine at a pharmacy.  A home blood pressure monitor.  Talk to your health care provider about your target blood pressure.  If you are between 42-71 years old, ask your health care provider if you should take aspirin to prevent heart disease.  Have regular diabetes screenings by checking your fasting blood sugar level.  If you are at a normal weight and have a low risk for diabetes, have this test once every three years after the age of 85.  If you are overweight and have a high risk for diabetes, consider being tested at a younger age or more often.  A one-time screening for abdominal aortic aneurysm (AAA) by ultrasound is recommended for men aged 22-75 years who are current or former smokers. What should I know about preventing infection? Hepatitis B  If you have a higher risk for hepatitis B, you should be screened for this virus. Talk with your health care provider to find out if you are at risk for hepatitis B infection. Hepatitis C  Blood testing is recommended for:  Everyone born from [birth year] through 1966.  Anyone with known risk factors for hepatitis C. Sexually Transmitted Diseases (STDs)  You should be screened each year for STDs including gonorrhea and chlamydia if:  You are sexually active and are younger than 51 years of age.  You are older than 52 years of age and your health care provider tells you that you are at risk for this type of infection.  Your sexual activity has changed since you were last screened and you are at an increased risk for chlamydia or gonorrhea. Ask your health care provider if you are at risk.  Talk with your health care provider about whether you are at high risk of being infected with HIV. Your health care provider may recommend a prescription medicine to help prevent HIV infection. What else can I do?  Schedule regular health, dental, and eye exams.  Stay current with your  vaccines (immunizations).  Do not use any tobacco products, such as cigarettes, chewing tobacco, and e-cigarettes. If you need help quitting, ask your health care provider.  Limit alcohol intake to no more than 2 drinks per day. One drink equals 12 ounces of beer, 5 ounces of wine, or 1 ounces of hard liquor.  Do not use street drugs.  Do not share needles.  Ask your health care provider for help if you need support or information about quitting drugs.  Tell your health care provider if you often feel depressed.  Tell your health care provider if you have ever been abused or do not feel safe at home. This information is not intended to replace advice given to you by your health care provider. Make sure you discuss any questions you have with your health care provider. Document Released: 11/23/2007 Document Revised: 01/24/2016 Document Reviewed: 02/28/2015 Elsevier Interactive Patient Education  2017 [employer].

## 2016-08-23 NOTE — Progress Notes (Signed)
BP (!) 132/92 (BP Location: Left Arm, Patient Position: Sitting, Cuff Size: Normal)   Pulse 66   Temp 98.4 F (36.9 C) (Oral)   Ht 6' (1.829 m)   Wt 231 lb 1.9 oz (104.8 kg)   SpO2 97%   BMI 31.35 kg/m    CC: CPE Subjective:    Patient ID: Gerald Collins, male    DOB: 1965/12/23, 51 y.o.   MRN: [id number]  HPI: Gerald Collins is a 51 y.o. male presenting on 08/23/2016 for Annual Exam   HLD - did not return for 6 mo f/u. Ran out of lipitor last month.  Diet controlled DM - did not return for 6 mo f/u. Doesn't check sugars.  Hypogonadism - found to have low testosterone levels, confirmed on rpt testing with free T 3.8. Started testosterone patches 2mg /24 hours but didn't return for f/u labs. Out over th epast month.   Diabetic Foot Exam - Simple   Simple Foot Form Diabetic Foot exam was performed with the following findings:  Yes 08/23/2016 10:04 AM  Visual Inspection See comments:  Yes Sensation Testing Intact to touch and monofilament testing bilaterally:  Yes Pulse Check Posterior Tibialis and Dorsalis pulse intact bilaterally:  Yes Comments Maceration interdigital webs throughout Scaling of bilateral soles moccasin distribution      Preventative: COLONOSCOPY 09/2015 hyperplastic polpy rpt 10 yrs Henrene Pastor) Prostate cancer - no fmhx. asxs. Requests screening.  Flu - doesn't receive  Td 2012, Tdap today 2018 Pneumovax - declines  Seat belt use discussed Sunscreen use discussed. No changing moles on skin Ex smoker Alcohol - sporadic  Caffeine: none Lives with wife Investment banker, corporate), 2 kids, no pets Lab Tech at [employer] Activity: 1-2 hours every morning combination of weights and aerobic exercise Diet: good water, fruits/vegetables daily  Relevant past medical, surgical, family and social history reviewed and updated as indicated. Interim medical history since our last visit reviewed. Allergies and medications reviewed and updated. Outpatient Medications Prior to Visit    Medication Sig Dispense Refill  . glucose blood test strip Test once daily as needed. (Patient not taking: Reported on 09/27/2015) 100 each 1  . atorvastatin (LIPITOR) 40 MG tablet Take 1 tablet (40 mg total) by mouth daily. 30 tablet 6  . Testosterone 2 MG/24HR PT24 Place 1 patch onto the skin at bedtime. 30 patch 1   No facility-administered medications prior to visit.      Per HPI unless specifically indicated in ROS section below Review of Systems  Constitutional: Negative for activity change, appetite change, chills, fatigue, fever and unexpected weight change.  HENT: Negative for hearing loss.   Eyes: Negative for visual disturbance.  Respiratory: Negative for cough, chest tightness, shortness of breath and wheezing.   Cardiovascular: Negative for chest pain, palpitations and leg swelling.  Gastrointestinal: Negative for abdominal distention, abdominal pain, blood in stool, constipation, diarrhea, nausea and vomiting.  Genitourinary: Negative for difficulty urinating and hematuria.  Musculoskeletal: Negative for arthralgias, myalgias and neck pain.  Skin: Negative for rash.  Neurological: Negative for dizziness, seizures, syncope and headaches.  Hematological: Negative for adenopathy. Does not bruise/bleed easily.  Psychiatric/Behavioral: Negative for dysphoric mood. The patient is not nervous/anxious.        Objective:    BP (!) 132/92 (BP Location: Left Arm, Patient Position: Sitting, Cuff Size: Normal)   Pulse 66   Temp 98.4 F (36.9 C) (Oral)   Ht 6' (1.829 m)   Wt 231 lb 1.9 oz (104.8 kg)  SpO2 97%   BMI 31.35 kg/m   Wt Readings from Last 3 Encounters:  08/23/16 231 lb 1.9 oz (104.8 kg)  09/27/15 230 lb (104.3 kg)  09/13/15 235 lb (106.6 kg)    Physical Exam  Constitutional: He is oriented to person, place, and time. He appears well-developed and well-nourished. No distress.  HENT:  Head: Normocephalic and atraumatic.  Right Ear: Hearing, tympanic membrane,  external ear and ear canal normal.  Left Ear: Hearing, tympanic membrane, external ear and ear canal normal.  Nose: Nose normal.  Mouth/Throat: Uvula is midline, oropharynx is clear and moist and mucous membranes are normal. No oropharyngeal exudate, posterior oropharyngeal edema or posterior oropharyngeal erythema.  Eyes: Conjunctivae and EOM are normal. Pupils are equal, round, and reactive to light. No scleral icterus.  Neck: Normal range of motion. Neck supple. No thyromegaly present.  Cardiovascular: Normal rate, regular rhythm, normal heart sounds and intact distal pulses.   No murmur heard. Pulses:      Radial pulses are 2+ on the right side, and 2+ on the left side.  Pulmonary/Chest: Effort normal and breath sounds normal. No respiratory distress. He has no wheezes. He has no rales.  Abdominal: Soft. Bowel sounds are normal. He exhibits no distension and no mass. There is no tenderness. There is no rebound and no guarding.  Genitourinary: Rectum normal and prostate normal. Rectal exam shows no external hemorrhoid, no internal hemorrhoid, no fissure, no mass, no tenderness and anal tone normal. Prostate is not enlarged (15gm) and not tender.  Musculoskeletal: Normal range of motion. He exhibits no edema.  Lymphadenopathy:    He has no cervical adenopathy.  Neurological: He is alert and oriented to person, place, and time.  CN grossly intact, station and gait intact  Skin: Skin is warm and dry. No rash noted.  Chronic dark nevus R palm  Psychiatric: He has a normal mood and affect. His behavior is normal. Judgment and thought content normal.  Nursing note and vitals reviewed.  Results for orders placed or performed in visit on 76/19/50  Follicle stimulating hormone  Result Value Ref Range   FSH 8.8 1.5 - 12.4 mIU/mL  Luteinizing hormone  Result Value Ref Range   LH 6.4 1.7 - 8.6 mIU/mL  Iron and TIBC  Result Value Ref Range   Total Iron Binding Capacity 418 250 - 450 ug/dL    UIBC 335 111 - 343 ug/dL   Iron 83 38 - 169 ug/dL   Iron Saturation 20 15 - 55 %  Testosterone,Free and Total  Result Value Ref Range   Testosterone 148 (L) 348 - 1,197 ng/dL   Comment, Testosterone Comment    Testosterone, Free 3.8 (L) 7.2 - 24.0 pg/mL   Lab Results  Component Value Date   CHOL 273 (H) 07/17/2015   HDL 45.80 07/17/2015   LDLCALC 197 (H) 07/17/2015   LDLDIRECT 117 11/08/2010   TRIG 147.0 07/17/2015   CHOLHDL 6 07/17/2015    Lab Results  Component Value Date   HGBA1C 6.5 07/17/2015       Assessment & Plan:   Problem List Items Addressed This Visit    Diet-controlled diabetes mellitus ([place])    Chronic. Update labs. Foot exam today. Encouraged he schedule eye exam as due.       Relevant Medications   atorvastatin (LIPITOR) 40 MG tablet   Other Relevant Orders   Hemoglobin A1c   Microalbumin / creatinine urine ratio   Dyslipidemia    Ran out  of lipitor 3 months ago. Check FLP and restart lipitor 40mg  daily. Discussed importance of compliance with med.       Relevant Medications   atorvastatin (LIPITOR) 40 MG tablet   Other Relevant Orders   Lipid panel   Comprehensive metabolic panel   Essential hypertension    Chronic, mildly elevated today - pt endorses better control at home. Recheck at f/u visit.      Relevant Medications   atorvastatin (LIPITOR) 40 MG tablet   Health care maintenance - Primary    Preventative protocols reviewed and updated unless pt declined. Discussed healthy diet and lifestyle.       Hypogonadism in male    Update testosterone levels. Testosterone refilled today. RTC 1 mo testosterone labs, 73mo f/u visit.       Relevant Orders   Testos,Total,Free and SHBG (Male)   Obesity, Class I, BMI 30-34.9    Discussed healthy diet and lifestyle changes to affect sustainable weight loss.       Tinea pedis of both feet    Discussed foot care. rec lotrimin daily x 1 month and if no improvement then will start oral antifungal.  Pt agrees.        Other Visit Diagnoses    Special screening for malignant neoplasm of prostate       Relevant Orders   PSA       Follow up plan: Return in about 4 months (around 12/23/2016) for follow up visit.  Ria Bush, MD

## 2016-08-23 NOTE — Assessment & Plan Note (Signed)
Update testosterone levels. Testosterone refilled today. RTC 1 mo testosterone labs, 4mo f/u visit.

## 2016-08-23 NOTE — Addendum Note (Signed)
Addended by: Elmon Kirschner A on: 08/23/2016 10:38 AM   Modules accepted: Orders

## 2016-08-23 NOTE — Addendum Note (Signed)
Addended by: Ellamae Sia on: 08/23/2016 10:29 AM   Modules accepted: Orders

## 2016-08-23 NOTE — Assessment & Plan Note (Signed)
Discussed healthy diet and lifestyle changes to affect sustainable weight loss  

## 2016-08-23 NOTE — Progress Notes (Signed)
Pre visit review using our clinic review tool, if applicable. No additional management support is needed unless otherwise documented below in the visit note. 

## 2016-08-23 NOTE — Assessment & Plan Note (Signed)
Chronic, mildly elevated today - pt endorses better control at home. Recheck at f/u visit.

## 2016-08-27 LAB — COMPREHENSIVE METABOLIC PANEL
ALT: 32 IU/L (ref 0–44)
AST: 24 IU/L (ref 0–40)
Albumin/Globulin Ratio: 2 (ref 1.2–2.2)
Albumin: 4.7 g/dL (ref 3.5–5.5)
Alkaline Phosphatase: 65 IU/L (ref 39–117)
BUN / CREAT RATIO: 9 (ref 9–20)
BUN: 11 mg/dL (ref 6–24)
Bilirubin Total: 0.4 mg/dL (ref 0.0–1.2)
CALCIUM: 9.8 mg/dL (ref 8.7–10.2)
CO2: 27 mmol/L (ref 18–29)
CREATININE: 1.18 mg/dL (ref 0.76–1.27)
Chloride: 101 mmol/L (ref 96–106)
GFR, EST AFRICAN AMERICAN: 82 mL/min/{1.73_m2} (ref >59)
GFR, EST NON AFRICAN AMERICAN: 71 mL/min/{1.73_m2} (ref >59)
GLOBULIN, TOTAL: 2.3 g/dL (ref 1.5–4.5)
GLUCOSE: 103 mg/dL — AB (ref 65–99)
Potassium: 4.4 mmol/L (ref 3.5–5.2)
Sodium: 142 mmol/L (ref 134–144)
TOTAL PROTEIN: 7 g/dL (ref 6.0–8.5)

## 2016-08-27 LAB — HEMOGLOBIN A1C
ESTIMATED AVERAGE GLUCOSE: 134 mg/dL
HEMOGLOBIN A1C: 6.3 % — AB (ref 4.8–5.6)

## 2016-08-27 LAB — MICROALBUMIN / CREATININE URINE RATIO
Creatinine, Urine: 167.9 mg/dL
Microalb/Creat Ratio: <1.8 mg/g creat (ref 0.0–30.0)
Microalbumin, Urine: <3 ug/mL

## 2016-08-27 LAB — TESTOSTERONE, FREE, TOTAL, SHBG
SEX HORMONE BINDING: 31.3 nmol/L (ref 19.3–76.4)
TESTOSTERONE: 323 ng/dL (ref 264–916)
Testosterone, Free: 7.6 pg/mL (ref 7.2–24.0)

## 2016-08-27 LAB — T4, FREE: Free T4: 1.09 ng/dL (ref 0.82–1.77)

## 2016-08-27 LAB — LIPID PANEL
CHOL/HDL RATIO: 4.7 ratio (ref 0.0–5.0)
CHOLESTEROL TOTAL: 221 mg/dL — AB (ref 100–199)
HDL: 47 mg/dL (ref >39)
LDL CALC: 142 mg/dL — AB (ref 0–99)
Triglycerides: 159 mg/dL — ABNORMAL HIGH (ref 0–149)
VLDL CHOLESTEROL CAL: 32 mg/dL (ref 5–40)

## 2016-08-27 LAB — PROLACTIN: Prolactin: 9 ng/mL (ref 4.0–15.2)

## 2016-08-27 LAB — CORTISOL-AM, BLOOD: Cortisol - AM: 13.8 ug/dL (ref 6.2–19.4)

## 2016-08-27 LAB — PSA: PROSTATE SPECIFIC AG, SERUM: 0.2 ng/mL (ref 0.0–4.0)

## 2016-08-30 ENCOUNTER — Telehealth: Payer: Self-pay | Admitting: *Deleted

## 2016-08-30 NOTE — Telephone Encounter (Signed)
PA required for Androderm patch. Insurance will not cover until patient tries and fails Depo-testosterone and Delatestryl injections.

## 2016-08-31 NOTE — Telephone Encounter (Signed)
Notified pt via mychart (see recent lab results). Offered holding T replacement for now given recent normal readings vs transition to testosterone shots.  Will await pt decision.

## 2017-08-01 ENCOUNTER — Telehealth: Payer: 59 | Admitting: Family

## 2017-08-01 DIAGNOSIS — J329 Chronic sinusitis, unspecified: Secondary | ICD-10-CM

## 2017-08-01 DIAGNOSIS — B9689 Other specified bacterial agents as the cause of diseases classified elsewhere: Secondary | ICD-10-CM | POA: Diagnosis not present

## 2017-08-01 MED ORDER — AMOXICILLIN-POT CLAVULANATE 875-125 MG PO TABS: 1.0000 | ORAL_TABLET | Freq: Two times a day (BID) | ORAL | 0 refills | Status: AC

## 2017-08-01 NOTE — Progress Notes (Signed)

## 2017-10-08 ENCOUNTER — Encounter: Payer: Self-pay | Admitting: Family Medicine

## 2017-10-08 ENCOUNTER — Ambulatory Visit (INDEPENDENT_AMBULATORY_CARE_PROVIDER_SITE_OTHER): Payer: 59 | Admitting: Family Medicine

## 2017-10-08 VITALS — BP 124/80 | HR 67 | Temp 98.3°F | Ht 71.75 in | Wt 238.8 lb

## 2017-10-08 DIAGNOSIS — K644 Residual hemorrhoidal skin tags: Secondary | ICD-10-CM | POA: Insufficient documentation

## 2017-10-08 DIAGNOSIS — Z0000 Encounter for general adult medical examination without abnormal findings: Secondary | ICD-10-CM

## 2017-10-08 DIAGNOSIS — E291 Testicular hypofunction: Secondary | ICD-10-CM

## 2017-10-08 DIAGNOSIS — E119 Type 2 diabetes mellitus without complications: Secondary | ICD-10-CM | POA: Diagnosis not present

## 2017-10-08 DIAGNOSIS — Z125 Encounter for screening for malignant neoplasm of prostate: Secondary | ICD-10-CM | POA: Diagnosis not present

## 2017-10-08 DIAGNOSIS — E669 Obesity, unspecified: Secondary | ICD-10-CM

## 2017-10-08 DIAGNOSIS — E785 Hyperlipidemia, unspecified: Secondary | ICD-10-CM | POA: Diagnosis not present

## 2017-10-08 MED ORDER — HYDROCORTISONE ACETATE 25 MG RE SUPP: 25.0000 mg | Freq: Two times a day (BID) | RECTAL | 0 refills | Status: DC | PRN

## 2017-10-08 NOTE — Assessment & Plan Note (Signed)
Inflamed, but improving. Discussed supportive care with sitz baths. Anusol suppository sent in PRN. Update if not improving.

## 2017-10-08 NOTE — Assessment & Plan Note (Signed)
Chronic, off meds this past year. Will update FLP then consider restarting statin. The 10-year ASCVD risk score Mikey Bussing DC Brooke Bonito., et al., 2013) is: 10.7%   Values used to calculate the score:     Age: 52 years     Sex: Male     Is Non-Hispanic African American: Yes     Diabetic: Yes     Tobacco smoker: No     Systolic Blood Pressure: 494 mmHg     Is BP treated: No     HDL Cholesterol: 47 mg/dL     Total Cholesterol: 221 mg/dL

## 2017-10-08 NOTE — Addendum Note (Signed)
Addended by: Ellamae Sia on: 10/08/2017 11:36 AM   Modules accepted: Orders

## 2017-10-08 NOTE — Progress Notes (Signed)
BP 124/80 (BP Location: Left Arm, Patient Position: Sitting, Cuff Size: Large)   Pulse 67   Temp 98.3 F (36.8 C) (Oral)   Ht 5' 11.75" (1.822 m)   Wt 238 lb 12 oz (108.3 kg)   SpO2 98%   BMI 32.61 kg/m    CC: CPE Subjective:    Patient ID: Gerald Collins, male    DOB: [birth year], 52 y.o.   MRN: [id number]  HPI: Gerald Collins is a 52 y.o. male presenting on 10/08/2017 for Annual Exam (Wants testosterone checked. Would like inj since patches are not covered by insurance.)   Injured bilateral achilles at insertion while playing basketball during cruise last June. Less active this year because of this.   HLD - has not been taking lipitor.  Hypogonadism - not taking testosterone supplementation. Was on patch. Will check today. Recent rough bike ride - 3d h/o perianal discomfort, slowly improving however. No blood in stool.   Preventative: COLONOSCOPY 09/2015 hyperplastic polpy rpt 10 yrs Henrene Pastor) Prostate cancer - no fmhx. asxs. Requests screening.  Flu - doesn't receive  Td 2012, Tdap 2018 Pneumovax - declines  Seat belt use discussed Sunscreen use discussed. No changing moles on skin Sees dentist Q32mo Due for eye exam - wants to change eye doctors (prior saw [facility]).  Ex smoker Alcohol - sporadic  Caffeine: none Lives with wife Investment banker, corporate), 2 kids, no pets Lab Tech at [employer] Activity: no regular exercise Diet: good water, fruits/vegetables daily  Relevant past medical, surgical, family and social history reviewed and updated as indicated. Interim medical history since our last visit reviewed. Allergies and medications reviewed and updated. Outpatient Medications Prior to Visit  Medication Sig Dispense Refill  . atorvastatin (LIPITOR) 40 MG tablet Take 1 tablet (40 mg total) by mouth daily. 90 tablet 3  . glucose blood test strip Test once daily as needed. (Patient not taking: Reported on 09/27/2015) 100 each 1  . Testosterone 2 MG/24HR PT24 Place 1 patch onto the skin  at bedtime. 30 patch 1   No facility-administered medications prior to visit.      Per HPI unless specifically indicated in ROS section below Review of Systems  Constitutional: Negative for activity change, appetite change, chills, fatigue, fever and unexpected weight change.  HENT: Negative for hearing loss.   Eyes: Negative for visual disturbance.  Respiratory: Negative for cough, chest tightness, shortness of breath and wheezing.   Cardiovascular: Negative for chest pain, palpitations and leg swelling.  Gastrointestinal: Negative for abdominal distention, abdominal pain, blood in stool, constipation, diarrhea, nausea and vomiting.  Genitourinary: Negative for difficulty urinating and hematuria.  Musculoskeletal: Negative for arthralgias, myalgias and neck pain.  Skin: Negative for rash.  Neurological: Negative for dizziness, seizures, syncope and headaches.  Hematological: Negative for adenopathy. Does not bruise/bleed easily.  Psychiatric/Behavioral: Negative for dysphoric mood. The patient is not nervous/anxious.        Objective:    BP 124/80 (BP Location: Left Arm, Patient Position: Sitting, Cuff Size: Large)   Pulse 67   Temp 98.3 F (36.8 C) (Oral)   Ht 5' 11.75" (1.822 m)   Wt 238 lb 12 oz (108.3 kg)   SpO2 98%   BMI 32.61 kg/m   Wt Readings from Last 3 Encounters:  10/08/17 238 lb 12 oz (108.3 kg)  08/23/16 231 lb 1.9 oz (104.8 kg)  09/27/15 230 lb (104.3 kg)    Physical Exam  Constitutional: He is oriented to person, place, and time. He  appears well-developed and well-nourished. No distress.  HENT:  Head: Normocephalic and atraumatic.  Right Ear: Hearing, tympanic membrane, external ear and ear canal normal.  Left Ear: Hearing, tympanic membrane, external ear and ear canal normal.  Nose: Nose normal.  Mouth/Throat: Uvula is midline, oropharynx is clear and moist and mucous membranes are normal. No oropharyngeal exudate, posterior oropharyngeal edema or  posterior oropharyngeal erythema.  Eyes: Pupils are equal, round, and reactive to light. Conjunctivae and EOM are normal. No scleral icterus.  Neck: Normal range of motion. Neck supple. No thyromegaly present.  Cardiovascular: Normal rate, regular rhythm, normal heart sounds and intact distal pulses.  No murmur heard. Pulses:      Radial pulses are 2+ on the right side, and 2+ on the left side.  Pulmonary/Chest: Effort normal and breath sounds normal. No respiratory distress. He has no wheezes. He has no rales.  Abdominal: Soft. Bowel sounds are normal. He exhibits no distension and no mass. There is no tenderness. There is no rebound and no guarding.  Genitourinary: Prostate normal. Rectal exam shows internal hemorrhoid (tender R sided). Rectal exam shows no external hemorrhoid, no fissure, no mass, no tenderness and anal tone normal. Prostate is not enlarged (15gm) and not tender.  Musculoskeletal: Normal range of motion. He exhibits no edema.  Lymphadenopathy:    He has no cervical adenopathy.  Neurological: He is alert and oriented to person, place, and time.  CN grossly intact, station and gait intact  Skin: Skin is warm and dry. No rash noted.  Psychiatric: He has a normal mood and affect. His behavior is normal. Judgment and thought content normal.  Nursing note and vitals reviewed.  Results for orders placed or performed in visit on 08/23/16  Comprehensive metabolic panel  Result Value Ref Range   Glucose 103 (H) 65 - 99 mg/dL   BUN 11 6 - 24 mg/dL   Creatinine, Ser 1.18 0.76 - 1.27 mg/dL   GFR calc non Af Amer 71 >59 mL/min/1.73   GFR calc Af Amer 82 >59 mL/min/1.73   BUN/Creatinine Ratio 9 9 - 20   Sodium 142 134 - 144 mmol/L   Potassium 4.4 3.5 - 5.2 mmol/L   Chloride 101 96 - 106 mmol/L   CO2 27 18 - 29 mmol/L   Calcium 9.8 8.7 - 10.2 mg/dL   Total Protein 7.0 6.0 - 8.5 g/dL   Albumin 4.7 3.5 - 5.5 g/dL   Globulin, Total 2.3 1.5 - 4.5 g/dL   Albumin/Globulin Ratio 2.0  1.2 - 2.2   Bilirubin Total 0.4 0.0 - 1.2 mg/dL   Alkaline Phosphatase 65 39 - 117 IU/L   AST 24 0 - 40 IU/L   ALT 32 0 - 44 IU/L  Lipid panel  Result Value Ref Range   Cholesterol, Total 221 (H) 100 - 199 mg/dL   Triglycerides 159 (H) 0 - 149 mg/dL   HDL 47 >39 mg/dL   VLDL Cholesterol Cal 32 5 - 40 mg/dL   LDL Calculated 142 (H) 0 - 99 mg/dL   Chol/HDL Ratio 4.7 0.0 - 5.0 ratio units  Hemoglobin A1c  Result Value Ref Range   Hgb A1c MFr Bld 6.3 (H) 4.8 - 5.6 %   Est. average glucose Bld gHb Est-mCnc 134 mg/dL  Microalbumin / creatinine urine ratio  Result Value Ref Range   Creatinine, Urine 167.9 Not Estab. mg/dL   Microalbumin, Urine <3.0 Not Estab. ug/mL   Microalb/Creat Ratio <1.8 0.0 - 30.0 mg/g creat  PSA  Result Value Ref Range   Prostate Specific Ag, Serum 0.2 0.0 - 4.0 ng/mL  T4, free  Result Value Ref Range   Free T4 1.09 0.82 - 1.77 ng/dL  Cortisol-am, blood  Result Value Ref Range   Cortisol - AM 13.8 6.2 - 19.4 ug/dL  Prolactin  Result Value Ref Range   Prolactin 9.0 4.0 - 15.2 ng/mL  Testosterone, Free, Total, SHBG  Result Value Ref Range   Testosterone 323 264 - 916 ng/dL   Testosterone, Free 7.6 7.2 - 24.0 pg/mL   Sex Hormone Binding 31.3 19.3 - 76.4 nmol/L      Assessment & Plan:   Problem List Items Addressed This Visit    Diet-controlled diabetes mellitus (HCC)    Chronic, off meds. Update A1c today. Encouraged schedule eye exam. Last check in prediabetes range.       Relevant Orders   Hemoglobin A1c   Microalbumin / creatinine urine ratio   Dyslipidemia    Chronic, off meds this past year. Will update FLP then consider restarting statin. The 10-year ASCVD risk score Mikey Bussing DC Brooke Bonito., et al., 2013) is: 10.7%   Values used to calculate the score:     Age: 79 years     Sex: Male     Is Non-Hispanic African American: Yes     Diabetic: Yes     Tobacco smoker: No     Systolic Blood Pressure: 941 mmHg     Is BP treated: No     HDL Cholesterol:  47 mg/dL     Total Cholesterol: 221 mg/dL       Relevant Orders   Lipid panel   Comprehensive metabolic panel   External hemorrhoid    Inflamed, but improving. Discussed supportive care with sitz baths. Anusol suppository sent in PRN. Update if not improving.       Health care maintenance - Primary    Preventative protocols reviewed and updated unless pt declined. Discussed healthy diet and lifestyle.       Hypogonadism in male    Update testosterone levels.       Relevant Orders   Testos,Total,Free and SHBG (Male)   Obesity, Class I, BMI 30-34.9    Weight gain noted. Encouraged healthy diet and lifestyle changes for sustainable weight loss.        Other Visit Diagnoses    Special screening for malignant neoplasm of prostate       Relevant Orders   PSA       Meds ordered this encounter  Medications  . hydrocortisone (ANUSOL-HC) 25 MG suppository    Sig: Place 1 suppository (25 mg total) rectally 2 (two) times daily as needed for hemorrhoids.    Dispense:  6 suppository    Refill:  0   Orders Placed This Encounter  Procedures  . Lipid panel  . Comprehensive metabolic panel  . Hemoglobin A1c  . PSA  . Microalbumin / creatinine urine ratio  . Testos,Total,Free and SHBG (Male)    Follow up plan: Return in about 1 year (around 10/09/2018) for annual exam, prior fasting for blood work.  Ria Bush, MD

## 2017-10-08 NOTE — Assessment & Plan Note (Signed)
Weight gain noted. Encouraged healthy diet and lifestyle changes for sustainable weight loss.  

## 2017-10-08 NOTE — Assessment & Plan Note (Signed)
Update testosterone levels.

## 2017-10-08 NOTE — Assessment & Plan Note (Signed)
Chronic, off meds. Update A1c today. Encouraged schedule eye exam. Last check in prediabetes range.

## 2017-10-08 NOTE — Assessment & Plan Note (Signed)
Preventative protocols reviewed and updated unless pt declined. Discussed healthy diet and lifestyle.  

## 2017-10-08 NOTE — Patient Instructions (Addendum)
Labs today. We will be in touch with results and plan for meds.  Check into [facility]. I think you have internal hemorrhoid - if needed, may try anusol suppository as needed. May do warm sitz bath if needed as well.   Health Maintenance, Male A healthy lifestyle and preventive care is important for your health and wellness. Ask your health care provider about what schedule of regular examinations is right for you. What should I know about weight and diet? Eat a Healthy Diet  Eat plenty of vegetables, fruits, whole grains, low-fat dairy products, and lean protein.  Do not eat a lot of foods high in solid fats, added sugars, or salt.  Maintain a Healthy Weight Regular exercise can help you achieve or maintain a healthy weight. You should:  Do at least 150 minutes of exercise each week. The exercise should increase your heart rate and make you sweat (moderate-intensity exercise).  Do strength-training exercises at least twice a week.  Watch Your Levels of Cholesterol and Blood Lipids  Have your blood tested for lipids and cholesterol every 5 years starting at 53 years of age. If you are at high risk for heart disease, you should start having your blood tested when you are 53 years old. You may need to have your cholesterol levels checked more often if: ? Your lipid or cholesterol levels are high. ? You are older than 53 years of age. ? You are at high risk for heart disease.  What should I know about cancer screening? Many types of cancers can be detected early and may often be prevented. Lung Cancer  You should be screened every year for lung cancer if: ? You are a current smoker who has smoked for at least 30 years. ? You are a former smoker who has quit within the past 15 years.  Talk to your health care provider about your screening options, when you should start screening, and how often you should be screened.  Colorectal Cancer  Routine colorectal cancer screening  usually begins at 53 years of age and should be repeated every 5-10 years until you are 53 years old. You may need to be screened more often if early forms of precancerous polyps or small growths are found. Your health care provider may recommend screening at an earlier age if you have risk factors for colon cancer.  Your health care provider may recommend using home test kits to check for hidden blood in the stool.  A small camera at the end of a tube can be used to examine your colon (sigmoidoscopy or colonoscopy). This checks for the earliest forms of colorectal cancer.  Prostate and Testicular Cancer  Depending on your age and overall health, your health care provider may do certain tests to screen for prostate and testicular cancer.  Talk to your health care provider about any symptoms or concerns you have about testicular or prostate cancer.  Skin Cancer  Check your skin from head to toe regularly.  Tell your health care provider about any new moles or changes in moles, especially if: ? There is a change in a mole's size, shape, or color. ? You have a mole that is larger than a pencil eraser.  Always use sunscreen. Apply sunscreen liberally and repeat throughout the day.  Protect yourself by wearing long sleeves, pants, a wide-brimmed hat, and sunglasses when outside.  What should I know about heart disease, diabetes, and high blood pressure?  If you are 18-39 years of  age, have your blood pressure checked every 3-5 years. If you are 49 years of age or older, have your blood pressure checked every year. You should have your blood pressure measured twice-once when you are at a hospital or clinic, and once when you are not at a hospital or clinic. Record the average of the two measurements. To check your blood pressure when you are not at a hospital or clinic, you can use: ? An automated blood pressure machine at a pharmacy. ? A home blood pressure monitor.  Talk to your health care  provider about your target blood pressure.  If you are between 51-38 years old, ask your health care provider if you should take aspirin to prevent heart disease.  Have regular diabetes screenings by checking your fasting blood sugar level. ? If you are at a normal weight and have a low risk for diabetes, have this test once every three years after the age of 27. ? If you are overweight and have a high risk for diabetes, consider being tested at a younger age or more often.  A one-time screening for abdominal aortic aneurysm (AAA) by ultrasound is recommended for men aged 81-75 years who are current or former smokers. What should I know about preventing infection? Hepatitis B If you have a higher risk for hepatitis B, you should be screened for this virus. Talk with your health care provider to find out if you are at risk for hepatitis B infection. Hepatitis C Blood testing is recommended for:  Everyone born from [birth year] through 1966.  Anyone with known risk factors for hepatitis C.  Sexually Transmitted Diseases (STDs)  You should be screened each year for STDs including gonorrhea and chlamydia if: ? You are sexually active and are younger than 53 years of age. ? You are older than 53 years of age and your health care provider tells you that you are at risk for this type of infection. ? Your sexual activity has changed since you were last screened and you are at an increased risk for chlamydia or gonorrhea. Ask your health care provider if you are at risk.  Talk with your health care provider about whether you are at high risk of being infected with HIV. Your health care provider may recommend a prescription medicine to help prevent HIV infection.  What else can I do?  Schedule regular health, dental, and eye exams.  Stay current with your vaccines (immunizations).  Do not use any tobacco products, such as cigarettes, chewing tobacco, and e-cigarettes. If you need help quitting, ask  your health care provider.  Limit alcohol intake to no more than 2 drinks per day. One drink equals 12 ounces of beer, 5 ounces of wine, or 1 ounces of hard liquor.  Do not use street drugs.  Do not share needles.  Ask your health care provider for help if you need support or information about quitting drugs.  Tell your health care provider if you often feel depressed.  Tell your health care provider if you have ever been abused or do not feel safe at home. This information is not intended to replace advice given to you by your health care provider. Make sure you discuss any questions you have with your health care provider. Document Released: 11/23/2007 Document Revised: 01/24/2016 Document Reviewed: 02/28/2015 Elsevier Interactive Patient Education  [employer].

## 2017-10-09 LAB — COMPREHENSIVE METABOLIC PANEL
A/G RATIO: 2.2 (ref 1.2–2.2)
ALK PHOS: 65 IU/L (ref 39–117)
ALT: 42 IU/L (ref 0–44)
AST: 35 IU/L (ref 0–40)
Albumin: 4.8 g/dL (ref 3.5–5.5)
BILIRUBIN TOTAL: 0.4 mg/dL (ref 0.0–1.2)
BUN/Creatinine Ratio: 12 (ref 9–20)
BUN: 13 mg/dL (ref 6–24)
CALCIUM: 9.5 mg/dL (ref 8.7–10.2)
CHLORIDE: 103 mmol/L (ref 96–106)
CO2: 24 mmol/L (ref 20–29)
Creatinine, Ser: 1.09 mg/dL (ref 0.76–1.27)
GFR calc Af Amer: 90 mL/min/{1.73_m2} (ref >59)
GFR, EST NON AFRICAN AMERICAN: 78 mL/min/{1.73_m2} (ref >59)
Globulin, Total: 2.2 g/dL (ref 1.5–4.5)
Glucose: 119 mg/dL — ABNORMAL HIGH (ref 65–99)
POTASSIUM: 4.7 mmol/L (ref 3.5–5.2)
Sodium: 140 mmol/L (ref 134–144)
Total Protein: 7 g/dL (ref 6.0–8.5)

## 2017-10-09 LAB — LIPID PANEL
CHOL/HDL RATIO: 5.8 ratio — AB (ref 0.0–5.0)
Cholesterol, Total: 269 mg/dL — ABNORMAL HIGH (ref 100–199)
HDL: 46 mg/dL (ref >39)
LDL CALC: 181 mg/dL — AB (ref 0–99)
TRIGLYCERIDES: 210 mg/dL — AB (ref 0–149)
VLDL CHOLESTEROL CAL: 42 mg/dL — AB (ref 5–40)

## 2017-10-09 LAB — MICROALBUMIN / CREATININE URINE RATIO
Creatinine, Urine: 195.5 mg/dL
Microalb/Creat Ratio: <1.5 mg/g creat (ref 0.0–30.0)

## 2017-10-09 LAB — TESTOSTERONE, FREE, TOTAL, SHBG
SEX HORMONE BINDING: 24.4 nmol/L (ref 19.3–76.4)
TESTOSTERONE FREE: 4.2 pg/mL — AB (ref 7.2–24.0)
TESTOSTERONE: 193 ng/dL — AB (ref 264–916)

## 2017-10-09 LAB — HEMOGLOBIN A1C
Est. average glucose Bld gHb Est-mCnc: 157 mg/dL
Hgb A1c MFr Bld: 7.1 % — ABNORMAL HIGH (ref 4.8–5.6)

## 2017-10-09 LAB — PSA: Prostate Specific Ag, Serum: 0.3 ng/mL (ref 0.0–4.0)

## 2018-04-24 ENCOUNTER — Emergency Department
Admission: EM | Admit: 2018-04-24 | Discharge: 2018-04-24 | Disposition: A | Discharge status: Home / Self Care | Payer: 59 | Attending: Emergency Medicine | Admitting: Emergency Medicine

## 2018-04-24 ENCOUNTER — Other Ambulatory Visit: Payer: Self-pay

## 2018-04-24 ENCOUNTER — Encounter: Payer: Self-pay | Admitting: Emergency Medicine

## 2018-04-24 ENCOUNTER — Emergency Department: Payer: 59

## 2018-04-24 DIAGNOSIS — E119 Type 2 diabetes mellitus without complications: Secondary | ICD-10-CM | POA: Insufficient documentation

## 2018-04-24 DIAGNOSIS — I10 Essential (primary) hypertension: Secondary | ICD-10-CM | POA: Insufficient documentation

## 2018-04-24 DIAGNOSIS — Z91040 Latex allergy status: Secondary | ICD-10-CM | POA: Diagnosis not present

## 2018-04-24 DIAGNOSIS — E785 Hyperlipidemia, unspecified: Secondary | ICD-10-CM | POA: Insufficient documentation

## 2018-04-24 DIAGNOSIS — R200 Anesthesia of skin: Secondary | ICD-10-CM | POA: Insufficient documentation

## 2018-04-24 DIAGNOSIS — R202 Paresthesia of skin: Secondary | ICD-10-CM

## 2018-04-24 DIAGNOSIS — R42 Dizziness and giddiness: Secondary | ICD-10-CM | POA: Diagnosis not present

## 2018-04-24 DIAGNOSIS — Z87891 Personal history of nicotine dependence: Secondary | ICD-10-CM | POA: Insufficient documentation

## 2018-04-24 LAB — CBC
HEMATOCRIT: 44.3 % (ref 39.0–52.0)
HEMOGLOBIN: 14.3 g/dL (ref 13.0–17.0)
MCH: 27 pg (ref 26.0–34.0)
MCHC: 32.3 g/dL (ref 30.0–36.0)
MCV: 83.6 fL (ref 80.0–100.0)
NRBC: 0 % (ref 0.0–0.2)
Platelets: 167 10*3/uL (ref 150–400)
RBC: 5.3 MIL/uL (ref 4.22–5.81)
RDW: 12.6 % (ref 11.5–15.5)
WBC: 4.8 10*3/uL (ref 4.0–10.5)

## 2018-04-24 LAB — BASIC METABOLIC PANEL
ANION GAP: 6 (ref 5–15)
BUN: 12 mg/dL (ref 6–20)
CHLORIDE: 104 mmol/L (ref 98–111)
CO2: 30 mmol/L (ref 22–32)
Calcium: 9.7 mg/dL (ref 8.9–10.3)
Creatinine, Ser: 1.2 mg/dL (ref 0.61–1.24)
GFR calc non Af Amer: >60 mL/min (ref >60)
Glucose, Bld: 111 mg/dL — ABNORMAL HIGH (ref 70–99)
POTASSIUM: 3.9 mmol/L (ref 3.5–5.1)
SODIUM: 140 mmol/L (ref 135–145)

## 2018-04-24 LAB — TROPONIN I: Troponin I: <0.03 ng/mL (ref <0.03)

## 2018-04-24 NOTE — ED Notes (Signed)
Pt states he felt or heard a "pop" in his left chest while driving to work as well. NAD at this time.

## 2018-04-24 NOTE — ED Notes (Addendum)
FIRST NURSE NOTE:  Pt states he felt a "pop" around the left side of his chest and shoulder area then states his arm felt funny after that. Pt states the feeling has improved. Equal grip strengths, no facial asymmetry. Reports BP was elevated at home 756E systolic/ 332 diastolic. Pt is not currently on any medications for BP.

## 2018-04-24 NOTE — ED Notes (Signed)
FIRST NURSE NOTE:  Pt c/o elevated BP since about 1130-12pm today. No distress noted at this time.

## 2018-04-24 NOTE — ED Notes (Signed)
See triage note.Gerald Collins he does not have any pain, but left arm continues tofeel a little numb.  He says he just feels funny, but not now, only when he stands up.  In nad.

## 2018-04-24 NOTE — ED Triage Notes (Signed)
Pt states he became light headed around 1130am while driving to work, then a little later today, has had some tingling in his left arm, now states no tingling. Denies cp, states he checked his bp at work 161/106, usually has normal bp.

## 2018-04-24 NOTE — ED Provider Notes (Signed)
[facility] Emergency Department Provider Note       Time seen: ----------------------------------------- 4:55 PM on 04/24/2018 -----------------------------------------   I have reviewed the triage vital signs and the nursing notes.  HISTORY   Chief Complaint No chief complaint on file.    HPI Gerald Collins is a 52 y.o. male with a history of diabetes, hypertension although he does not take medicine for either and does not have high blood pressure normally or high blood sugar who presents to the ED for feeling a pop around his left chest and shoulder with associated left arm feeling funny after that.  Patient reports a funny feeling has improved but he does not feel right.  His blood pressure was elevated at work which was 161/106.  He denies fevers, chills, chest pain, shortness of breath, vomiting or diarrhea.  He denies any other symptoms.  Past Medical History:  Diagnosis Date  . Diabetes type 2, controlled ([place]) 2012   controlled with lifestyle (diet, exercise) change  . H/O: pneumonia 06/11/1987  . HLD (hyperlipidemia)   . HTN (hypertension)   . Other specified visual disturbances   . PNEUMONIA, HX OF 06/11/1987   Qualifier: Diagnosis of  By: Maxie Better FNP, Rosalita Levan     Patient Active Problem List   Diagnosis Date Noted  . External hemorrhoid 10/08/2017  . Hypogonadism in male 08/02/2015  . Obesity, Class I, BMI 30-34.9 07/24/2015  . Abdominal pain, epigastric 06/16/2015  . Health care maintenance 10/08/2013  . Tinea pedis of both feet 11/06/2010  . Dyslipidemia 08/07/2010  . Diet-controlled diabetes mellitus ([place]) 07/24/2010  . Essential hypertension 12/18/2006    Past Surgical History:  Procedure Laterality Date  . COLONOSCOPY  09/2015   hyperplastic polpy rpt 10 yrs Henrene Pastor)  . VASECTOMY  1998    Allergies Latex and Metformin  Social History Social History   Tobacco Use  . Smoking status: Former Smoker    Types: Cigars   . Smokeless tobacco: Never Used  . Tobacco comment: on occasion only-never regularly  Substance Use Topics  . Alcohol use: Yes    Alcohol/week: 0.0 standard drinks    Comment: Rare  . Drug use: No   Review of Systems Constitutional: Negative for fever. Cardiovascular: Negative for chest pain. Respiratory: Negative for shortness of breath. Gastrointestinal: Negative for abdominal pain, vomiting and diarrhea. Genitourinary: Negative for dysuria. Musculoskeletal: Negative for back pain. Skin: Negative for rash. Neurological: Negative for headaches, focal weakness or numbness.  Positive for left arm paresthesias  All systems negative/normal/unremarkable except as stated in the HPI  ____________________________________________   PHYSICAL EXAM:  VITAL SIGNS: ED Triage Vitals [04/24/18 1554]  Enc Vitals Group     BP      Pulse      Resp      Temp      Temp src      SpO2      Weight      Height      Head Circumference      Peak Flow      Pain Score 0     Pain Loc      Pain Edu?      Excl. in [place]?    Constitutional: Alert and oriented. Well appearing and in no distress. Eyes: Conjunctivae are normal. Normal extraocular movements. ENT   Head: Normocephalic and atraumatic.   Nose: No congestion/rhinnorhea.   Mouth/Throat: Mucous membranes are moist.   Neck: No stridor. Cardiovascular: Normal rate, regular rhythm.  No murmurs, rubs, or gallops. Respiratory: Normal respiratory effort without tachypnea nor retractions. Breath sounds are clear and equal bilaterally. No wheezes/rales/rhonchi. Gastrointestinal: Soft and nontender. Normal bowel sounds Musculoskeletal: Nontender with normal range of motion in extremities. No lower extremity tenderness nor edema. Neurologic:  Normal speech and language. No gross focal neurologic deficits are appreciated.  Skin:  Skin is warm, dry and intact. No rash noted. Psychiatric: Mood and affect are normal. Speech and behavior  are normal.  ____________________________________________  EKG: Interpreted by me.  Sinus rhythm rate 68 bpm, normal PR interval, normal QRS, normal QT.  ____________________________________________  ED COURSE:  As part of my medical decision making, I reviewed the following data within the [facility] History obtained from family if available, nursing notes, old chart and ekg, as well as notes from prior ED visits. Patient presented for paresthesias in the left arm with hypertension, we will assess with labs and imaging as indicated at this time.   Procedures ____________________________________________   LABS (pertinent positives/negatives)  Labs Reviewed  BASIC METABOLIC PANEL - Abnormal; Notable for the following components:      Result Value   Glucose, Bld 111 (*)    All other components within normal limits  CBC  TROPONIN I    RADIOLOGY  IMPRESSION: Negative CT head  ____________________________________________  DIFFERENTIAL DIAGNOSIS   Paresthesia, peripheral nerve issue, TIA unlikely, CVA unlikely, muscle spasm  FINAL ASSESSMENT AND PLAN  Paresthesia, hypertension   Plan: The patient had presented for left arm paresthesia. Patient's labs are reassuring. Patient's imaging are also reassuring.  Patient describes vague symptoms including left arm paresthesias overall weakness and was noted to have high blood pressure.  His blood pressure appears to be improving it could very well be episodic.  The pain that preceded the paresthesias makes me think it is more musculoskeletal.  He is been advised to return for worsening or worrisome symptoms.   Laurence Aly, MD   Note: This note was generated in part or whole with voice recognition software. Voice recognition is usually quite accurate but there are transcription errors that can and very often do occur. I apologize for any typographical errors that were not detected and corrected.      Earleen Newport, MD 04/24/18 [phone]

## 2018-04-26 NOTE — Progress Notes (Signed)
BP 134/86 (BP Location: Left Arm, Patient Position: Sitting, Cuff Size: Large)   Pulse 70   Temp 98.2 F (36.8 C) (Oral)   Ht 5' 11.75" (1.822 m)   Wt 235 lb 8 oz (106.8 kg)   SpO2 97%   BMI 32.16 kg/m    CC: ER f/u visit Subjective:    Patient ID: Gerald Collins, male    DOB: [birth year], 52 y.o.   MRN: [id number]  HPI: Gerald Collins is a 52 y.o. male presenting on 04/27/2018 for Hospitalization Follow-up (Seen at [facility] ED on 04/24/18. ) and Discuss Medication (Want to discuss Lipitor. )   Recent ER visit 04/24/2018 for elevated blood pressures in setting of hearing pop in L chest and shoulder with L arm pain (burning and tingling) while driving at work and hypertension to 160/100s. Records reviewed. He had stable EKG, troponin, CBC and BMP. CT head was normal as well.   Stayed lightheaded through weekend, felt better after exercising last 2 days.  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 Feels like a load off his chest.   Arm not bothering him anymore.  He has decided to start taking lipitor. Known HLD.  Never chest pain/pressure or tightness. No reproducible chest pain. No GERD symptoms.  Regular exercise routine - walking/running 5 mi/day.   No fmhx stroke.   Relevant past medical, surgical, family and social history reviewed and updated as indicated. Interim medical history since our last visit reviewed. Allergies and medications reviewed and updated. Outpatient Medications Prior to Visit  Medication Sig Dispense Refill  . atorvastatin (LIPITOR) 40 MG tablet Take 1 tablet (40 mg total) by mouth daily. (Patient not taking: Reported on 04/27/2018) 90 tablet 3  . hydrocortisone (ANUSOL-HC) 25 MG suppository Place 1 suppository (25 mg total) rectally 2 (two) times daily as needed for hemorrhoids. 6 suppository 0   No facility-administered medications prior to visit.      Per HPI unless specifically indicated in ROS  section below Review of Systems     Objective:    BP 134/86 (BP Location: Left Arm, Patient Position: Sitting, Cuff Size: Large)   Pulse 70   Temp 98.2 F (36.8 C) (Oral)   Ht 5' 11.75" (1.822 m)   Wt 235 lb 8 oz (106.8 kg)   SpO2 97%   BMI 32.16 kg/m   Wt Readings from Last 3 Encounters:  04/27/18 235 lb 8 oz (106.8 kg)  10/08/17 238 lb 12 oz (108.3 kg)  08/23/16 231 lb 1.9 oz (104.8 kg)    Physical Exam  Constitutional: He appears well-developed and well-nourished. No distress.  HENT:  Mouth/Throat: Oropharynx is clear and moist. No oropharyngeal exudate.  Eyes: Pupils are equal, round, and reactive to light. EOM are normal.  Cardiovascular: Normal rate, regular rhythm and normal heart sounds.  No murmur heard. Pulmonary/Chest: Effort normal and breath sounds normal. No respiratory distress. He has no wheezes. He has no rales. He exhibits no tenderness.  Musculoskeletal: Normal range of motion. He exhibits no edema.  FROM at shoulders No pain to palpation of shoulder, arm or chest wall landmarks  Neurological: He has normal strength. No sensory deficit. Coordination and gait normal.  CN 2-12 grossly intact Sensation intact to light touch BUE Neg spurling test 5/5 strength BUE Station and gait intact  Skin: Skin is warm and dry. No rash noted.  Psychiatric: He has a normal mood and affect.  Nursing  note and vitals reviewed.  Results for orders placed or performed during the hospital encounter of 02/72/53  Basic metabolic panel  Result Value Ref Range   Sodium 140 135 - 145 mmol/L   Potassium 3.9 3.5 - 5.1 mmol/L   Chloride 104 98 - 111 mmol/L   CO2 30 22 - 32 mmol/L   Glucose, Bld 111 (H) 70 - 99 mg/dL   BUN 12 6 - 20 mg/dL   Creatinine, Ser 1.20 0.61 - 1.24 mg/dL   Calcium 9.7 8.9 - 10.3 mg/dL   GFR calc non Af Amer >60 >60 mL/min   GFR calc Af Amer >60 >60 mL/min   Anion gap 6 5 - 15  CBC  Result Value Ref Range   WBC 4.8 4.0 - 10.5 K/uL   RBC 5.30 4.22 -  5.81 MIL/uL   Hemoglobin 14.3 13.0 - 17.0 g/dL   HCT 44.3 39.0 - 52.0 %   MCV 83.6 80.0 - 100.0 fL   MCH 27.0 26.0 - 34.0 pg   MCHC 32.3 30.0 - 36.0 g/dL   RDW 12.6 11.5 - 15.5 %   Platelets 167 150 - 400 K/uL   nRBC 0.0 0.0 - 0.2 %  Troponin I - ONCE - STAT  Result Value Ref Range   Troponin I <0.03 <0.03 ng/mL   Lab Results  Component Value Date   CHOL 269 (H) 10/08/2017   HDL 46 10/08/2017   LDLCALC 181 (H) 10/08/2017   LDLDIRECT 117 11/08/2010   TRIG 210 (H) 10/08/2017   CHOLHDL 5.8 (H) 10/08/2017    Lab Results  Component Value Date   HGBA1C 7.1 (H) 10/08/2017       Assessment & Plan:   Problem List Items Addressed This Visit    Left upper arm pain    More burning discomfort/paresthesias than actual pain, after popping sensation last week. No reproducible tenderness or neurological or musculoskeletal deficit on exam today. Now fully resolved. Will monitor symptoms.       Elevated blood-pressure reading without diagnosis of hypertension - Primary    Isolated episode last week of hypertension associated with strange sensation of L arm. Nonfocal neurological exam today. Doubt cardiac or neurological process. ?stress related symptoms as he has felt better after discussing some stress in his life with his wife. I did recommend mediterranean diet, low salt high potassium foods, and regular exercise routine. Reviewed importance of implementing healthy stress relieving strategies. Monitor BP closely at home over next several months, update [place] if consistently >140/90. Pt agrees with plan.       Dyslipidemia    Chol levels markedly elevated when checked earlier this year - he agrees to start atorvastatin 40mg  daily.  The 10-year ASCVD risk score Mikey Bussing DC Brooke Bonito., et al., 2013) is: 13.6%   Values used to calculate the score:     Age: 79 years     Sex: Male     Is Non-Hispanic African American: Yes     Diabetic: Yes     Tobacco smoker: No     Systolic Blood Pressure: 664 mmHg      Is BP treated: No     HDL Cholesterol: 46 mg/dL     Total Cholesterol: 269 mg/dL       Relevant Medications   atorvastatin (LIPITOR) 40 MG tablet       Meds ordered this encounter  Medications  . atorvastatin (LIPITOR) 40 MG tablet    Sig: Take 1 tablet (40 mg total) by  mouth daily.    Dispense:  90 tablet    Refill:  3   No orders of the defined types were placed in this encounter.   Follow up plan: Return if symptoms worsen or fail to improve, for annual exam, prior fasting for blood work.  Ria Bush, MD

## 2018-04-27 ENCOUNTER — Ambulatory Visit (INDEPENDENT_AMBULATORY_CARE_PROVIDER_SITE_OTHER): Payer: 59 | Admitting: Family Medicine

## 2018-04-27 ENCOUNTER — Encounter: Payer: Self-pay | Admitting: Family Medicine

## 2018-04-27 VITALS — BP 134/86 | HR 70 | Temp 98.2°F | Ht 71.75 in | Wt 235.5 lb

## 2018-04-27 DIAGNOSIS — E785 Hyperlipidemia, unspecified: Secondary | ICD-10-CM | POA: Diagnosis not present

## 2018-04-27 DIAGNOSIS — I10 Essential (primary) hypertension: Secondary | ICD-10-CM | POA: Insufficient documentation

## 2018-04-27 DIAGNOSIS — R030 Elevated blood-pressure reading, without diagnosis of hypertension: Secondary | ICD-10-CM

## 2018-04-27 DIAGNOSIS — M79622 Pain in left upper arm: Secondary | ICD-10-CM | POA: Insufficient documentation

## 2018-04-27 MED ORDER — ATORVASTATIN CALCIUM 40 MG PO TABS: 40.0000 mg | ORAL_TABLET | Freq: Every day | ORAL | 3 refills | Status: DC

## 2018-04-27 NOTE — Assessment & Plan Note (Signed)
More burning discomfort/paresthesias than actual pain, after popping sensation last week. No reproducible tenderness or neurological or musculoskeletal deficit on exam today. Now fully resolved. Will monitor symptoms.

## 2018-04-27 NOTE — Patient Instructions (Addendum)
Restart lipitor.  I'm glad you're doing better. Keep an eye on blood pressure over the next several weeks - a few times a week and let me know if consistently >140/90.  Work on low salt/sodium diet - goal <1.5gm (1,500mg ) per day. Eat a diet high in fruits/vegetables and whole grains.  Look into mediterranean diet. Goal activity is 172min/wk of moderate intensity exercise.  This can be split into 30 minute chunks.  If you are not at this level, you can start with smaller 10-15 min increments and slowly build up activity. Look at [employer] for more resources       Mediterranean Diet  Why follow it? Research shows. . Those who follow the Mediterranean diet have a reduced risk of heart disease  . The diet is associated with a reduced incidence of Parkinson's and Alzheimer's diseases . People following the diet may have longer life expectancies and lower rates of chronic diseases  . The Dietary Guidelines for Americans recommends the Mediterranean diet as an eating plan to promote health and prevent disease  What Is the Mediterranean Diet?  . Healthy eating plan based on typical foods and recipes of Mediterranean-style cooking . The diet is primarily a plant based diet; these foods should make up a majority of meals   Starches - Plant based foods should make up a majority of meals - They are an important sources of vitamins, minerals, energy, antioxidants, and fiber - Choose whole grains, foods high in fiber and minimally processed items  - Typical grain sources include wheat, oats, barley, corn, brown rice, bulgar, farro, millet, polenta, couscous  - Various types of beans include chickpeas, lentils, fava beans, black beans, white beans   Fruits  Veggies - Large quantities of antioxidant rich fruits & veggies; 6 or more servings  - Vegetables can be eaten raw or lightly drizzled with oil and cooked  - Vegetables common to the traditional Mediterranean Diet include: artichokes, arugula,  beets, broccoli, brussel sprouts, cabbage, carrots, celery, collard greens, cucumbers, eggplant, kale, leeks, lemons, lettuce, mushrooms, okra, onions, peas, peppers, potatoes, pumpkin, radishes, rutabaga, shallots, spinach, sweet potatoes, turnips, zucchini - Fruits common to the Mediterranean Diet include: apples, apricots, avocados, cherries, clementines, dates, figs, grapefruits, grapes, melons, nectarines, oranges, peaches, pears, pomegranates, strawberries, tangerines  Fats - Replace butter and margarine with healthy oils, such as olive oil, canola oil, and tahini  - Limit nuts to no more than a handful a day  - Nuts include walnuts, almonds, pecans, pistachios, pine nuts  - Limit or avoid candied, honey roasted or heavily salted nuts - Olives are central to the [employer] - can be eaten whole or used in a variety of dishes   Meats Protein - Limiting red meat: no more than a few times a month - When eating red meat: choose lean cuts and keep the portion to the size of deck of cards - Eggs: approx. 0 to 4 times a week  - Fish and lean poultry: at least 2 a week  - Healthy protein sources include, chicken, [place], lean beef, lamb - Increase intake of seafood such as tuna, salmon, trout, mackerel, shrimp, scallops - Avoid or limit high fat processed meats such as sausage and bacon  Dairy - Include moderate amounts of low fat dairy products  - Focus on healthy dairy such as fat free yogurt, skim milk, low or reduced fat cheese - Limit dairy products higher in fat such as whole or 2% milk, cheese, ice cream  Alcohol - Moderate amounts of red wine is ok  - No more than 5 oz daily for women (all ages) and men older than age 94  - No more than 10 oz of wine daily for men younger than 50  Other - Limit sweets and other desserts  - Use herbs and spices instead of salt to flavor foods  - Herbs and spices common to the traditional Mediterranean Diet include: basil, bay leaves, chives, cloves,  cumin, fennel, garlic, lavender, marjoram, mint, oregano, parsley, pepper, rosemary, sage, savory, sumac, tarragon, thyme   It's not just a diet, it's a lifestyle:  . The Mediterranean diet includes lifestyle factors typical of those in the region  . Foods, drinks and meals are best eaten with others and savored . Daily physical activity is important for overall good health . This could be strenuous exercise like running and aerobics . This could also be more leisurely activities such as walking, housework, yard-work, or taking the stairs . Moderation is the key; a balanced and healthy diet accommodates most foods and drinks . Consider portion sizes and frequency of consumption of certain foods   Meal Ideas & Options:  . Breakfast:  o Whole wheat toast or whole wheat English muffins with peanut butter & hard boiled egg o Steel cut oats topped with apples & cinnamon and skim milk  o Fresh fruit: banana, strawberries, melon, berries, peaches  o Smoothies: strawberries, bananas, greek yogurt, peanut butter o Low fat greek yogurt with blueberries and granola  o Egg white omelet with spinach and mushrooms o Breakfast couscous: whole wheat couscous, apricots, skim milk, cranberries  . Sandwiches:  o Hummus and grilled vegetables (peppers, zucchini, squash) on whole wheat bread   o Grilled chicken on whole wheat pita with lettuce, tomatoes, cucumbers or tzatziki  o Tuna salad on whole wheat bread: tuna salad made with greek yogurt, olives, red peppers, capers, green onions o Garlic rosemary lamb pita: lamb sauted with garlic, rosemary, salt & pepper; add lettuce, cucumber, greek yogurt to pita - flavor with lemon juice and black pepper  . Seafood:  o Mediterranean grilled salmon, seasoned with garlic, basil, parsley, lemon juice and black pepper o Shrimp, lemon, and spinach whole-grain pasta salad made with low fat greek yogurt  o Seared scallops with lemon orzo  o Seared tuna steaks seasoned  salt, pepper, coriander topped with tomato mixture of olives, tomatoes, olive oil, minced garlic, parsley, green onions and cappers  . Meats:  o Herbed greek chicken salad with kalamata olives, cucumber, feta  o Red bell peppers stuffed with spinach, bulgur, lean ground beef (or lentils) & topped with feta   o Kebabs: skewers of chicken, tomatoes, onions, zucchini, squash  o [place] burgers: made with red onions, mint, dill, lemon juice, feta cheese topped with roasted red peppers . Vegetarian o Cucumber salad: cucumbers, artichoke hearts, celery, red onion, feta cheese, tossed in olive oil & lemon juice  o Hummus and whole grain pita points with a greek salad (lettuce, tomato, feta, olives, cucumbers, red onion) o Lentil soup with celery, carrots made with vegetable broth, garlic, salt and pepper  o Tabouli salad: parsley, bulgur, mint, scallions, cucumbers, tomato, radishes, lemon juice, olive oil, salt and pepper.

## 2018-04-27 NOTE — Assessment & Plan Note (Signed)
Chol levels markedly elevated when checked earlier this year - he agrees to start atorvastatin 40mg  daily.  The 10-year ASCVD risk score Mikey Bussing DC Brooke Bonito., et al., 2013) is: 13.6%   Values used to calculate the score:     Age: 52 years     Sex: Male     Is Non-Hispanic African American: Yes     Diabetic: Yes     Tobacco smoker: No     Systolic Blood Pressure: 034 mmHg     Is BP treated: No     HDL Cholesterol: 46 mg/dL     Total Cholesterol: 269 mg/dL

## 2018-04-27 NOTE — Assessment & Plan Note (Signed)
Isolated episode last week of hypertension associated with strange sensation of L arm. Nonfocal neurological exam today. Doubt cardiac or neurological process. ?stress related symptoms as he has felt better after discussing some stress in his life with his wife. I did recommend mediterranean diet, low salt high potassium foods, and regular exercise routine. Reviewed importance of implementing healthy stress relieving strategies. Monitor BP closely at home over next several months, update [place] if consistently >140/90. Pt agrees with plan.

## 2018-06-11 ENCOUNTER — Encounter: Payer: Self-pay | Admitting: Family Medicine

## 2018-06-11 ENCOUNTER — Ambulatory Visit (INDEPENDENT_AMBULATORY_CARE_PROVIDER_SITE_OTHER): Payer: No Typology Code available for payment source | Admitting: Family Medicine

## 2018-06-11 ENCOUNTER — Ambulatory Visit: Payer: Self-pay | Admitting: Family Medicine

## 2018-06-11 VITALS — BP 114/78 | HR 57 | Temp 98.6°F | Ht 71.75 in | Wt 230.0 lb

## 2018-06-11 DIAGNOSIS — E785 Hyperlipidemia, unspecified: Secondary | ICD-10-CM

## 2018-06-11 DIAGNOSIS — J019 Acute sinusitis, unspecified: Secondary | ICD-10-CM | POA: Diagnosis not present

## 2018-06-11 DIAGNOSIS — E119 Type 2 diabetes mellitus without complications: Secondary | ICD-10-CM

## 2018-06-11 LAB — POCT GLYCOSYLATED HEMOGLOBIN (HGB A1C): HEMOGLOBIN A1C: 6.7 % — AB (ref 4.0–5.6)

## 2018-06-11 MED ORDER — ATORVASTATIN CALCIUM 40 MG PO TABS: 40.0000 mg | ORAL_TABLET | Freq: Every day | ORAL | 3 refills | Status: DC

## 2018-06-11 MED ORDER — AMOXICILLIN-POT CLAVULANATE 875-125 MG PO TABS: 1.0000 | ORAL_TABLET | Freq: Two times a day (BID) | ORAL | 0 refills | Status: AC

## 2018-06-11 NOTE — Assessment & Plan Note (Signed)
Update A1c today 

## 2018-06-11 NOTE — Patient Instructions (Addendum)
You have a sinus infection ?bacterial vs viral. Take medicine as prescribed: augmentin 10 days Push fluids and plenty of rest. Nasal saline irrigation or neti pot to help drain sinuses. May use plain mucinex with plenty of fluid to help mobilize mucous. Take ibuprofen 400-600mg  with meals for next few days. Please let us know if fever >101.5, trouble opening/closing mouth, difficulty swallowing, or worsening instead of improving as expected.  A1c today to check sugar control.

## 2018-06-11 NOTE — Progress Notes (Signed)
BP 114/78 (BP Location: Left Arm, Patient Position: Sitting, Cuff Size: Normal)   Pulse (!) 57   Temp 98.6 F (37 C) (Oral)   Ht 5' 11.75" (1.822 m)   Wt 230 lb (104.3 kg)   SpO2 98%   BMI 31.41 kg/m    CC: ?sinus congestion Subjective:    Patient ID: Gerald Collins, male    DOB: 1965-11-19, 53 y.o.   MRN: [id number]  HPI: Gerald Collins is a 53 y.o. male presenting on 06/11/2018 for Nasal Congestion   1+ wk h/o URI sxs progressed to sinus congestion, facial pressure, sinus pressure headache, productive cough of colored mucous. Chills and tooth discomfort. Sleeping ok. Head > chest congestion. Feels well during th day, but worse at night time or when he first wakes up.   Has tried ginger and natural remedies.  Wife sick at home.  Non smoker.  Known diabetes - diet controlled.  H/o PNA. No h/o asthma.  No fevers, ear pain, dyspnea or wheezing.   BIL Gerald Collins     Relevant past medical, surgical, family and social history reviewed and updated as indicated. Interim medical history since our last visit reviewed. Allergies and medications reviewed and updated. Outpatient Medications Prior to Visit  Medication Sig Dispense Refill  . atorvastatin (LIPITOR) 40 MG tablet Take 1 tablet (40 mg total) by mouth daily. (Patient not taking: Reported on 06/11/2018) 90 tablet 3   No facility-administered medications prior to visit.      Per HPI unless specifically indicated in ROS section below Review of Systems Objective:    BP 114/78 (BP Location: Left Arm, Patient Position: Sitting, Cuff Size: Normal)   Pulse (!) 57   Temp 98.6 F (37 C) (Oral)   Ht 5' 11.75" (1.822 m)   Wt 230 lb (104.3 kg)   SpO2 98%   BMI 31.41 kg/m   Wt Readings from Last 3 Encounters:  06/11/18 230 lb (104.3 kg)  04/27/18 235 lb 8 oz (106.8 kg)  10/08/17 238 lb 12 oz (108.3 kg)    Physical Exam Vitals signs and nursing note reviewed.  Constitutional:      General: He is not in acute  distress.    Appearance: He is well-developed.  HENT:     Head: Normocephalic and atraumatic.     Right Ear: Hearing, tympanic membrane, ear canal and external ear normal.     Left Ear: Hearing, tympanic membrane, ear canal and external ear normal.     Nose: Mucosal edema (nasal mucosal congestion) and rhinorrhea present.     Right Sinus: Maxillary sinus tenderness present. No frontal sinus tenderness.     Left Sinus: Maxillary sinus tenderness present. No frontal sinus tenderness.     Mouth/Throat:     Pharynx: Uvula midline. No oropharyngeal exudate or posterior oropharyngeal erythema.     Tonsils: No tonsillar abscesses.  Eyes:     General: No scleral icterus.    Conjunctiva/sclera: Conjunctivae normal.     Pupils: Pupils are equal, round, and reactive to light.  Neck:     Musculoskeletal: Normal range of motion and neck supple.  Cardiovascular:     Rate and Rhythm: Normal rate and regular rhythm.     Heart sounds: Normal heart sounds. No murmur.  Pulmonary:     Effort: Pulmonary effort is normal. No respiratory distress.     Breath sounds: Normal breath sounds. No wheezing or rales.  Lymphadenopathy:     Cervical: No cervical adenopathy.  Skin:    General: Skin is warm and dry.     Findings: No rash.       Lab Results  Component Value Date   CHOL 269 (H) 10/08/2017   HDL 46 10/08/2017   LDLCALC 181 (H) 10/08/2017   LDLDIRECT 117 11/08/2010   TRIG 210 (H) 10/08/2017   CHOLHDL 5.8 (H) 10/08/2017    Lab Results  Component Value Date   HGBA1C 6.7 (A) 06/11/2018    Assessment & Plan:   Problem List Items Addressed This Visit    Dyslipidemia    Compliant with atorvastatin - requests refill today.       Relevant Medications   atorvastatin (LIPITOR) 40 MG tablet   Diet-controlled diabetes mellitus ([place])    Update A1c today.      Relevant Medications   atorvastatin (LIPITOR) 40 MG tablet   Other Relevant Orders   POCT glycosylated hemoglobin (Hb A1C)  (Completed)   Acute sinusitis - Primary    Discussed viral vs bacterial cause. Ongoing over 8 days now, possibly bacterial. Rx augmentin course, advised try supportive care as per instructions for next 1-2 days to see if will improve without abx, but will fill if not improving. Pt agrees with plan.       Relevant Medications   amoxicillin-clavulanate (AUGMENTIN) 875-125 MG tablet       Meds ordered this encounter  Medications  . atorvastatin (LIPITOR) 40 MG tablet    Sig: Take 1 tablet (40 mg total) by mouth daily.    Dispense:  90 tablet    Refill:  3  . amoxicillin-clavulanate (AUGMENTIN) 875-125 MG tablet    Sig: Take 1 tablet by mouth 2 (two) times daily for 10 days.    Dispense:  20 tablet    Refill:  0   Orders Placed This Encounter  Procedures  . POCT glycosylated hemoglobin (Hb A1C)   Patient Instructions  You have a sinus infection ?bacterial vs viral. Take medicine as prescribed: augmentin 10 days Push fluids and plenty of rest. Nasal saline irrigation or neti pot to help drain sinuses. May use plain mucinex with plenty of fluid to help mobilize mucous. Take ibuprofen 400-600mg  with meals for next few days. Please let us know if fever >101.5, trouble opening/closing mouth, difficulty swallowing, or worsening instead of improving as expected.  A1c today to check sugar control.   Follow up plan: No follow-ups on file.  Ria Bush, MD

## 2018-06-11 NOTE — Assessment & Plan Note (Signed)
Compliant with atorvastatin - requests refill today.

## 2018-06-11 NOTE — Assessment & Plan Note (Signed)
Discussed viral vs bacterial cause. Ongoing over 8 days now, possibly bacterial. Rx augmentin course, advised try supportive care as per instructions for next 1-2 days to see if will improve without abx, but will fill if not improving. Pt agrees with plan.

## 2018-11-24 ENCOUNTER — Telehealth: Payer: Self-pay | Admitting: Family Medicine

## 2018-11-24 NOTE — Telephone Encounter (Signed)
Patient called to schedule his annual CPE.  Patient stated he needed this done before July 1st for insurance purposes.  Is there anywhere we could put the patient. He went ahead and scheduled for appt in oct But stated that he would need some type of letter sent to him stating this is what was available.

## 2018-11-25 NOTE — Telephone Encounter (Signed)
May offer CPE on this Friday at 11:30am or any 30 min slot next week. Thanks.

## 2018-11-25 NOTE — Telephone Encounter (Signed)
Called patient to schedule sooner appt.  Left message to c/b

## 2018-11-27 ENCOUNTER — Telehealth: Payer: Self-pay | Admitting: Family Medicine

## 2018-11-27 NOTE — Telephone Encounter (Signed)
Left message asking pt to call office Pt has appointment on 6/22with dr g  See if pt would like to do a virtual appointment IF  PLEASE MARK IN APPOINTMENT NOTES  Doxy me  TEXT if he wants appointment through smart phone  And ADD phone number  EMAIL if pt wants appointment with either computer lap top or ipad  Put email address in appointment notes

## 2018-11-30 ENCOUNTER — Ambulatory Visit (INDEPENDENT_AMBULATORY_CARE_PROVIDER_SITE_OTHER): Payer: No Typology Code available for payment source | Admitting: Family Medicine

## 2018-11-30 ENCOUNTER — Encounter: Payer: Self-pay | Admitting: Family Medicine

## 2018-11-30 ENCOUNTER — Other Ambulatory Visit: Payer: Self-pay

## 2018-11-30 VITALS — BP 150/96 | HR 74 | Temp 97.3°F | Ht 72.0 in | Wt 233.4 lb

## 2018-11-30 DIAGNOSIS — Z0000 Encounter for general adult medical examination without abnormal findings: Secondary | ICD-10-CM

## 2018-11-30 DIAGNOSIS — I10 Essential (primary) hypertension: Secondary | ICD-10-CM

## 2018-11-30 DIAGNOSIS — B353 Tinea pedis: Secondary | ICD-10-CM

## 2018-11-30 DIAGNOSIS — E669 Obesity, unspecified: Secondary | ICD-10-CM

## 2018-11-30 DIAGNOSIS — E119 Type 2 diabetes mellitus without complications: Secondary | ICD-10-CM | POA: Diagnosis not present

## 2018-11-30 DIAGNOSIS — E785 Hyperlipidemia, unspecified: Secondary | ICD-10-CM | POA: Diagnosis not present

## 2018-11-30 DIAGNOSIS — Z125 Encounter for screening for malignant neoplasm of prostate: Secondary | ICD-10-CM

## 2018-11-30 DIAGNOSIS — E291 Testicular hypofunction: Secondary | ICD-10-CM

## 2018-11-30 NOTE — Assessment & Plan Note (Addendum)
Update labs. Encouraged diabetic diet.  Foot exam today.

## 2018-11-30 NOTE — Assessment & Plan Note (Signed)
Discussed lotrimin use.  

## 2018-11-30 NOTE — Assessment & Plan Note (Signed)
Again elevated this year. Will reassess at 43mo f/u visit. Encouraged limited salt in diet over the next 4 wks.

## 2018-11-30 NOTE — Patient Instructions (Addendum)
Labs today. Blood pressures staying elevated - return in 1 month after working on healthy diet and lifestyle. If staying elevated, we may start blood pressure medicine.  Return as needed or in 6 months for follow up visit.   Health Maintenance, Male A healthy lifestyle and preventive care is important for your health and wellness. Ask your health care provider about what schedule of regular examinations is right for you. What should I know about weight and diet? Eat a Healthy Diet  Eat plenty of vegetables, fruits, whole grains, low-fat dairy products, and lean protein.  Do not eat a lot of foods high in solid fats, added sugars, or salt.  Maintain a Healthy Weight Regular exercise can help you achieve or maintain a healthy weight. You should:  Do at least 150 minutes of exercise each week. The exercise should increase your heart rate and make you sweat (moderate-intensity exercise).  Do strength-training exercises at least twice a week. Watch Your Levels of Cholesterol and Blood Lipids  Have your blood tested for lipids and cholesterol every 5 years starting at 54 years of age. If you are at high risk for heart disease, you should start having your blood tested when you are 54 years old. You may need to have your cholesterol levels checked more often if: ? Your lipid or cholesterol levels are high. ? You are older than 54 years of age. ? You are at high risk for heart disease. What should I know about cancer screening? Many types of cancers can be detected early and may often be prevented. Lung Cancer  You should be screened every year for lung cancer if: ? You are a current smoker who has smoked for at least 30 years. ? You are a former smoker who has quit within the past 15 years.  Talk to your health care provider about your screening options, when you should start screening, and how often you should be screened. Colorectal Cancer  Routine colorectal cancer screening usually  begins at 54 years of age and should be repeated every 5-10 years until you are 54 years old. You may need to be screened more often if early forms of precancerous polyps or small growths are found. Your health care provider may recommend screening at an earlier age if you have risk factors for colon cancer.  Your health care provider may recommend using home test kits to check for hidden blood in the stool.  A small camera at the end of a tube can be used to examine your colon (sigmoidoscopy or colonoscopy). This checks for the earliest forms of colorectal cancer. Prostate and Testicular Cancer  Depending on your age and overall health, your health care provider may do certain tests to screen for prostate and testicular cancer.  Talk to your health care provider about any symptoms or concerns you have about testicular or prostate cancer. Skin Cancer  Check your skin from head to toe regularly.  Tell your health care provider about any new moles or changes in moles, especially if: ? There is a change in a mole's size, shape, or color. ? You have a mole that is larger than a pencil eraser.  Always use sunscreen. Apply sunscreen liberally and repeat throughout the day.  Protect yourself by wearing long sleeves, pants, a wide-brimmed hat, and sunglasses when outside. What should I know about heart disease, diabetes, and high blood pressure?  If you are 28-45 years of age, have your blood pressure checked every 3-5 years.  If you are 35 years of age or older, have your blood pressure checked every year. You should have your blood pressure measured twice-once when you are at a hospital or clinic, and once when you are not at a hospital or clinic. Record the average of the two measurements. To check your blood pressure when you are not at a hospital or clinic, you can use: ? An automated blood pressure machine at a pharmacy. ? A home blood pressure monitor.  Talk to your health care provider  about your target blood pressure.  If you are between 71-40 years old, ask your health care provider if you should take aspirin to prevent heart disease.  Have regular diabetes screenings by checking your fasting blood sugar level. ? If you are at a normal weight and have a low risk for diabetes, have this test once every three years after the age of 49. ? If you are overweight and have a high risk for diabetes, consider being tested at a younger age or more often.  A one-time screening for abdominal aortic aneurysm (AAA) by ultrasound is recommended for men aged 59-75 years who are current or former smokers. What should I know about preventing infection? Hepatitis B If you have a higher risk for hepatitis B, you should be screened for this virus. Talk with your health care provider to find out if you are at risk for hepatitis B infection. Hepatitis C Blood testing is recommended for:  Everyone born from [birth year] through 1966.  Anyone with known risk factors for hepatitis C. Sexually Transmitted Diseases (STDs)  You should be screened each year for STDs including gonorrhea and chlamydia if: ? You are sexually active and are younger than 54 years of age. ? You are older than 54 years of age and your health care provider tells you that you are at risk for this type of infection. ? Your sexual activity has changed since you were last screened and you are at an increased risk for chlamydia or gonorrhea. Ask your health care provider if you are at risk.  Talk with your health care provider about whether you are at high risk of being infected with HIV. Your health care provider may recommend a prescription medicine to help prevent HIV infection. What else can I do?  Schedule regular health, dental, and eye exams.  Stay current with your vaccines (immunizations).  Do not use any tobacco products, such as cigarettes, chewing tobacco, and e-cigarettes. If you need help quitting, ask your health  care provider.  Limit alcohol intake to no more than 2 drinks per day. One drink equals 12 ounces of beer, 5 ounces of wine, or 1 ounces of hard liquor.  Do not use street drugs.  Do not share needles.  Ask your health care provider for help if you need support or information about quitting drugs.  Tell your health care provider if you often feel depressed.  Tell your health care provider if you have ever been abused or do not feel safe at home. This information is not intended to replace advice given to you by your health care provider. Make sure you discuss any questions you have with your health care provider. Document Released: 11/23/2007 Document Revised: 01/24/2016 Document Reviewed: 02/28/2015 Elsevier Interactive Patient Education  2019 [employer].

## 2018-11-30 NOTE — Addendum Note (Signed)
Addended by: Ellamae Sia on: 11/30/2018 10:11 AM   Modules accepted: Orders

## 2018-11-30 NOTE — Assessment & Plan Note (Signed)
Trouble remembering to take statin. Encouraged adherence to daily dosing. The 10-year ASCVD risk score Mikey Bussing DC Brooke Bonito., et al., 2013) is: 15.7%   Values used to calculate the score:     Age: 52 years     Sex: Male     Is Non-Hispanic African American: Yes     Diabetic: Yes     Tobacco smoker: No     Systolic Blood Pressure: 233 mmHg     Is BP treated: No     HDL Cholesterol: 46 mg/dL     Total Cholesterol: 269 mg/dL

## 2018-11-30 NOTE — Progress Notes (Addendum)
This visit was conducted in person.  BP (!) 150/96   Pulse 74   Temp (!) 97.3 F (36.3 C) (Temporal)   Ht 6' (1.829 m)   Wt 233 lb 7 oz (105.9 kg)   SpO2 98%   BMI 31.66 kg/m    CC: CPE Subjective:    Patient ID: Gerald Collins, male    DOB: [birth year], 53 y.o.   MRN: [id number]  HPI: Gerald Collins is a 53 y.o. male presenting on 11/30/2018 for Annual Exam   HLD - trouble remembering to take statin. Requests testosterone checked due to low energy and low libido. H/o low testosterone previously on patches.  Diabetic Foot Exam - Simple   Simple Foot Form Diabetic Foot exam was performed with the following findings: Yes 11/30/2018  9:54 AM  Visual Inspection See comments: Yes Sensation Testing Intact to touch and monofilament testing bilaterally: Yes Pulse Check Posterior Tibialis and Dorsalis pulse intact bilaterally: Yes Comments Maceration between 3rd/4th and 4th/5th toes on right      Preventative: COLONOSCOPY 09/2015 hyperplastic polpy rpt 10 yrs Henrene Pastor) Prostate cancer - no fmhx.asxs. Requests screening.  Flu - doesn't receive  Td 2012, Tdap 2018 Pneumovax - declines Seat belt use discussed  Sunscreen use discussed.No changingmoleson skin  Ex smoker Alcohol - 1 beer/day Sees dentist Q23mo  Eye exam - spring 2019  Caffeine: none Lives with wife Investment banker, corporate), 2 kids, no pets Quarry manager at [employer] Activity: walking daily at least 2 miles Diet: good water, fruits/vegetables daily     Relevant past medical, surgical, family and social history reviewed and updated as indicated. Interim medical history since our last visit reviewed. Allergies and medications reviewed and updated. Outpatient Medications Prior to Visit  Medication Sig Dispense Refill  . atorvastatin (LIPITOR) 40 MG tablet Take 1 tablet (40 mg total) by mouth daily. 90 tablet 3   No facility-administered medications prior to visit.      Per HPI unless specifically indicated in ROS section below  Review of Systems  Constitutional: Negative for activity change, appetite change, chills, fatigue, fever and unexpected weight change.  HENT: Negative for hearing loss.   Eyes: Negative for visual disturbance.  Respiratory: Negative for cough, chest tightness, shortness of breath and wheezing.   Cardiovascular: Negative for chest pain, palpitations and leg swelling.  Gastrointestinal: Negative for abdominal distention, abdominal pain, blood in stool, constipation, diarrhea, nausea and vomiting.  Genitourinary: Negative for difficulty urinating and hematuria.  Musculoskeletal: Negative for arthralgias, myalgias and neck pain.  Skin: Negative for rash.  Neurological: Negative for dizziness, seizures, syncope and headaches.  Hematological: Negative for adenopathy. Does not bruise/bleed easily.  Psychiatric/Behavioral: Negative for dysphoric mood. The patient is not nervous/anxious.    Objective:    BP (!) 150/96   Pulse 74   Temp (!) 97.3 F (36.3 C) (Temporal)   Ht 6' (1.829 m)   Wt 233 lb 7 oz (105.9 kg)   SpO2 98%   BMI 31.66 kg/m   Wt Readings from Last 3 Encounters:  11/30/18 233 lb 7 oz (105.9 kg)  06/11/18 230 lb (104.3 kg)  04/27/18 235 lb 8 oz (106.8 kg)    Physical Exam Vitals signs and nursing note reviewed.  Constitutional:      General: He is not in acute distress.    Appearance: Normal appearance. He is well-developed. He is not ill-appearing.  HENT:     Head: Normocephalic and atraumatic.     Right Ear: Hearing, tympanic membrane,  ear canal and external ear normal.     Left Ear: Hearing, tympanic membrane, ear canal and external ear normal.     Nose: Nose normal.     Mouth/Throat:     Mouth: Mucous membranes are moist.     Pharynx: Uvula midline. No oropharyngeal exudate or posterior oropharyngeal erythema.  Eyes:     General: No scleral icterus.    Conjunctiva/sclera: Conjunctivae normal.     Pupils: Pupils are equal, round, and reactive to light.  Neck:      Musculoskeletal: Normal range of motion and neck supple.  Cardiovascular:     Rate and Rhythm: Normal rate and regular rhythm.     Pulses: Normal pulses.          Radial pulses are 2+ on the right side and 2+ on the left side.     Heart sounds: Normal heart sounds. No murmur.  Pulmonary:     Effort: Pulmonary effort is normal. No respiratory distress.     Breath sounds: Normal breath sounds. No wheezing, rhonchi or rales.  Abdominal:     General: Abdomen is flat. Bowel sounds are normal. There is no distension.     Palpations: Abdomen is soft. There is no mass.     Tenderness: There is no abdominal tenderness. There is no guarding or rebound.     Hernia: No hernia is present.  Genitourinary:    Prostate: Normal. Not enlarged (20gm), not tender and no nodules present.     Rectum: Normal. No mass, tenderness, anal fissure, external hemorrhoid or internal hemorrhoid. Normal anal tone.  Musculoskeletal: Normal range of motion.     Right lower leg: No edema.     Left lower leg: No edema.  Lymphadenopathy:     Cervical: No cervical adenopathy.  Skin:    General: Skin is warm and dry.     Capillary Refill: Capillary refill takes less than 2 seconds.     Findings: No rash.  Neurological:     General: No focal deficit present.     Mental Status: He is alert and oriented to person, place, and time.     Comments: CN grossly intact, station and gait intact  Psychiatric:        Mood and Affect: Mood normal.        Behavior: Behavior normal.        Thought Content: Thought content normal.        Judgment: Judgment normal.       Results for orders placed or performed in visit on 06/11/18  POCT glycosylated hemoglobin (Hb A1C)  Result Value Ref Range   Hemoglobin A1C 6.7 (A) 4.0 - 5.6 %   HbA1c POC (<> result, manual entry)     HbA1c, POC (prediabetic range)     HbA1c, POC (controlled diabetic range)     Lab Results  Component Value Date   TSH 1.43 07/17/2015    Assessment &  Plan:   Problem List Items Addressed This Visit    Tinea pedis of right foot    Discussed lotrimin use.       Obesity, Class I, BMI 30-34.9   Hypogonadism in male    Update Testosterone level.  Discussed testosterone replacement, would likely start with topical cream.       Relevant Orders   Testosterone   Comprehensive metabolic panel   TSH   CBC with Differential/Platelet   Health care maintenance - Primary    Preventative protocols reviewed and updated  unless pt declined. Discussed healthy diet and lifestyle.       Essential hypertension    Again elevated this year. Will reassess at 71mo f/u visit. Encouraged limited salt in diet over the next 4 wks.       Dyslipidemia    Trouble remembering to take statin. Encouraged adherence to daily dosing. The 10-year ASCVD risk score Mikey Bussing DC Brooke Bonito., et al., 2013) is: 15.7%   Values used to calculate the score:     Age: 15 years     Sex: Male     Is Non-Hispanic African American: Yes     Diabetic: Yes     Tobacco smoker: No     Systolic Blood Pressure: 022 mmHg     Is BP treated: No     HDL Cholesterol: 46 mg/dL     Total Cholesterol: 269 mg/dL       Relevant Orders   Lipid panel   Comprehensive metabolic panel   TSH   Diet-controlled diabetes mellitus ([place])    Update labs. Encouraged diabetic diet.  Foot exam today.       Relevant Orders   Hemoglobin A1c   Microalbumin / creatinine urine ratio    Other Visit Diagnoses    Special screening for malignant neoplasm of prostate       Relevant Orders   PSA       No orders of the defined types were placed in this encounter.  Orders Placed This Encounter  Procedures  . Testosterone  . Lipid panel  . Comprehensive metabolic panel  . TSH  . Hemoglobin A1c  . PSA  . CBC with Differential/Platelet  . Microalbumin / creatinine urine ratio    Follow up plan: Return in about 6 months (around 06/01/2019) for follow up visit.  Ria Bush, MD

## 2018-11-30 NOTE — Assessment & Plan Note (Signed)
Update Testosterone level.  Discussed testosterone replacement, would likely start with topical cream.

## 2018-11-30 NOTE — Assessment & Plan Note (Signed)
Preventative protocols reviewed and updated unless pt declined. Discussed healthy diet and lifestyle.  

## 2018-12-01 ENCOUNTER — Other Ambulatory Visit: Payer: Self-pay | Admitting: Family Medicine

## 2018-12-01 LAB — CBC WITH DIFFERENTIAL/PLATELET
Basophils Absolute: 0 10*3/uL (ref 0.0–0.2)
Basos: 1 %
EOS (ABSOLUTE): 0 10*3/uL (ref 0.0–0.4)
Eos: 1 %
Hematocrit: 41.1 % (ref 37.5–51.0)
Hemoglobin: 13.8 g/dL (ref 13.0–17.7)
Immature Grans (Abs): 0 10*3/uL (ref 0.0–0.1)
Immature Granulocytes: 0 %
Lymphocytes Absolute: 1.2 10*3/uL (ref 0.7–3.1)
Lymphs: 41 %
MCH: 27.1 pg (ref 26.6–33.0)
MCHC: 33.6 g/dL (ref 31.5–35.7)
MCV: 81 fL (ref 79–97)
Monocytes Absolute: 0.3 10*3/uL (ref 0.1–0.9)
Monocytes: 10 %
Neutrophils Absolute: 1.4 10*3/uL (ref 1.4–7.0)
Neutrophils: 47 %
Platelets: 176 10*3/uL (ref 150–450)
RBC: 5.1 x10E6/uL (ref 4.14–5.80)
RDW: 13.1 % (ref 11.6–15.4)
WBC: 2.9 10*3/uL — ABNORMAL LOW (ref 3.4–10.8)

## 2018-12-01 LAB — COMPREHENSIVE METABOLIC PANEL WITH GFR
ALT: 39 IU/L (ref 0–44)
AST: 23 IU/L (ref 0–40)
Albumin/Globulin Ratio: 2.2 (ref 1.2–2.2)
Albumin: 4.9 g/dL (ref 3.8–4.9)
Alkaline Phosphatase: 77 IU/L (ref 39–117)
BUN/Creatinine Ratio: 10 (ref 9–20)
BUN: 13 mg/dL (ref 6–24)
Bilirubin Total: 0.5 mg/dL (ref 0.0–1.2)
CO2: 24 mmol/L (ref 20–29)
Calcium: 9.4 mg/dL (ref 8.7–10.2)
Chloride: 101 mmol/L (ref 96–106)
Creatinine, Ser: 1.26 mg/dL (ref 0.76–1.27)
GFR calc Af Amer: 75 mL/min/1.73 (ref >59)
GFR calc non Af Amer: 65 mL/min/1.73 (ref >59)
Globulin, Total: 2.2 g/dL (ref 1.5–4.5)
Glucose: 222 mg/dL — ABNORMAL HIGH (ref 65–99)
Potassium: 4.5 mmol/L (ref 3.5–5.2)
Sodium: 138 mmol/L (ref 134–144)
Total Protein: 7.1 g/dL (ref 6.0–8.5)

## 2018-12-01 LAB — LIPID PANEL
Chol/HDL Ratio: 5.9 ratio — ABNORMAL HIGH (ref 0.0–5.0)
Cholesterol, Total: 279 mg/dL — ABNORMAL HIGH (ref 100–199)
HDL: 47 mg/dL (ref >39)
LDL Calculated: 181 mg/dL — ABNORMAL HIGH (ref 0–99)
Triglycerides: 253 mg/dL — ABNORMAL HIGH (ref 0–149)
VLDL Cholesterol Cal: 51 mg/dL — ABNORMAL HIGH (ref 5–40)

## 2018-12-01 LAB — MICROALBUMIN / CREATININE URINE RATIO
Creatinine, Urine: 242.5 mg/dL
Microalb/Creat Ratio: 2 mg/g creat (ref 0–29)
Microalbumin, Urine: 6 ug/mL

## 2018-12-01 LAB — TESTOSTERONE: Testosterone: 211 ng/dL — ABNORMAL LOW (ref 264–916)

## 2018-12-01 LAB — PSA: Prostate Specific Ag, Serum: 0.2 ng/mL (ref 0.0–4.0)

## 2018-12-01 LAB — TSH: TSH: 1.28 u[IU]/mL (ref 0.450–4.500)

## 2018-12-01 LAB — HEMOGLOBIN A1C
Est. average glucose Bld gHb Est-mCnc: 203 mg/dL
Hgb A1c MFr Bld: 8.7 % — ABNORMAL HIGH (ref 4.8–5.6)

## 2018-12-01 MED ORDER — SITAGLIPTIN PHOSPHATE 50 MG PO TABS: 50.0000 mg | ORAL_TABLET | Freq: Every day | ORAL | 6 refills | Status: DC

## 2019-01-27 ENCOUNTER — Encounter: Payer: Self-pay | Admitting: Emergency Medicine

## 2019-01-27 ENCOUNTER — Emergency Department: Payer: No Typology Code available for payment source

## 2019-01-27 ENCOUNTER — Other Ambulatory Visit: Payer: Self-pay

## 2019-01-27 ENCOUNTER — Emergency Department
Admission: EM | Admit: 2019-01-27 | Discharge: 2019-01-27 | Disposition: A | Discharge status: Home / Self Care | Payer: No Typology Code available for payment source | Attending: Emergency Medicine | Admitting: Emergency Medicine

## 2019-01-27 DIAGNOSIS — Y9301 Activity, walking, marching and hiking: Secondary | ICD-10-CM | POA: Diagnosis not present

## 2019-01-27 DIAGNOSIS — Y998 Other external cause status: Secondary | ICD-10-CM | POA: Insufficient documentation

## 2019-01-27 DIAGNOSIS — Z87891 Personal history of nicotine dependence: Secondary | ICD-10-CM | POA: Insufficient documentation

## 2019-01-27 DIAGNOSIS — Y929 Unspecified place or not applicable: Secondary | ICD-10-CM | POA: Insufficient documentation

## 2019-01-27 DIAGNOSIS — W010XXA Fall on same level from slipping, tripping and stumbling without subsequent striking against object, initial encounter: Secondary | ICD-10-CM | POA: Insufficient documentation

## 2019-01-27 DIAGNOSIS — I10 Essential (primary) hypertension: Secondary | ICD-10-CM | POA: Diagnosis not present

## 2019-01-27 DIAGNOSIS — Z91040 Latex allergy status: Secondary | ICD-10-CM | POA: Diagnosis not present

## 2019-01-27 DIAGNOSIS — S43014A Anterior dislocation of right humerus, initial encounter: Secondary | ICD-10-CM | POA: Diagnosis not present

## 2019-01-27 DIAGNOSIS — Z79899 Other long term (current) drug therapy: Secondary | ICD-10-CM | POA: Diagnosis not present

## 2019-01-27 DIAGNOSIS — E119 Type 2 diabetes mellitus without complications: Secondary | ICD-10-CM | POA: Diagnosis not present

## 2019-01-27 DIAGNOSIS — S4991XA Unspecified injury of right shoulder and upper arm, initial encounter: Secondary | ICD-10-CM | POA: Diagnosis present

## 2019-01-27 MED ORDER — FENTANYL CITRATE (PF) 100 MCG/2ML IJ SOLN: 50.0000 ug | Freq: Once | INTRAMUSCULAR | Status: DC

## 2019-01-27 MED ORDER — PROPOFOL 10 MG/ML IV BOLUS
50.0000 mg | Freq: Once | INTRAVENOUS | Status: AC
  Administered 2019-01-27: 50 mg via INTRAVENOUS
  Filled 2019-01-27: qty 20

## 2019-01-27 MED ORDER — KETAMINE HCL 10 MG/ML IJ SOLN
50.0000 mg | Freq: Once | INTRAMUSCULAR | Status: AC
  Administered 2019-01-27: 50 mg via INTRAVENOUS
  Filled 2019-01-27: qty 1

## 2019-01-27 MED ORDER — FENTANYL CITRATE (PF) 100 MCG/2ML IJ SOLN
100.0000 ug | Freq: Once | INTRAMUSCULAR | Status: AC
  Administered 2019-01-27: 100 ug via INTRAVENOUS
  Filled 2019-01-27: qty 2

## 2019-01-27 NOTE — ED Notes (Signed)
Attempted IV access x 2 but was unsuccessful

## 2019-01-27 NOTE — ED Provider Notes (Signed)
[facility] Emergency Department Provider Note  ____________________________________________   First MD Initiated Contact with Patient 01/27/19 1612     (approximate)  I have reviewed the triage vital signs and the nursing notes.   HISTORY  Chief Complaint Shoulder Pain    HPI Gerald Collins is a 53 y.o. male with diabetes, hypertension, hyperlipidemia who presents with a mechanical fall onto his right shoulder.  Patient endorses that the surface he was walking on was slippery and he fell and landed onto his right shoulder.  He felt a pop.  He has having severe pain that is constant, worse with trying to move the arm, nothing makes it better.  He did not hit his head did not lose consciousness.  No chest pain or shortness of breath.  No other pain.          Past Medical History:  Diagnosis Date  . Diabetes type 2, controlled ([place]) 2012   controlled with lifestyle (diet, exercise) change  . H/O: pneumonia 06/11/1987  . HLD (hyperlipidemia)   . HTN (hypertension)   . Other specified visual disturbances     Patient Active Problem List   Diagnosis Date Noted  . Essential hypertension 04/27/2018  . External hemorrhoid 10/08/2017  . Hypogonadism in male 08/02/2015  . Obesity, Class I, BMI 30-34.9 07/24/2015  . Abdominal pain, epigastric 06/16/2015  . Health care maintenance 10/08/2013  . Tinea pedis of right foot 11/06/2010  . Dyslipidemia 08/07/2010  . Diet-controlled diabetes mellitus ([place]) 07/24/2010    Past Surgical History:  Procedure Laterality Date  . COLONOSCOPY  09/2015   hyperplastic polpy rpt 10 yrs Henrene Pastor)  . VASECTOMY  1998    Prior to Admission medications   Medication Sig Start Date End Date Taking? Authorizing Provider  atorvastatin (LIPITOR) 40 MG tablet Take 1 tablet (40 mg total) by mouth daily. 06/11/18   Ria Bush, MD  sitaGLIPtin (JANUVIA) 50 MG tablet Take 1 tablet (50 mg total) by mouth daily. 12/01/18    Ria Bush, MD    Allergies Latex and Metformin  Family History  Problem Relation Age of Onset  . Healthy Father   . Hypertension Mother   . Cancer Mother        ovarian  . Colon polyps Mother   . Hyperlipidemia Mother   . Coronary artery disease Maternal Grandfather 75       MI  . Other Brother        Prediabetes  . Cancer Maternal Uncle        unknown form with mets  . Colon polyps Maternal Aunt     Social History Social History   Tobacco Use  . Smoking status: Former Smoker    Types: Cigars  . Smokeless tobacco: Never Used  . Tobacco comment: on occasion only-never regularly  Substance Use Topics  . Alcohol use: Yes    Alcohol/week: 0.0 standard drinks    Comment: Rare  . Drug use: No      Review of Systems Constitutional: No fever/chills Eyes: No visual changes. ENT: No sore throat. Cardiovascular: Denies chest pain. Respiratory: Denies shortness of breath. Gastrointestinal: No abdominal pain.  No nausea, no vomiting.  No diarrhea.  No constipation. Genitourinary: Negative for dysuria. Musculoskeletal: Negative for back pain.  Positive right shoulder pain Skin: Negative for rash. Neurological: Negative for headaches, focal weakness or numbness. All other ROS negative ____________________________________________   PHYSICAL EXAM:  VITAL SIGNS: ED Triage Vitals  Enc Vitals Group  BP 01/27/19 1607 (!) 142/96     Pulse Rate 01/27/19 1604 66     Resp 01/27/19 1604 16     Temp 01/27/19 1604 98.1 F (36.7 C)     Temp Source 01/27/19 1604 Oral     SpO2 01/27/19 1604 100 %     Weight 01/27/19 1605 226 lb (102.5 kg)     Height 01/27/19 1605 6\' 1"  (1.854 m)     Head Circumference --      Peak Flow --      Pain Score 01/27/19 1604 10     Pain Loc --      Pain Edu? --      Excl. in [place]? --     Constitutional: Alert and oriented. Well appearing and in no acute distress. Eyes: Conjunctivae are normal. EOMI. Head: Atraumatic. Nose: No  congestion/rhinnorhea. Mouth/Throat: Mucous membranes are moist.   Neck: No stridor. Trachea Midline. FROM Cardiovascular: Normal rate, regular rhythm. Grossly normal heart sounds.  Good peripheral circulation. Respiratory: Normal respiratory effort.  No retractions. Lungs CTAB. Gastrointestinal: Soft and nontender. No distention. No abdominal bruits.  Musculoskeletal: Palpated humeral head anteriorly, decreased range of motion due to pain, neurovascularly intact.  No other pain in the hand, wrist, forearm, elbow. Neurologic:  Normal speech and language. No gross focal neurologic deficits are appreciated.  Skin:  Skin is warm, dry and intact. No rash noted. Psychiatric: Mood and affect are normal. Speech and behavior are normal. GU: Deferred   _____RADIOLOGY I, Vanessa Impact, personally viewed and evaluated these images (plain radiographs) as part of my medical decision making, as well as reviewing the written report by the radiologist.  ED MD interpretation: Initial x-ray is concerning for dislocation.  Repeat x-ray the dislocation has been reduced no obvious fracture.  Official radiology report(s): Dg Shoulder Right  Result Date: 01/27/2019 CLINICAL DATA:  Fall EXAM: RIGHT SHOULDER - 2+ VIEW COMPARISON:  None. FINDINGS: Anterior dislocation of the right humeral head at the glenohumeral joint. No evidence of acromioclavicular separation. No fracture. No suspicious focal osseous lesion. Mild AC joint osteoarthritis. No radiopaque foreign body. IMPRESSION: Anterior right shoulder dislocation. No evidence of fracture. Postreduction films recommended. Mild AC joint osteoarthritis. Electronically Signed   By: Ilona Sorrel M.D.   On: 01/27/2019 17:21    ____________________________________________   PROCEDURES  Procedure(s) performed (including Critical Care):  .Sedation  Date/Time: 01/27/2019 6:04 PM Performed by: Vanessa [place], MD Authorized by: Vanessa [place], MD   Consent:    Consent  obtained:  Verbal and written   Consent given by:  Patient   Risks discussed:  Allergic reaction, dysrhythmia, inadequate sedation, nausea, prolonged hypoxia resulting in organ damage, prolonged sedation necessitating reversal, respiratory compromise necessitating ventilatory assistance and intubation and vomiting   Alternatives discussed:  Analgesia without sedation, anxiolysis and regional anesthesia Universal protocol:    Procedure explained and questions answered to patient or proxy's satisfaction: yes     Relevant documents present and verified: yes     Test results available and properly labeled: yes     Imaging studies available: yes     Required blood products, implants, devices, and special equipment available: yes     Site/side marked: yes     Immediately prior to procedure a time out was called: yes     Patient identity confirmation method:  Verbally with patient Indications:    Procedure necessitating sedation performed by:  Physician performing sedation Pre-sedation assessment:    Time since  last food or drink:  5 hours    ASA classification: class 1 - normal, healthy patient     Neck mobility: normal     Mouth opening:  3 or more finger widths   Thyromental distance:  4 finger widths   Mallampati score:  II - soft palate, uvula, fauces visible   Pre-sedation assessments completed and reviewed: airway patency, cardiovascular function, hydration status, mental status, nausea/vomiting, pain level, respiratory function and temperature     Pre-sedation assessment completed:  01/27/2019 5:30 PM Immediate pre-procedure details:    Reassessment: Patient reassessed immediately prior to procedure     Reviewed: vital signs, relevant labs/tests and NPO status     Verified: bag valve mask available, emergency equipment available, intubation equipment available, IV patency confirmed, oxygen available and suction available   Procedure details (see MAR for exact dosages):    Preoxygenation:   Nasal cannula   Sedation:  Propofol and ketamine   Intra-procedure monitoring:  Blood pressure monitoring, cardiac monitor, continuous pulse oximetry, frequent LOC assessments, frequent vital sign checks and continuous capnometry   Intra-procedure events: none     Total Provider sedation time (minutes):  15 Post-procedure details:    Post-sedation assessment completed:  01/27/2019 6:05 PM   Attendance: Constant attendance by certified staff until patient recovered     Recovery: Patient returned to pre-procedure baseline     Post-sedation assessments completed and reviewed: airway patency, cardiovascular function, hydration status, mental status, nausea/vomiting, pain level, respiratory function and temperature     Patient is stable for discharge or admission: yes     Patient tolerance:  Tolerated well, no immediate complications .Ortho Injury Treatment  Date/Time: 01/27/2019 6:06 PM Performed by: Vanessa [place], MD Authorized by: Vanessa [place], MD   Consent:    Consent obtained:  Written   Consent given by:  Patient   Risks discussed:  Irreducible dislocation, fracture, nerve damage, recurrent dislocation, restricted joint movement, stiffness and vascular damage   Alternatives discussed:  No treatmentInjury location: shoulder Pre-procedure neurovascular assessment: neurovascularly intact Pre-procedure distal perfusion: normal Pre-procedure neurological function: normal Pre-procedure range of motion: normal  Anesthesia: Local anesthesia used: no  Patient sedated: Yes. Refer to sedation procedure documentation for details of sedation. Immobilization: sling Post-procedure neurovascular assessment: post-procedure neurovascularly intact Post-procedure distal perfusion: normal Post-procedure neurological function: normal Post-procedure range of motion: normal Patient tolerance: patient tolerated the procedure well with no immediate complications       ____________________________________________   INITIAL IMPRESSION / ASSESSMENT AND PLAN / ED COURSE  Gerald Collins was evaluated in Emergency Department on 01/27/2019 for the symptoms described in the history of present illness. He was evaluated in the context of the global COVID-19 pandemic, which necessitated consideration that the patient might be at risk for infection with the SARS-CoV-2 virus that causes COVID-19. Institutional protocols and algorithms that pertain to the evaluation of patients at risk for COVID-19 are in a state of rapid change based on information released by regulatory bodies including the CDC and federal and state organizations. These policies and algorithms were followed during the patient's care in the ED.    This is most concerning for dislocation however given the fall will get x-ray to ensure that there is no large fracture.  Patient not hit his head is to suggest epidural subdural hematoma.  No abdominal tenderness to suggest abdominal injury.  No other tenderness along the arm or any other extremities to suggest other fractures.  X-ray consistent with anterior dislocation.  Patient is neurovascularly intact.  Patient will be sedated with ketamine and propofol.  Patient's shoulder was easily reduced with the [facility] technique.  Will get repeat x-ray  Repeat exam patient is at baseline.  Patient has no neurovascular deficits.  Waiting on repeat xray  Attempted to call radiology but did not pick up.  7:47 PM discussed with family that the radiologist are having a hard time getting all images and that our radiologist had to come in-house.  I personally reviewed the repeat x-ray that does not show a recurrent dislocation had no obvious large fracture but I discussed with family that I am not a radiologist and cannot confirm this.  I offered to continue to wait for the radiologist to read it versus them to follow-up on my chart.  They would prefer to go home at this  time.  Patient is symptomatically a lot better and there is no palpable humeral head therefore I have a very high suspicion that this was successfully reduced based upon the x-ray.  Discussed sling care and f/u with ortho.  Instructed not to remove sling until f/u complete.  ____________________________________________   FINAL CLINICAL IMPRESSION(S) / ED DIAGNOSES   Final diagnoses:  Anterior shoulder dislocation, right, initial encounter      MEDICATIONS GIVEN DURING THIS VISIT:  Medications  fentaNYL (SUBLIMAZE) injection 100 mcg (100 mcg Intravenous Given 01/27/19 1643)  ketamine (KETALAR) injection 50 mg (50 mg Intravenous Given 01/27/19 1753)  propofol (DIPRIVAN) 10 mg/mL bolus/IV push 50 mg (50 mg Intravenous Given 01/27/19 1753)     ED Discharge Orders    None       Note:  This document was prepared using Dragon voice recognition software and may include unintentional dictation errors.   Vanessa [place], MD 01/27/19 2222

## 2019-01-27 NOTE — ED Triage Notes (Signed)
Pt had mechanical fall today that caused pt to fall on his right shoulder.

## 2019-01-27 NOTE — ED Notes (Signed)
Pt sitting in bed speaking with this RN in NAD, pt more alert at this time and reports pain has improved, wife at bedside

## 2019-01-27 NOTE — ED Notes (Signed)
Pt slumped over to right side with sling in place on right arm, pt reports pain 10/10 before receiving pain medicine, wife at bedside. PT a&ox4

## 2019-01-27 NOTE — Discharge Instructions (Addendum)
You should call the orthopedic surgeons to make a follow-up appointment   you should wear the sling until he follow-up with orthopedic surgeons.  He can do some of the exercises already discussed. Return to the ER if it pops out of place or you have any other concerns.

## 2019-01-28 ENCOUNTER — Encounter: Payer: Self-pay | Admitting: Family Medicine

## 2019-01-28 DIAGNOSIS — S43006A Unspecified dislocation of unspecified shoulder joint, initial encounter: Secondary | ICD-10-CM

## 2019-01-29 NOTE — Telephone Encounter (Signed)
Best number [phone] Pt called checking on referral

## 2019-02-09 ENCOUNTER — Encounter: Payer: Self-pay | Admitting: Family Medicine

## 2019-02-09 DIAGNOSIS — S43004A Unspecified dislocation of right shoulder joint, initial encounter: Secondary | ICD-10-CM | POA: Insufficient documentation

## 2019-03-03 ENCOUNTER — Other Ambulatory Visit: Payer: Self-pay | Admitting: Orthopedic Surgery

## 2019-03-03 DIAGNOSIS — M25511 Pain in right shoulder: Secondary | ICD-10-CM

## 2019-03-12 ENCOUNTER — Encounter: Payer: No Typology Code available for payment source | Admitting: Family Medicine

## 2019-03-17 ENCOUNTER — Ambulatory Visit
Admission: RE | Admit: 2019-03-17 | Discharge: 2019-03-17 | Disposition: A | Discharge status: Home / Self Care | Payer: No Typology Code available for payment source | Source: Ambulatory Visit | Attending: Orthopedic Surgery | Admitting: Orthopedic Surgery

## 2019-03-17 ENCOUNTER — Ambulatory Visit: Payer: No Typology Code available for payment source

## 2019-03-17 ENCOUNTER — Other Ambulatory Visit: Payer: Self-pay

## 2019-03-17 DIAGNOSIS — M25511 Pain in right shoulder: Secondary | ICD-10-CM

## 2019-03-17 MED ORDER — LIDOCAINE HCL (PF) 1 % IJ SOLN
5.0000 mL | Freq: Once | INTRAMUSCULAR | Status: AC
  Administered 2019-03-17: 5 mL
  Filled 2019-03-17: qty 5

## 2019-03-17 MED ORDER — GADOBUTROL 1 MMOL/ML IV SOLN
2.0000 mL | Freq: Once | INTRAVENOUS | Status: AC | PRN
  Administered 2019-03-17: 0.1 mL

## 2019-03-17 MED ORDER — SODIUM CHLORIDE (PF) 0.9 % IJ SOLN
10.0000 mL | INTRAMUSCULAR | Status: DC | PRN
  Administered 2019-03-17: 10 mL
  Filled 2019-03-17: qty 10

## 2019-03-17 MED ORDER — IOHEXOL 180 MG/ML  SOLN
20.0000 mL | Freq: Once | INTRAMUSCULAR | Status: AC | PRN
  Administered 2019-03-17: 10 mL

## 2019-03-26 ENCOUNTER — Other Ambulatory Visit
Admission: RE | Admit: 2019-03-26 | Discharge: 2019-03-26 | Disposition: A | Discharge status: Home / Self Care | Payer: No Typology Code available for payment source | Source: Ambulatory Visit | Attending: Orthopedic Surgery | Admitting: Orthopedic Surgery

## 2019-03-26 ENCOUNTER — Other Ambulatory Visit: Payer: Self-pay | Admitting: Orthopedic Surgery

## 2019-03-26 ENCOUNTER — Other Ambulatory Visit: Payer: Self-pay

## 2019-03-26 DIAGNOSIS — Z01810 Encounter for preprocedural cardiovascular examination: Secondary | ICD-10-CM | POA: Insufficient documentation

## 2019-03-26 DIAGNOSIS — R001 Bradycardia, unspecified: Secondary | ICD-10-CM | POA: Diagnosis not present

## 2019-03-26 NOTE — Patient Instructions (Signed)
Your procedure is scheduled on: 04-01-19 THURSDAY Report to Same Day Surgery 2nd floor medical mall [facility] Entrance-take elevator on left to 2nd floor.  Check in with surgery information desk.) To find out your arrival time please call [phone] between 1PM - 3PM on 03-31-19 [facility]  Remember: Instructions that are not followed completely may result in serious medical risk, up to and including death, or upon the discretion of your surgeon and anesthesiologist your surgery may need to be rescheduled.    _x___ 1. Do not eat food after midnight the night before your procedure. NO GUM OR CANDY AFTER MIDNIGHT. You may drink WATER up to 2 hours before you are scheduled to arrive at the hospital for your procedure.  Do not drink WATER within 2 hours of your scheduled arrival to the hospital.  Type 1 and type 2 diabetics should only drink water.   ____Ensure clear carbohydrate drink on the way to the hospital for bariatric patients  ____Ensure clear carbohydrate drink 3 hours before surgery.    __x__ 2. No Alcohol for 24 hours before or after surgery.   __x__3. No Smoking or e-cigarettes for 24 prior to surgery.  Do not use any chewable tobacco products for at least 6 hour prior to surgery   ____  4. Bring all medications with you on the day of surgery if instructed.    __x__ 5. Notify your doctor if there is any change in your medical condition     (cold, fever, infections).    x___6. On the morning of surgery brush your teeth with toothpaste and water.  You may rinse your mouth with mouth wash if you wish.  Do not swallow any toothpaste or mouthwash.   Do not wear jewelry, make-up, hairpins, clips or nail polish.  Do not wear lotions, powders, or perfumes.   Do not shave 48 hours prior to surgery. Men may shave face and neck.  Do not bring valuables to the hospital.    [facility] is not responsible for any belongings or valuables.               Contacts, dentures or  bridgework may not be worn into surgery.  Leave your suitcase in the car. After surgery it may be brought to your room.  For patients admitted to the hospital, discharge time is determined by your  treatment team.  _  Patients discharged the day of surgery will not be allowed to drive home.  You will need someone to drive you home and stay with you the night of your procedure.    Please read over the following fact sheets that you were given:   [facility] Preparing for Surgery   _x___ TAKE THE FOLLOWING MEDICATION THE MORNING OF SURGERY WITH A SMALL SIP OF WATER. These include:  1. LIPITOR (ATORVASTATIN)  2.  3.  4.  5.  6.  ____Fleets enema or Magnesium Citrate as directed.   _x___ Use CHG Soap or sage wipes as directed on instruction sheet   ____ Use inhalers on the day of surgery and bring to hospital day of surgery  ____ Stop Metformin and Janumet 2 days prior to surgery.    ____ Take 1/2 of usual insulin dose the night before surgery and none on the morning surgery.   ____ Follow recommendations from Cardiologist, Pulmonologist or PCP regarding stopping Aspirin, Coumadin, Plavix ,Eliquis, Effient, or Pradaxa, and Pletal.  X____Stop Anti-inflammatories such as Advil, Aleve, Ibuprofen, Motrin, Naproxen,MELOXICAM (MOBIC) Naprosyn,  Goodies powders or aspirin products NOW-OK to take Tylenol OR TRAMADOL IF NEEDED   ____ Stop supplements until after surgery   ____ Bring C-Pap to the hospital.

## 2019-03-29 ENCOUNTER — Other Ambulatory Visit
Admission: RE | Admit: 2019-03-29 | Discharge: 2019-03-29 | Disposition: A | Discharge status: Home / Self Care | Payer: No Typology Code available for payment source | Source: Ambulatory Visit | Attending: Orthopedic Surgery | Admitting: Orthopedic Surgery

## 2019-03-29 DIAGNOSIS — Z01812 Encounter for preprocedural laboratory examination: Secondary | ICD-10-CM | POA: Diagnosis not present

## 2019-03-29 DIAGNOSIS — Z20828 Contact with and (suspected) exposure to other viral communicable diseases: Secondary | ICD-10-CM | POA: Diagnosis not present

## 2019-03-29 LAB — SARS CORONAVIRUS 2 (TAT 6-24 HRS): SARS Coronavirus 2: NEGATIVE

## 2019-03-31 MED ORDER — CEFAZOLIN SODIUM-DEXTROSE 2-4 GM/100ML-% IV SOLN
2.0000 g | INTRAVENOUS | Status: AC
  Administered 2019-04-01: 2 g via INTRAVENOUS

## 2019-04-01 ENCOUNTER — Ambulatory Visit: Payer: No Typology Code available for payment source | Admitting: Anesthesiology

## 2019-04-01 ENCOUNTER — Ambulatory Visit
Admission: RE | Admit: 2019-04-01 | Discharge: 2019-04-01 | Disposition: A | Discharge status: Home / Self Care | Payer: No Typology Code available for payment source | Attending: Orthopedic Surgery | Admitting: Orthopedic Surgery

## 2019-04-01 ENCOUNTER — Encounter
Admission: RE | Disposition: A | Discharge status: Home / Self Care | Payer: Self-pay | Source: Home / Self Care | Attending: Orthopedic Surgery

## 2019-04-01 ENCOUNTER — Ambulatory Visit: Payer: No Typology Code available for payment source

## 2019-04-01 ENCOUNTER — Encounter: Payer: Self-pay | Admitting: *Deleted

## 2019-04-01 ENCOUNTER — Other Ambulatory Visit: Payer: Self-pay

## 2019-04-01 DIAGNOSIS — M19011 Primary osteoarthritis, right shoulder: Secondary | ICD-10-CM | POA: Diagnosis not present

## 2019-04-01 DIAGNOSIS — S43491A Other sprain of right shoulder joint, initial encounter: Secondary | ICD-10-CM | POA: Diagnosis not present

## 2019-04-01 DIAGNOSIS — Z87891 Personal history of nicotine dependence: Secondary | ICD-10-CM | POA: Diagnosis not present

## 2019-04-01 DIAGNOSIS — M75121 Complete rotator cuff tear or rupture of right shoulder, not specified as traumatic: Secondary | ICD-10-CM | POA: Diagnosis not present

## 2019-04-01 DIAGNOSIS — E119 Type 2 diabetes mellitus without complications: Secondary | ICD-10-CM | POA: Insufficient documentation

## 2019-04-01 DIAGNOSIS — I10 Essential (primary) hypertension: Secondary | ICD-10-CM | POA: Insufficient documentation

## 2019-04-01 DIAGNOSIS — E785 Hyperlipidemia, unspecified: Secondary | ICD-10-CM | POA: Diagnosis not present

## 2019-04-01 DIAGNOSIS — W010XXA Fall on same level from slipping, tripping and stumbling without subsequent striking against object, initial encounter: Secondary | ICD-10-CM | POA: Diagnosis not present

## 2019-04-01 DIAGNOSIS — M25811 Other specified joint disorders, right shoulder: Secondary | ICD-10-CM | POA: Diagnosis not present

## 2019-04-01 HISTORY — PX: SHOULDER ARTHROSCOPY WITH OPEN ROTATOR CUFF REPAIR: SHX6092

## 2019-04-01 LAB — GLUCOSE, CAPILLARY
Glucose-Capillary: 142 mg/dL — ABNORMAL HIGH (ref 70–99)
Glucose-Capillary: 132 mg/dL — ABNORMAL HIGH (ref 70–99)

## 2019-04-01 SURGERY — ARTHROSCOPY, SHOULDER WITH REPAIR, ROTATOR CUFF, OPEN
Anesthesia: General | Site: Shoulder | Laterality: Right

## 2019-04-01 MED ORDER — ACETAMINOPHEN 10 MG/ML IV SOLN
INTRAVENOUS | Status: AC
  Filled 2019-04-01: qty 100

## 2019-04-01 MED ORDER — EPINEPHRINE PF 1 MG/ML IJ SOLN
INTRAMUSCULAR | Status: AC
  Filled 2019-04-01: qty 4

## 2019-04-01 MED ORDER — ROCURONIUM BROMIDE 50 MG/5ML IV SOLN
INTRAVENOUS | Status: AC
  Filled 2019-04-01: qty 1

## 2019-04-01 MED ORDER — ACETAMINOPHEN 10 MG/ML IV SOLN
INTRAVENOUS | Status: DC | PRN
  Administered 2019-04-01: 1000 mg via INTRAVENOUS

## 2019-04-01 MED ORDER — HYDROCODONE-ACETAMINOPHEN 7.5-325 MG PO TABS: 1.0000 | ORAL_TABLET | Freq: Once | ORAL | Status: DC | PRN

## 2019-04-01 MED ORDER — DEXAMETHASONE SODIUM PHOSPHATE 10 MG/ML IJ SOLN
INTRAMUSCULAR | Status: DC | PRN
  Administered 2019-04-01: 4 mg via INTRAVENOUS

## 2019-04-01 MED ORDER — FENTANYL CITRATE (PF) 100 MCG/2ML IJ SOLN
100.0000 ug | Freq: Once | INTRAMUSCULAR | Status: AC
  Administered 2019-04-01: 07:00:00 100 ug via INTRAVENOUS

## 2019-04-01 MED ORDER — ONDANSETRON HCL 4 MG PO TABS: 4.0000 mg | ORAL_TABLET | Freq: Three times a day (TID) | ORAL | 0 refills | Status: DC | PRN

## 2019-04-01 MED ORDER — MIDAZOLAM HCL 2 MG/2ML IJ SOLN
2.0000 mg | Freq: Once | INTRAMUSCULAR | Status: AC
  Administered 2019-04-01: 07:00:00 2 mg via INTRAVENOUS

## 2019-04-01 MED ORDER — OXYCODONE HCL 5 MG PO TABS: 5.0000 mg | ORAL_TABLET | ORAL | 0 refills | Status: DC | PRN

## 2019-04-01 MED ORDER — ACETAMINOPHEN 325 MG PO TABS: 325.0000 mg | ORAL_TABLET | ORAL | Status: DC | PRN

## 2019-04-01 MED ORDER — FAMOTIDINE 20 MG PO TABS
20.0000 mg | ORAL_TABLET | Freq: Once | ORAL | Status: AC
  Administered 2019-04-01: 07:00:00 20 mg via ORAL

## 2019-04-01 MED ORDER — SUGAMMADEX SODIUM 200 MG/2ML IV SOLN
INTRAVENOUS | Status: DC | PRN
  Administered 2019-04-01: 212 mg via INTRAVENOUS

## 2019-04-01 MED ORDER — FENTANYL CITRATE (PF) 100 MCG/2ML IJ SOLN
INTRAMUSCULAR | Status: DC | PRN
  Administered 2019-04-01 (×2): 25 ug via INTRAVENOUS
  Administered 2019-04-01: 50 ug via INTRAVENOUS

## 2019-04-01 MED ORDER — CHLORHEXIDINE GLUCONATE CLOTH 2 % EX PADS: 6.0000 | MEDICATED_PAD | Freq: Once | CUTANEOUS | Status: DC

## 2019-04-01 MED ORDER — SUCCINYLCHOLINE CHLORIDE 20 MG/ML IJ SOLN
INTRAMUSCULAR | Status: DC | PRN
  Administered 2019-04-01: 140 mg via INTRAVENOUS

## 2019-04-01 MED ORDER — SODIUM CHLORIDE 0.9 % IV SOLN
INTRAVENOUS | Status: DC | PRN
  Administered 2019-04-01: 35 ug/min via INTRAVENOUS

## 2019-04-01 MED ORDER — BUPIVACAINE LIPOSOME 1.3 % IJ SUSP
INTRAMUSCULAR | Status: DC | PRN
  Administered 2019-04-01: 20 mL

## 2019-04-01 MED ORDER — SODIUM CHLORIDE 0.9 % IV SOLN
INTRAVENOUS | Status: DC
  Administered 2019-04-01: 07:00:00 via INTRAVENOUS

## 2019-04-01 MED ORDER — BUPIVACAINE LIPOSOME 1.3 % IJ SUSP
INTRAMUSCULAR | Status: AC
  Filled 2019-04-01: qty 20

## 2019-04-01 MED ORDER — DEXAMETHASONE SODIUM PHOSPHATE 10 MG/ML IJ SOLN
INTRAMUSCULAR | Status: AC
  Filled 2019-04-01: qty 1

## 2019-04-01 MED ORDER — SUCCINYLCHOLINE CHLORIDE 20 MG/ML IJ SOLN
INTRAMUSCULAR | Status: AC
  Filled 2019-04-01: qty 1

## 2019-04-01 MED ORDER — ROCURONIUM BROMIDE 100 MG/10ML IV SOLN
INTRAVENOUS | Status: DC | PRN
  Administered 2019-04-01: 20 mg via INTRAVENOUS
  Administered 2019-04-01: 10 mg via INTRAVENOUS
  Administered 2019-04-01: 40 mg via INTRAVENOUS
  Administered 2019-04-01: 20 mg via INTRAVENOUS
  Administered 2019-04-01: 30 mg via INTRAVENOUS
  Administered 2019-04-01: 10 mg via INTRAVENOUS

## 2019-04-01 MED ORDER — PROMETHAZINE HCL 25 MG/ML IJ SOLN
INTRAMUSCULAR | Status: AC
  Administered 2019-04-01: 6.25 mg via INTRAVENOUS
  Filled 2019-04-01: qty 1

## 2019-04-01 MED ORDER — LIDOCAINE HCL (PF) 2 % IJ SOLN
INTRAMUSCULAR | Status: AC
  Filled 2019-04-01: qty 10

## 2019-04-01 MED ORDER — CEFAZOLIN SODIUM-DEXTROSE 2-4 GM/100ML-% IV SOLN
INTRAVENOUS | Status: AC
  Filled 2019-04-01: qty 100

## 2019-04-01 MED ORDER — LIDOCAINE HCL (CARDIAC) PF 100 MG/5ML IV SOSY
PREFILLED_SYRINGE | INTRAVENOUS | Status: DC | PRN
  Administered 2019-04-01: 100 mg via INTRAVENOUS

## 2019-04-01 MED ORDER — MEPERIDINE HCL 50 MG/ML IJ SOLN: 6.2500 mg | INTRAMUSCULAR | Status: DC | PRN

## 2019-04-01 MED ORDER — FENTANYL CITRATE (PF) 100 MCG/2ML IJ SOLN
INTRAMUSCULAR | Status: AC
  Administered 2019-04-01: 100 ug via INTRAVENOUS
  Filled 2019-04-01: qty 2

## 2019-04-01 MED ORDER — FAMOTIDINE 20 MG PO TABS
ORAL_TABLET | ORAL | Status: AC
  Administered 2019-04-01: 20 mg via ORAL
  Filled 2019-04-01: qty 1

## 2019-04-01 MED ORDER — GLYCOPYRROLATE 0.2 MG/ML IJ SOLN
INTRAMUSCULAR | Status: AC
  Filled 2019-04-01: qty 1

## 2019-04-01 MED ORDER — BUPIVACAINE HCL (PF) 0.5 % IJ SOLN
INTRAMUSCULAR | Status: AC
  Filled 2019-04-01: qty 10

## 2019-04-01 MED ORDER — BUPIVACAINE HCL (PF) 0.5 % IJ SOLN
INTRAMUSCULAR | Status: DC | PRN
  Administered 2019-04-01: 10 mL

## 2019-04-01 MED ORDER — SUGAMMADEX SODIUM 200 MG/2ML IV SOLN
INTRAVENOUS | Status: AC
  Filled 2019-04-01: qty 2

## 2019-04-01 MED ORDER — LACTATED RINGERS IV SOLN
INTRAVENOUS | Status: DC | PRN
  Administered 2019-04-01: 4 mL

## 2019-04-01 MED ORDER — LIDOCAINE HCL (PF) 1 % IJ SOLN
INTRAMUSCULAR | Status: AC
  Filled 2019-04-01: qty 5

## 2019-04-01 MED ORDER — ACETAMINOPHEN 160 MG/5ML PO SOLN
325.0000 mg | ORAL | Status: DC | PRN
  Filled 2019-04-01: qty 20.3

## 2019-04-01 MED ORDER — PROPOFOL 10 MG/ML IV BOLUS
INTRAVENOUS | Status: DC | PRN
  Administered 2019-04-01: 200 mg via INTRAVENOUS

## 2019-04-01 MED ORDER — ONDANSETRON HCL 4 MG/2ML IJ SOLN
INTRAMUSCULAR | Status: AC
  Filled 2019-04-01: qty 2

## 2019-04-01 MED ORDER — PROPOFOL 10 MG/ML IV BOLUS
INTRAVENOUS | Status: AC
  Filled 2019-04-01: qty 40

## 2019-04-01 MED ORDER — PHENYLEPHRINE HCL (PRESSORS) 10 MG/ML IV SOLN
INTRAVENOUS | Status: AC
  Filled 2019-04-01: qty 1

## 2019-04-01 MED ORDER — ONDANSETRON HCL 4 MG/2ML IJ SOLN
INTRAMUSCULAR | Status: DC | PRN
  Administered 2019-04-01: 4 mg via INTRAVENOUS

## 2019-04-01 MED ORDER — BUPIVACAINE HCL (PF) 0.25 % IJ SOLN
INTRAMUSCULAR | Status: AC
  Filled 2019-04-01: qty 30

## 2019-04-01 MED ORDER — FENTANYL CITRATE (PF) 100 MCG/2ML IJ SOLN
INTRAMUSCULAR | Status: AC
  Filled 2019-04-01: qty 2

## 2019-04-01 MED ORDER — LIDOCAINE HCL (PF) 1 % IJ SOLN
INTRAMUSCULAR | Status: AC
  Filled 2019-04-01: qty 30

## 2019-04-01 MED ORDER — MIDAZOLAM HCL 2 MG/2ML IJ SOLN
INTRAMUSCULAR | Status: AC
  Administered 2019-04-01: 2 mg via INTRAVENOUS
  Filled 2019-04-01: qty 2

## 2019-04-01 MED ORDER — PROMETHAZINE HCL 25 MG/ML IJ SOLN
6.2500 mg | INTRAMUSCULAR | Status: DC | PRN
  Administered 2019-04-01: 12:00:00 6.25 mg via INTRAVENOUS

## 2019-04-01 MED ORDER — LIDOCAINE HCL 1 % IJ SOLN
INTRAMUSCULAR | Status: AC
  Filled 2019-04-01: qty 2

## 2019-04-01 MED ORDER — PHENYLEPHRINE HCL (PRESSORS) 10 MG/ML IV SOLN
INTRAVENOUS | Status: DC | PRN
  Administered 2019-04-01: 100 ug via INTRAVENOUS

## 2019-04-01 SURGICAL SUPPLY — 74 items
ADAPTER IRRIG TUBE 2 SPIKE SOL (ADAPTER) ×4 IMPLANT
ANCHOR ALL-SUT Q-FIX 2.8 (Anchor) ×4 IMPLANT
ANCHOR SUT 5.5 MULTIFIX (Orthopedic Implant) ×4 IMPLANT
ANCHOR SUT BIOC ST 3X145 (Anchor) ×4 IMPLANT
BUR RADIUS 4.0X18.5 (BURR) ×2 IMPLANT
BUR RADIUS 5.5 (BURR) ×2 IMPLANT
CANNULA 5.75X7 CRYSTAL CLEAR (CANNULA) ×4 IMPLANT
CANNULA PARTIAL THREAD 2X7 (CANNULA) ×2 IMPLANT
CANNULA TWIST IN 8.25X9CM (CANNULA) IMPLANT
CONNECTOR PERFECT PASSER (CONNECTOR) ×2 IMPLANT
COOLER POLAR GLACIER W/PUMP (MISCELLANEOUS) ×2 IMPLANT
COVER WAND RF STERILE (DRAPES) ×2 IMPLANT
CRADLE LAMINECT ARM (MISCELLANEOUS) ×2 IMPLANT
DEVICE SUCT BLK HOLE OR FLOOR (MISCELLANEOUS) IMPLANT
DRAPE 3/4 80X56 (DRAPES) ×2 IMPLANT
DRAPE IMP U-DRAPE 54X76 (DRAPES) ×4 IMPLANT
DRAPE INCISE IOBAN 66X45 STRL (DRAPES) ×2 IMPLANT
DRAPE U-SHAPE 47X51 STRL (DRAPES) IMPLANT
DURAPREP 26ML APPLICATOR (WOUND CARE) ×6 IMPLANT
ELECT REM PT RETURN 9FT ADLT (ELECTROSURGICAL) ×2
ELECTRODE REM PT RTRN 9FT ADLT (ELECTROSURGICAL) ×1 IMPLANT
GAUZE SPONGE 4X4 12PLY STRL (GAUZE/BANDAGES/DRESSINGS) ×4 IMPLANT
GAUZE XEROFORM 1X8 LF (GAUZE/BANDAGES/DRESSINGS) ×2 IMPLANT
GLOVE BIOGEL PI IND STRL 9 (GLOVE) ×1 IMPLANT
GLOVE BIOGEL PI INDICATOR 9 (GLOVE) ×1
GLOVE SURG 9.0 ORTHO LTXF (GLOVE) ×4 IMPLANT
GOWN STRL REUS TWL 2XL XL LVL4 (GOWN DISPOSABLE) ×2 IMPLANT
GOWN STRL REUS W/ TWL LRG LVL3 (GOWN DISPOSABLE) ×1 IMPLANT
GOWN STRL REUS W/ TWL LRG LVL4 (GOWN DISPOSABLE) ×1 IMPLANT
GOWN STRL REUS W/TWL LRG LVL3 (GOWN DISPOSABLE) ×1
GOWN STRL REUS W/TWL LRG LVL4 (GOWN DISPOSABLE) ×1
IV LACTATED RINGER IRRG 3000ML (IV SOLUTION) ×12
IV LR IRRIG 3000ML ARTHROMATIC (IV SOLUTION) ×12 IMPLANT
KIT STABILIZATION SHOULDER (MISCELLANEOUS) ×2 IMPLANT
KIT SUTURE 2.8 Q-FIX DISP (MISCELLANEOUS) ×2 IMPLANT
KIT SUTURETAK 3.0 INSERT PERC (KITS) ×2 IMPLANT
KIT TURNOVER KIT A (KITS) ×2 IMPLANT
MANIFOLD NEPTUNE II (INSTRUMENTS) ×2 IMPLANT
MASK FACE SPIDER DISP (MASK) ×2 IMPLANT
MAT ABSORB  FLUID 56X50 GRAY (MISCELLANEOUS) ×2
MAT ABSORB FLUID 56X50 GRAY (MISCELLANEOUS) ×2 IMPLANT
NDL SAFETY ECLIPSE 18X1.5 (NEEDLE) ×1 IMPLANT
NEEDLE HYPO 18GX1.5 SHARP (NEEDLE) ×1
NEEDLE HYPO 22GX1.5 SAFETY (NEEDLE) ×2 IMPLANT
NS IRRIG 500ML POUR BTL (IV SOLUTION) ×2 IMPLANT
PACK ARTHROSCOPY SHOULDER (MISCELLANEOUS) ×2 IMPLANT
PAD WRAPON POLAR SHDR XLG (MISCELLANEOUS) ×1 IMPLANT
PASSER SUT CAPTURE FIRST (SUTURE) IMPLANT
PASSER SUT FIRSTPASS SELF (INSTRUMENTS) ×2 IMPLANT
SET TUBE SUCT SHAVER OUTFL 24K (TUBING) ×2 IMPLANT
SET TUBE TIP INTRA-ARTICULAR (MISCELLANEOUS) ×2 IMPLANT
STRAP SAFETY 5IN WIDE (MISCELLANEOUS) ×2 IMPLANT
STRIP CLOSURE SKIN 1/2X4 (GAUZE/BANDAGES/DRESSINGS) ×4 IMPLANT
SUT ETHILON 4-0 (SUTURE) ×2
SUT ETHILON 4-0 FS2 18XMFL BLK (SUTURE) ×2
SUT FIBERWIRE #2 38 T-5 BLUE (SUTURE) ×2
SUT LASSO 90 DEG SD STR (SUTURE) ×2 IMPLANT
SUT MNCRL 4-0 (SUTURE) ×1
SUT MNCRL 4-0 27XMFL (SUTURE) ×1
SUT PDS AB 0 CT1 27 (SUTURE) ×2 IMPLANT
SUT PERFECTPASSER WHITE CART (SUTURE) ×6 IMPLANT
SUT SMART STITCH CARTRIDGE (SUTURE) ×4 IMPLANT
SUT VIC AB 0 CT1 36 (SUTURE) ×2 IMPLANT
SUT VIC AB 2-0 CT2 27 (SUTURE) ×2 IMPLANT
SUTURE ETHLN 4-0 FS2 18XMF BLK (SUTURE) ×2 IMPLANT
SUTURE FIBERWR #2 38 T-5 BLUE (SUTURE) ×1 IMPLANT
SUTURE MAGNUM WIRE 2X48 BLK (SUTURE) IMPLANT
SUTURE MNCRL 4-0 27XMF (SUTURE) ×1 IMPLANT
SYR 10ML LL (SYRINGE) ×2 IMPLANT
TAPE MICROFOAM 4IN (TAPE) ×2 IMPLANT
TUBING ARTHRO INFLOW-ONLY STRL (TUBING) ×2 IMPLANT
TUBING CONNECTING 10 (TUBING) ×2 IMPLANT
WAND HAND CNTRL MULTIVAC 90 (MISCELLANEOUS) ×2 IMPLANT
WRAPON POLAR PAD SHDR XLG (MISCELLANEOUS) ×2

## 2019-04-01 NOTE — Anesthesia Preprocedure Evaluation (Addendum)
Anesthesia Evaluation  Patient identified by MRN, date of birth, ID band Patient awake    Reviewed: Allergy & Precautions, H&P , NPO status , reviewed documented beta blocker date and time   Airway Mallampati: II  TM Distance: >3 FB Neck ROM: full    Dental  (+) Teeth Intact   Pulmonary former smoker,    Pulmonary exam normal        Cardiovascular hypertension, Normal cardiovascular exam     Neuro/Psych    GI/Hepatic neg GERD  ,  Endo/Other  diabetes  Renal/GU      Musculoskeletal   Abdominal   Peds  Hematology   Anesthesia Other Findings Past Medical History: 2012: Diabetes type 2, controlled ([place])     Comment:  controlled with lifestyle (diet, exercise) change 06/11/1987: H/O: pneumonia No date: HLD (hyperlipidemia) No date: HTN (hypertension)     Comment:  no meds  No date: Other specified visual disturbances Past Surgical History: 09/2015: COLONOSCOPY     Comment:  hyperplastic polpy rpt 10 yrs Henrene Pastor) 1998: VASECTOMY BMI    Body Mass Index: 30.83 kg/m     Reproductive/Obstetrics                            Anesthesia Physical Anesthesia Plan  ASA: II  Anesthesia Plan: General   Post-op Pain Management:  Regional for Post-op pain   Induction: Intravenous  PONV Risk Score and Plan: 2 and Ondansetron, Treatment may vary due to age or medical condition, Dexamethasone and Midazolam  Airway Management Planned: Oral ETT  Additional Equipment:   Intra-op Plan:   Post-operative Plan: Extubation in OR  Informed Consent: I have reviewed the patients History and Physical, chart, labs and discussed the procedure including the risks, benefits and alternatives for the proposed anesthesia with the patient or authorized representative who has indicated his/her understanding and acceptance.     Dental Advisory Given  Plan Discussed with: CRNA  Anesthesia Plan Comments:          Anesthesia Quick Evaluation

## 2019-04-01 NOTE — Anesthesia Procedure Notes (Signed)
Anesthesia Regional Block: Interscalene brachial plexus block   Pre-Anesthetic Checklist: ,, timeout performed, Correct Patient, Correct Site, Correct Laterality, Correct Procedure, Correct Position, site marked, Risks and benefits discussed,  Surgical consent,  Pre-op evaluation,  At surgeon's request and post-op pain management  Laterality: Right  Prep: chloraprep       Needles:  Injection technique: Single-shot  Needle Type: Echogenic Stimulator Needle     Needle Length: 10cm  Needle Gauge: 21     Additional Needles:   Procedures: Doppler guided,,,, ultrasound used (permanent image in chart),,,,  Narrative:  Start time: 04/01/2019 7:24 AM End time: 04/01/2019 7:45 AM Injection made incrementally with aspirations every 7 mL.  Performed by: Personally  Anesthesiologist: Alphonsus Sias, MD  Additional Notes: Functioning IV was confirmed, O2 and monitors were applied.  A 172mm 21ga Stimuplex needle was used. Sterile prep and drape,hand hygiene and sterile gloves were used.  Negative aspiration and negative test dose prior to incremental administration of local anesthetic. The patient tolerated the procedure well. LA: 4cc 1% lido skin, 30cc block (54ml exparel, 31ml 0.5% bupiv). Images stored.

## 2019-04-01 NOTE — Discharge Instructions (Signed)

## 2019-04-01 NOTE — Op Note (Signed)
04/01/2019  4:45 PM  PATIENT:  Gerald Collins  53 y.o. male  PRE-OPERATIVE DIAGNOSIS:  M75.121 complete rotator cuff tear or rupture of right shoulder not specified as traumatic  POST-OPERATIVE DIAGNOSIS: Right shoulder full-thickness supraspinatus tear with retraction, anterior inferior labral tear, subacromial impingement and acromioclavicular arthrosis  PROCEDURE:  Procedure(s): RIGHT SHOULDER ARTHROSCOPY, ARTHROSCOPIC LABRAL REPAIR, SUBACROMIAL DECOMPRESSION, DISTAL CLAVICLE EXCISION, MINI OPEN ROTATOR CUFF REPAIR (Right)  SURGEON:  Surgeon(s) and Role:    Thornton Park, MD - Primary  ANESTHESIA:   general and paracervical block   PREOPERATIVE INDICATIONS:  Gerald Collins is a  53 y.o. male who is status post right anterior shoulder dislocation status post fall with a diagnosis of M75.121 complete rotator cuff tear or rupture of right shoulder confirmed by MR, who failed conservative treatment and has elected for surgical repair.    The risks benefits and alternatives were discussed with the patient preoperatively including but not limited to the risks of infection, bleeding, nerve injury, persistent pain or weakness, failure of the hardware, re-tear of the rotator cuff and the need for further surgery. Medical risks include DVT and pulmonary embolism, myocardial infarction, stroke, pneumonia, respiratory failure and death. Patient understood these risks and wished to proceed.  OPERATIVE IMPLANTS: [place] Multifix anchors x 2 & [employer] Q Fix anchors x 2  OPERATIVE FINDINGS: Full-thickness supraspinatus tear with torn anterior inferior labrum  OPERATIVE PROCEDURE: The patient was met in the preoperative area.  A preop history and physical was performed at the bedside.  The right shoulder was signed with the word yes and my initials according the hospital's correct site of surgery protocol.  Patient underwent an interscalene block with Exparel by the anesthesia  service.  Patient was brought to the operating room where he underwent general anesthesia.  The patient was placed in a beachchair position.  A spider arm positioner was used for this case. Examination under anesthesia revealed increased anterior translation with load-and-shift testing. The patient had a negative sulcus sign.  Patient was prepped and draped in a sterile fashion. A timeout was performed to verify the patient's name, date of birth, medical record number, correct site of surgery and correct procedure to be performed there was also used to verify the patient received antibiotics that all appropriate instruments, implants and radiographs studies were available in the room. Once all in attendance were in agreement case began.  Bony landmarks were drawn out with a surgical marker along with proposed arthroscopy incisions.  An 11 blade was used to establish a posterior portal through which the arthroscope was placed in the glenohumeral joint. A full diagnostic examination of the shoulder was performed.  Patient was found to have a full-thickness tear involving the supraspinatus as well as an anterior inferior labral tear.   The anterior labrum was not well visualized.  The patient had increased anterior translation.  There appeared to be laxity of the inferior glenohumeral ligament.  The decision was made to perform a capsulorrhaphy.  An arthroscopic elevator was used to mobilize a diminutive anterior labrum and anterior capsule off the anterior face of the glenoid.  An 4.0 mm resector shaver blade was then used to debride the interval between the bony glenoid and anterior inferior glenohumeral ligament.  An anterolateral portal was created under direct visualization using 18-gauge spinal needle.  A 7 mm cannula was placed through this incision.  Two Arthrex bio suturetak anchors were then placed at the 430 and 3  o'clock position on the anterior inferior labrum.  A 90 degree suture lasso was used to  shuttle 1 limb of each of these anchors under the anterior labrum/ inferior glenohumeral ligament.  A successful capsulorrhaphy was achieved with these 2 anchors creating a soft tissue bumper anteriorly and improved glenohumeral joint stability.  Arthroscopic images of the repair were taken.  The arthroscope was then placed in the subacromial space. A lateral portal was established under direct visualization using an 18-gauge spinal needle. A subacromial decompression was also performed using a 5.5 mm resector shaver blade from the lateral portal.  The 5.5 mm resector shaver blade was then placed to the anterior portal and a distal clavicle excision was performed.  A 4.0 mm resector shaver blade was then placed to the lateral portal and the greater tuberosity was debrided of all torn fibers of the rotator cuff.  Four Perfect Pass suture were placed in the lateral border of the rotator cuff tear.  All arthroscopic images were taken.  All arthroscopic instruments were then removed and the mini-open portion of the procedure began.  A saber-type incision was made along the lateral border of the acromion. The deltoid muscle was identified and split in line with its fibers which allowed visualization of the rotator cuff. The Perfect Pass sutures previously placed in the lateral border of the rotator cuff were brought out through the deltoid split.  Two [employer] anchor was placed at the articular margin of the humeral head with the greater tuberosity. The suture limbs of the Q Fix anchor were passed medially through the rotator cuff using a First Pass suture passer.   The Perfect Pass sutures were then anchored to the greater tuberosity footprint using two [place] Multifix anchors. These anchors were tensioned to allow for anatomic reduction of the rotator cuff to the greater tuberosity. The medial row sutures were then tied down using an arthroscopic knot tying technique.  Arthroscopic images of  the repair were taken with the arthroscope both externally and from inside the glenohumeral joint.  All incisions were copiously irrigated. The deltoid fascia was repaired using a 0 Vicryl suture.  The subcutaneous tissue of the saber incision was closed with a 2-0 Vicryl. Skin closure for the arthroscopic incisions was performed with 4-0 nylon. The skin edges of the saber incision was approximated with a running 4-0 undyed Monocryl.  A dry sterile dressing was applied.  The patient was placed in an abduction sling and a Polar Care was applied to the shoulder.  All sharp, sponge and it instrument counts were correct at the conclusion of the case. I was scrubbed and present for the entire case. I spoke with the patient's wife postoperatively to let her know the case was performed without complication and the patient was stable in recovery room.

## 2019-04-01 NOTE — Transfer of Care (Signed)
Immediate Anesthesia Transfer of Care Note  Patient: Gerald Collins  Procedure(s) Performed: RIGHT SHOULDER ARTHROSCOPY, ARTHROSCOPIC LABRAL REPAIR, SUBACROMIAL DECOMPRESSION, DISTAL CLAVICLE EXCISION, MINI OPEN ROTATOR CUFF REPAIR (Right Shoulder)  Patient Location: PACU  Anesthesia Type:General  Level of Consciousness: drowsy  Airway & Oxygen Therapy: Patient Spontanous Breathing and Patient connected to face mask oxygen  Post-op Assessment: Report given to RN and Post -op Vital signs reviewed and stable  Post vital signs: Reviewed and stable  Last Vitals:  Vitals Value Taken Time  BP 109/66 04/01/19 1121  Temp    Pulse 59 04/01/19 1123  Resp 22 04/01/19 1123  SpO2 98 % 04/01/19 1123  Vitals shown include unvalidated device data.  Last Pain:  Vitals:   04/01/19 0613  TempSrc: Tympanic         Complications: No apparent anesthesia complications

## 2019-04-01 NOTE — Anesthesia Procedure Notes (Signed)
Procedure Name: Intubation Performed by: Demetrius Charity, CRNA Pre-anesthesia Checklist: Patient identified, Patient being monitored, Timeout performed, Emergency Drugs available and Suction available Patient Re-evaluated:Patient Re-evaluated prior to induction Oxygen Delivery Method: Circle system utilized Preoxygenation: Pre-oxygenation with 100% oxygen Induction Type: IV induction Ventilation: Mask ventilation without difficulty Laryngoscope Size: McGraph and 4 Grade View: Grade II Tube type: Oral Tube size: 7.5 mm Number of attempts: 1 Airway Equipment and Method: Stylet Placement Confirmation: ETT inserted through vocal cords under direct vision,  positive ETCO2 and breath sounds checked- equal and bilateral Secured at: 23 cm Tube secured with: Tape Dental Injury: Teeth and Oropharynx as per pre-operative assessment

## 2019-04-01 NOTE — Anesthesia Post-op Follow-up Note (Signed)
Anesthesia QCDR form completed.        

## 2019-04-01 NOTE — H&P (Signed)
PREOPERATIVE H&P  Chief Complaint: M75.121 complete rotator cuff tear or rupture of right shoulder not specified as traumatic  HPI: Gerald Collins is a 53 y.o. male who presents for preoperative history and physical with a diagnosis of a full thickness rotator cuff tear of right shoulder after a fall on the wet ground while washing his car. Symptoms of pain, limitation of motion and weakness are significantly impairing activities of daily living.  MR arthrogram of the right shoulder demonstrated a full-thickness and retracted tear of the right supraspinatus tendon.  He has agreed with surgical fixation of his rotator cuff tear.  A preop history and physical was performed in the preoperative area today.  He received an interscalene block by the anesthesia service with Exparel today in the preoperative area.  Past Medical History:  Diagnosis Date  . Diabetes type 2, controlled ([place]) 2012   controlled with lifestyle (diet, exercise) change  . H/O: pneumonia 06/11/1987  . HLD (hyperlipidemia)   . HTN (hypertension)    no meds   . Other specified visual disturbances    Past Surgical History:  Procedure Laterality Date  . COLONOSCOPY  09/2015   hyperplastic polpy rpt 10 yrs Henrene Pastor)  . VASECTOMY  1998   Social History   Socioeconomic History  . Marital status: Married    Spouse name: Not on file  . Number of children: 2  . Years of education: Not on file  . Highest education level: Not on file  Occupational History  . Occupation: Technical brewer: [employer]  Social Needs  . Financial resource strain: Not on file  . Food insecurity    Worry: Not on file    Inability: Not on file  . Transportation needs    Medical: Not on file    Non-medical: Not on file  Tobacco Use  . Smoking status: Former Smoker    Types: Cigars  . Smokeless tobacco: Never Used  . Tobacco comment: on occasion only-never regularly  Substance and Sexual Activity  . Alcohol use: Yes    Alcohol/week: 0.0  standard drinks    Comment: Rare  . Drug use: No  . Sexual activity: Not on file  Lifestyle  . Physical activity    Days per week: Not on file    Minutes per session: Not on file  . Stress: Not on file  Relationships  . Social Herbalist on phone: Not on file    Gets together: Not on file    Attends religious service: Not on file    Active member of club or organization: Not on file    Attends meetings of clubs or organizations: Not on file    Relationship status: Not on file  Other Topics Concern  . Not on file  Social History Narrative   Caffeine: 0   Lives with wife Investment banker, corporate), 2 kids, no pets   Quarry manager at [employer]   Activity: 1-2 hours every morning combination of weights and aerobic exercise   Diet: good water, fruits/vegetables daily   Family History  Problem Relation Age of Onset  . Healthy Father   . Hypertension Mother   . Cancer Mother        ovarian  . Colon polyps Mother   . Hyperlipidemia Mother   . Coronary artery disease Maternal Grandfather 75       MI  . Other Brother        Prediabetes  . Cancer Maternal Uncle  unknown form with mets  . Colon polyps Maternal Aunt    Allergies  Allergen Reactions  . Latex Shortness Of Breath and Itching  . Metformin     blurry vision   Prior to Admission medications   Medication Sig Start Date End Date Taking? Authorizing Provider  atorvastatin (LIPITOR) 40 MG tablet Take 1 tablet (40 mg total) by mouth daily. Patient taking differently: Take 40 mg by mouth every morning.  06/11/18  Yes Ria Bush, MD  meloxicam (MOBIC) 7.5 MG tablet Take 7.5 mg by mouth daily as needed for pain. 02/01/19  Yes [provider]  traMADol (ULTRAM) 50 MG tablet Take 50 mg by mouth every 6 (six) hours as needed for moderate pain.  02/10/19  Yes [provider]  vitamin C (ASCORBIC ACID) 500 MG tablet Take 500 mg by mouth daily.   Yes [provider]  sitaGLIPtin (JANUVIA) 50 MG tablet Take 1  tablet (50 mg total) by mouth daily. Patient not taking: Reported on 03/24/2019 12/01/18   Ria Bush, MD     Positive ROS: All other systems have been reviewed and were otherwise negative with the exception of those mentioned in the HPI and as above.  Physical Exam: General: Alert, no acute distress Cardiovascular: Regular rate and rhythm, no murmurs rubs or gallops.  No pedal edema Respiratory: Clear to auscultation bilaterally, no wheezes rales or rhonchi. No cyanosis, no use of accessory musculature GI: No organomegaly, abdomen is soft and non-tender nondistended with positive bowel sounds. Skin: Skin intact, no lesions within the operative field. Neurologic: Sensation intact distally Psychiatric: Patient is competent for consent with normal mood and affect Lymphatic: No cervical lymphadenopathy  MUSCULOSKELETAL: Right shoulder: Patient's skin is intact.  There is no erythema ecchymosis or deformity.  Patient has no swelling.  Patient can forward elevate and abduct to approximately 90 degrees with pain.  He demonstrates weakness to shoulder abduction and mild external rotator weakness.  He has 5 out of 5 strength of the supraspinatus.  Patient has intact sensation light touch throughout the right upper extremity.  He has palpable radial pulse.  He has full digital wrist and upper range of motion.  Patient had pain with impingement testing, but demonstrated no apprehension or instability.  He had mild tenderness over the [facility] joint.  Assessment: M75.121 complete rotator cuff tear or rupture of right shoulder not specified as traumatic  Plan: Plan for Procedure(s): RIGHT SHOULDER ARTHROSCOPY WITH MINI-OPEN ROTATOR CUFF REPAIR  I reviewed the details of the operation as well as the postoperative course with the patient.  I answered all the patient's questions.  I discussed the risks and benefits of surgery. The risks include but are not limited to infection, bleeding, nerve or blood  vessel injury, joint stiffness or loss of motion, persistent pain, weakness or instability, retear of the rotator cuff and hardware failure and the need for further surgery. Medical risks include but are not limited to DVT and pulmonary embolism, myocardial infarction, stroke, pneumonia, respiratory failure and death. Patient understood these risks and wished to proceed.    Thornton Park, MD   04/01/2019 7:54 AM

## 2019-04-03 ENCOUNTER — Encounter: Payer: Self-pay | Admitting: Family Medicine

## 2019-04-03 DIAGNOSIS — M75101 Unspecified rotator cuff tear or rupture of right shoulder, not specified as traumatic: Secondary | ICD-10-CM | POA: Insufficient documentation

## 2019-04-05 NOTE — Anesthesia Postprocedure Evaluation (Signed)
Anesthesia Post Note  Patient: JUD FABREGAS  Procedure(s) Performed: RIGHT SHOULDER ARTHROSCOPY, ARTHROSCOPIC LABRAL REPAIR, SUBACROMIAL DECOMPRESSION, DISTAL CLAVICLE EXCISION, MINI OPEN ROTATOR CUFF REPAIR (Right Shoulder)  Patient location during evaluation: PACU Anesthesia Type: General Level of consciousness: awake and alert Pain management: pain level controlled (Good block) Vital Signs Assessment: post-procedure vital signs reviewed and stable Respiratory status: spontaneous breathing, nonlabored ventilation and respiratory function stable Cardiovascular status: blood pressure returned to baseline and stable Postop Assessment: no apparent nausea or vomiting Anesthetic complications: no     Last Vitals:  Vitals:   04/01/19 1324 04/01/19 1350  BP: 112/66 124/70  Pulse: 67 68  Resp: 16 17  Temp: 36.8 C (!) 36.1 C  SpO2: 100%     Last Pain:  Vitals:   04/01/19 1350  TempSrc:   PainSc: 0-No pain                 Alphonsus Sias

## 2019-04-12 ENCOUNTER — Encounter: Payer: Self-pay | Admitting: Family Medicine

## 2019-06-28 ENCOUNTER — Encounter: Payer: Self-pay | Admitting: Family Medicine

## 2019-06-28 LAB — HM DIABETES EYE EXAM

## 2019-10-22 ENCOUNTER — Telehealth: Payer: Self-pay

## 2019-10-22 ENCOUNTER — Encounter: Payer: Self-pay | Admitting: Emergency Medicine

## 2019-10-22 ENCOUNTER — Other Ambulatory Visit: Payer: Self-pay

## 2019-10-22 ENCOUNTER — Ambulatory Visit
Admission: EM | Admit: 2019-10-22 | Discharge: 2019-10-22 | Disposition: A | Discharge status: Home / Self Care | Payer: No Typology Code available for payment source | Attending: Emergency Medicine | Admitting: Emergency Medicine

## 2019-10-22 DIAGNOSIS — E1165 Type 2 diabetes mellitus with hyperglycemia: Secondary | ICD-10-CM

## 2019-10-22 DIAGNOSIS — I10 Essential (primary) hypertension: Secondary | ICD-10-CM

## 2019-10-22 LAB — POCT FASTING CBG KUC MANUAL ENTRY: POCT Glucose (KUC): 246 mg/dL — AB (ref 70–99)

## 2019-10-22 NOTE — ED Triage Notes (Signed)
Pt c/o elevated blood pressure and blood sugar. He states he is not on any bp or sugar medications. His blood sugar was in the 250's this morning, fasting. His bp was 150's/90-100. He's noticed these changes over the last couple of days. Denies sob, or chest pain.

## 2019-10-22 NOTE — ED Provider Notes (Signed)
Gerald Collins    CSN: [id number] Arrival date & time: 10/22/19  1722      History   Chief Complaint Chief Complaint  Patient presents with  . Hypertension  . Hyperglycemia    HPI Gerald Collins is a 54 y.o. male.   Patient presents with concern for elevated blood pressure and blood sugar at home over the past couple of days.  He has history of hypertension and diabetes but is not currently on any medications.  He reports his fasting blood sugar was 250 this morning; blood pressure 140-150/90-100.  He denies weakness, dizziness, headache, chest pain, shortness of breath, edema, or other symptoms.  No treatments at home.    The history is provided by the patient.    Past Medical History:  Diagnosis Date  . Diabetes type 2, controlled ([place]) 2012   controlled with lifestyle (diet, exercise) change  . H/O: pneumonia 06/11/1987  . HLD (hyperlipidemia)   . HTN (hypertension)    no meds   . Other specified visual disturbances     Patient Active Problem List   Diagnosis Date Noted  . Tear of right supraspinatus tendon 04/03/2019  . Shoulder dislocation, right, initial encounter 02/09/2019  . Essential hypertension 04/27/2018  . External hemorrhoid 10/08/2017  . Hypogonadism in male 08/02/2015  . Obesity, Class I, BMI 30-34.9 07/24/2015  . Abdominal pain, epigastric 06/16/2015  . Health care maintenance 10/08/2013  . Tinea pedis of right foot 11/06/2010  . Dyslipidemia 08/07/2010  . Diet-controlled diabetes mellitus ([place]) 07/24/2010    Past Surgical History:  Procedure Laterality Date  . COLONOSCOPY  09/2015   hyperplastic polpy rpt 10 yrs Henrene Pastor)  . SHOULDER ARTHROSCOPY WITH OPEN ROTATOR CUFF REPAIR Right 04/01/2019   RIGHT SHOULDER ARTHROSCOPY, ARTHROSCOPIC LABRAL REPAIR, SUBACROMIAL DECOMPRESSION, DISTAL CLAVICLE EXCISION, MINI OPEN ROTATOR CUFF REPAIR;  Thornton Park, MD  . [place] Medications    Prior to Admission medications     Medication Sig Start Date End Date Taking? Authorizing Provider  atorvastatin (LIPITOR) 40 MG tablet Take 1 tablet (40 mg total) by mouth daily. Patient taking differently: Take 40 mg by mouth every morning.  06/11/18  Yes Ria Bush, MD  vitamin C (ASCORBIC ACID) 500 MG tablet Take 500 mg by mouth daily.   Yes [provider]  ondansetron (ZOFRAN) 4 MG tablet Take 1 tablet (4 mg total) by mouth every 8 (eight) hours as needed for nausea or vomiting. 04/01/19   Thornton Park, MD  oxyCODONE (OXY IR/ROXICODONE) 5 MG immediate release tablet Take 1 tablet (5 mg total) by mouth every 4 (four) hours as needed. 04/01/19   Thornton Park, MD  sitaGLIPtin (JANUVIA) 50 MG tablet Take 1 tablet (50 mg total) by mouth daily. Patient not taking: Reported on 03/24/2019 12/01/18   Ria Bush, MD    Family History Family History  Problem Relation Age of Onset  . Healthy Father   . Hypertension Mother   . Cancer Mother        ovarian  . Colon polyps Mother   . Hyperlipidemia Mother   . Coronary artery disease Maternal Grandfather 75       MI  . Other Brother        Prediabetes  . Cancer Maternal Uncle        unknown form with mets  . Colon polyps Maternal Aunt     Social History Social History   Tobacco Use  .  Smoking status: Former Smoker    Types: Cigars  . Smokeless tobacco: Never Used  . Tobacco comment: on occasion only-never regularly  Substance Use Topics  . Alcohol use: Yes    Alcohol/week: 0.0 standard drinks    Comment: Rare  . Drug use: No     Allergies   Latex and Metformin   Review of Systems Review of Systems  Constitutional: Negative for chills and fever.  HENT: Negative for ear pain and sore throat.   Eyes: Negative for pain and visual disturbance.  Respiratory: Negative for cough and shortness of breath.   Cardiovascular: Negative for chest pain and palpitations.  Gastrointestinal: Negative for abdominal pain and vomiting.   Genitourinary: Negative for dysuria and hematuria.  Musculoskeletal: Negative for arthralgias and back pain.  Skin: Negative for color change and rash.  Neurological: Negative for dizziness, seizures, syncope, facial asymmetry, speech difficulty, weakness, numbness and headaches.  All other systems reviewed and are negative.    Physical Exam Triage Vital Signs ED Triage Vitals  Enc Vitals Group     BP 10/22/19 1725 (!) 175/110     Pulse Rate 10/22/19 1725 91     Resp 10/22/19 1725 18     Temp 10/22/19 1725 98.7 F (37.1 C)     Temp Source 10/22/19 1725 Oral     SpO2 10/22/19 1725 98 %     Weight 10/22/19 1724 233 lb 11 oz (106 kg)     Height 10/22/19 1724 6\' 1"  (1.854 m)     Head Circumference --      Peak Flow --      Pain Score 10/22/19 1724 0     Pain Loc --      Pain Edu? --      Excl. in [place]? --    No data found.  Updated Vital Signs BP (!) 168/94 (BP Location: Left Arm)   Pulse 91   Temp 98.7 F (37.1 C) (Oral)   Resp 18   Ht 6\' 1"  (1.854 m)   Wt 233 lb 11 oz (106 kg)   SpO2 98%   BMI 30.83 kg/m   Visual Acuity Right Eye Distance:   Left Eye Distance:   Bilateral Distance:    Right Eye Near:   Left Eye Near:    Bilateral Near:     Physical Exam Vitals and nursing note reviewed.  Constitutional:      General: He is not in acute distress.    Appearance: He is well-developed. He is not ill-appearing.  HENT:     Head: Normocephalic and atraumatic.     Right Ear: Tympanic membrane normal.     Left Ear: Tympanic membrane normal.     Mouth/Throat:     Mouth: Mucous membranes are moist.     Pharynx: Oropharynx is clear.  Eyes:     Conjunctiva/sclera: Conjunctivae normal.  Cardiovascular:     Rate and Rhythm: Normal rate and regular rhythm.     Heart sounds: Normal heart sounds. No murmur.  Pulmonary:     Effort: Pulmonary effort is normal. No respiratory distress.     Breath sounds: Normal breath sounds.  Abdominal:     Palpations: Abdomen is  soft.     Tenderness: There is no abdominal tenderness. There is no guarding or rebound.  Musculoskeletal:     Cervical back: Neck supple.     Right lower leg: No edema.     Left lower leg: No edema.  Skin:  General: Skin is warm and dry.  Neurological:     General: No focal deficit present.     Mental Status: He is alert and oriented to person, place, and time.     Cranial Nerves: No cranial nerve deficit.     Sensory: No sensory deficit.     Motor: No weakness.     Coordination: Coordination normal.     Gait: Gait normal.  Psychiatric:        Mood and Affect: Mood normal.        Behavior: Behavior normal.      UC Treatments / Results  Labs (all labs ordered are listed, but only abnormal results are displayed) Labs Reviewed  POCT FASTING CBG [facility] MANUAL ENTRY - Abnormal; Notable for the following components:      Result Value   POCT Glucose ([facility]) 246 (*)    All other components within normal limits    EKG   Radiology No results found.  Procedures Procedures (including critical care time)  Medications Ordered in UC Medications - No data to display  Initial Impression / Assessment and Plan / UC Course  I have reviewed the triage vital signs and the nursing notes.  Pertinent labs & imaging results that were available during my care of the patient were reviewed by me and considered in my medical decision making (see chart for details).   Elevated blood pressure with known hypertension.  DM with hyperglycemia.  Patient has an appointment already scheduled with his PCP on Monday.  Instructed him to keep a log of his blood pressure and blood sugar readings over the weekend to take with him on Monday.  Instructed him to go to the emergency department if he has acute symptoms such as weakness, numbness, chest pain, shortness of breath, dizziness, or other concerning symptoms.  Diet instructions for HTN and DM provided.  Patient agrees to plan of care.      Final  Clinical Impressions(s) / UC Diagnoses   Final diagnoses:  Elevated blood pressure reading in office with diagnosis of hypertension  Type 2 diabetes mellitus with hyperglycemia, without long-term current use of insulin [facility])     Discharge Instructions     Your blood sugar was elevated today at 246.    Your blood pressure was elevated at 175/110 and 168/94.    Follow-up with your primary care provider as scheduled on Monday.  Keep a log of your blood pressure readings and your blood sugar readings over the weekend to take to your primary care provider on Monday.    Go to the emergency department if you have acute symptoms such as weakness, numbness, chest pain, shortness of breath, or other concerning symptoms.    Read the attached handouts on high blood pressure and diabetes.        ED Prescriptions    None     PDMP not reviewed this encounter.   Sharion Balloon, NP 10/22/19 1826

## 2019-10-22 NOTE — Discharge Instructions (Addendum)
Your blood sugar was elevated today at 246.    Your blood pressure was elevated at 175/110 and 168/94.    Follow-up with your primary care provider as scheduled on Monday.  Keep a log of your blood pressure readings and your blood sugar readings over the weekend to take to your primary care provider on Monday.    Go to the emergency department if you have acute symptoms such as weakness, numbness, chest pain, shortness of breath, or other concerning symptoms.    Read the attached handouts on high blood pressure and diabetes.

## 2019-10-22 NOTE — Telephone Encounter (Signed)
Aware, I will see him then °

## 2019-10-22 NOTE — Telephone Encounter (Signed)
I tried to reach pt on phone but was not able to reach pt to see if pt is at an UC or ED.  I did speak with pt and he has not decided if he is going anywhere today. No H/A,CP, dizziness or SOB. Pt just felt different; blah feeling and shoulders felt tense and has been urinating a lot. FBS 225. Pt is not on any diabetic or BP medication. Pt is checking BP now BP now is 147/116 in on  Rt arm and in other arm 158/95 P 83. Pt cannot ck BS because the strips have expired. Pt is waiting for his wife who is a nurse to come home. Pt said he might go to an UC in [place] when his wife gets home but pt said he waited this morning and kept getting disconnected to schedule appt at [facility]. Pt scheduled 30' appt on 10/25/19 with Dr Glori Bickers; pt will call and cancel if pt decides to go somewhere to be seen this weekend. UC & ED precautions given and pt voiced understanding. FYI to Dr Glori Bickers. Pt had annual exam on 11/30/18.

## 2019-10-22 NOTE — Telephone Encounter (Signed)
[facility] - Client TELEPHONE ADVICE RECORD [facility] Patient Name: Gerald Collins Gender: Male DOB: 10/20/1965 Age: 54 Y 85 M 1 D Return Phone Number: [phone] (Primary) Address: City/State/Zip: [place] [address] Client [facility] - Client Client Site [place] Physician Ria Bush - MD Contact Type Call Who Is Calling Patient / Member / Family / Caregiver Call Type Triage / Clinical Relationship To Patient Self Return Phone Number [phone] (Primary) Chief Complaint Blood Sugar High Reason for Call Symptomatic / Request for [place] states his blood pressure is high: 141/96, 171/101, BS: 225, and wondering what to do. Using restroom alot, today: tapering off alot. BP now: 150-98. Translation No Nurse Assessment Nurse: Carlena Bjornstad, RN, Arsenio Katz Date/Time Eilene Ghazi Time): 10/22/2019 2:09:20 PM Confirm and document reason for call. If symptomatic, describe symptoms. ---Caller states his blood pressure is high: 141/96, 171/101(yesterday), BS: 225, and wondering what to do. Using restroom more often. Has the patient had close contact with a person known or suspected to have the novel coronavirus illness OR traveled / lives in area with major community spread (including international travel) in the last 14 days from the onset of symptoms? * If Asymptomatic, screen for exposure and travel within the last 14 days. ---No Does the patient have any new or worsening symptoms? ---Yes Will a triage be completed? ---Yes Related visit to physician within the last 2 weeks? ---No Does the PT have any chronic conditions? (i.e. diabetes, asthma, this includes High risk factors for pregnancy, etc.) ---Yes List chronic conditions. ---diabetes, HTN, Is this a behavioral health or substance abuse call? ---No Guidelines Guideline Title Affirmed Question Affirmed  Notes Nurse Date/Time (Eastern Time) Diabetes - High Blood Sugar [1] Symptoms of high blood sugar (e.g., frequent urination, weak, weight loss) AND [2] not able to test blood glucose Carlena Bjornstad, RN, Verdis Frederickson Angelica [id number] PM PLEASE NOTE: All timestamps contained within this report are represented as [place] Standard Time. CONFIDENTIALTY NOTICE: This fax transmission is intended only for the addressee. It contains information that is legally privileged, confidential or otherwise protected from use or disclosure. If you are not the intended recipient, you are strictly prohibited from reviewing, disclosing, copying using or disseminating any of this information or taking any action in reliance on or regarding this information. If you have received this fax in error, please notify us immediately by telephone so that we can arrange for its return to [place]. Phone: [phone], Toll-Free: [phone], Fax: [phone] Page: 2 of 2 Call Id: [id number] Guidelines Guideline Title Affirmed Question Affirmed Notes Nurse Date/Time Eilene Ghazi Time) Blood Pressure - High [id number] Systolic BP >= [id number] OR Diastolic >= 80 AND [id number] not taking BP medications Carlena Bjornstad, RN, Verdis Frederickson Angelica [id number] PM Disp. Time Eilene Ghazi Time) Disposition Final User 10/22/2019 2:14:53 PM See PCP within 24 Hours Carlena Bjornstad, RN, Verdis Frederickson Angelica [id number] PM See PCP within 2 Weeks Yes Carlena Bjornstad, RN, Arsenio Katz Caller Disagree/Comply Comply Caller Understands Yes PreDisposition Call Doctor Care Advice Given Per Guideline SEE PCP WITHIN 24 HOURS: * IF OFFICE WILL BE OPEN: You need to be seen within the next 24 hours. Call your doctor (or NP/PA) when the office opens and make an appointment. TREATMENT - LIQUIDS: * Drink at least one glass (8 oz; 240 ml) of water per hour for the next 4 hours (Reason: adequate hydration will help lower blood sugar). CALL BACK IF: * Vomiting occurs * Rapid breathing  occurs * You become worse. CARE  ADVICE given per Diabetes - High Blood Sugar (Adult) guideline. * IF OFFICE WILL BE CLOSED AND NO PCP (PRIMARY CARE PROVIDER) SECOND-LEVEL TRIAGE: You need to be seen within the next 24 hours. A clinic or an urgent care center is often a good source of care if your doctor's office is closed or you can't get an appointment. SEE PCP WITHIN 2 WEEKS: * You need to be seen for this ongoing problem within the next 2 weeks. Call your doctor (or NP/ PA) during regular office hours and make an appointment. CALL BACK IF: * Weakness or numbness of the face, arm or leg on one side of the body occurs * Difficulty walking, difficulty talking, or severe headache occurs * Chest pain or difficulty breathing occurs * Your blood pressure is over 160/100 * You become worse. CARE ADVICE given per High Blood Pressure (Adult) guideline. Comments User: Jonny Ruiz, RN Date/Time Eilene Ghazi Time): 10/22/2019 2:17:31 PM BP now is 150/98. Referrals REFERRED

## 2019-10-25 ENCOUNTER — Other Ambulatory Visit: Payer: Self-pay

## 2019-10-25 ENCOUNTER — Encounter: Payer: Self-pay | Admitting: Family Medicine

## 2019-10-25 ENCOUNTER — Ambulatory Visit (INDEPENDENT_AMBULATORY_CARE_PROVIDER_SITE_OTHER): Payer: No Typology Code available for payment source | Admitting: Family Medicine

## 2019-10-25 VITALS — BP 142/94 | HR 72 | Temp 97.5°F | Ht 72.0 in | Wt 223.5 lb

## 2019-10-25 DIAGNOSIS — E1169 Type 2 diabetes mellitus with other specified complication: Secondary | ICD-10-CM | POA: Diagnosis not present

## 2019-10-25 DIAGNOSIS — E669 Obesity, unspecified: Secondary | ICD-10-CM

## 2019-10-25 DIAGNOSIS — I10 Essential (primary) hypertension: Secondary | ICD-10-CM

## 2019-10-25 DIAGNOSIS — E119 Type 2 diabetes mellitus without complications: Secondary | ICD-10-CM | POA: Diagnosis not present

## 2019-10-25 DIAGNOSIS — E785 Hyperlipidemia, unspecified: Secondary | ICD-10-CM

## 2019-10-25 MED ORDER — SITAGLIPTIN PHOSPHATE 50 MG PO TABS: 50.0000 mg | ORAL_TABLET | Freq: Every day | ORAL | 3 refills | Status: DC

## 2019-10-25 MED ORDER — AMLODIPINE BESYLATE 5 MG PO TABS: 5.0000 mg | ORAL_TABLET | Freq: Every day | ORAL | 3 refills | Status: DC

## 2019-10-25 NOTE — Patient Instructions (Addendum)
Keep checking your glucose in am and 2 hours after a meal Start the [place] one pill daily for diabetes (take in am with breakfast)    Amlodipine 5 mg -start one pill daily for blood pressure   If any side effects or problems with any of the medicine -call and let us know   Labs today   Follow up with Dr Darnell Level in about 2 weeks

## 2019-10-25 NOTE — Assessment & Plan Note (Signed)
bp is not at goal -was higher however at UC BP Readings from Last 1 Encounters:  10/25/19 (!) 142/94   Px amlodipine 5 mg daily -will call if side eff  Avoiding ace/arb given h/o ? 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

## 2019-10-25 NOTE — Assessment & Plan Note (Addendum)
Needs to get back on track after lack of f/u and noncompliance A1C done today Glucose runs in mid 200s at home  Has not taken [place] (denies knowing it was px) Px this 100 mg daily - inst to update if side eff Check glucose bid and f/u with pcp in about 2 weeks  On a statin  Not on ace (h/o anaphylaxis to another thing in the past)  Has had DM ed- states he will get back to eating well and exercise (now that shoulder is healed)

## 2019-10-25 NOTE — Assessment & Plan Note (Signed)
Discussed how this problem influences overall health and the risks it imposes  Reviewed plan for weight loss with lower calorie diet (via better food choices and also portion control or program like weight watchers) and exercise building up to or more than 30 minutes 5 days per week including some aerobic activity    

## 2019-10-25 NOTE — Assessment & Plan Note (Signed)
Taking atorvastatin daily  Disc goals for lipids and reasons to control them Rev last labs with pt Rev low sat fat diet in detail Disc diet  Lipid panel drawn today

## 2019-10-25 NOTE — Progress Notes (Signed)
Subjective:    Patient ID: Gerald Collins, male    DOB: 1965/08/09, 54 y.o.   MRN: [id number] This visit occurred during the SARS-CoV-2 public health emergency.  Safety protocols were in place, including screening questions prior to the visit, additional usage of staff PPE, and extensive cleaning of exam room while observing appropriate contact time as indicated for disinfecting solutions.     HPI 54 yo pt of Dr Darnell Level presents with elevated blood sugar and blood pressure   Wt Readings from Last 3 Encounters:  10/25/19 223 lb 8 oz (101.4 kg)  10/22/19 233 lb 11 oz (106 kg)  04/01/19 233 lb 11 oz (106 kg)  wt is down 10 lb  30.31 kg/m   Had shoulder surgery recently -was less active  (sitting most of the time from august to now)  Making a good recovery   Diet got off track as well   He was seen in UC on 5/14 Told his bp was 175/110 and glucose was 246   HTN bp is stable today  No cp or palpitations or headaches or edema  No side effects to medicines  BP Readings from Last 3 Encounters:  10/25/19 (!) 142/94  10/22/19 (!) 168/94  04/01/19 124/70     No medications for bp currently    Pulse Readings from Last 3 Encounters:  10/25/19 72  10/22/19 91  04/01/19 68     DM2 Never took the [place] that was ordered  Intol to metformin  Lab Results  Component Value Date   HGBA1C 8.7 (H) 11/30/2018  was lost to f/u after that   At home glucose in 240s   (2h pp)  This am fasting 195  Patient Active Problem List   Diagnosis Date Noted  . Tear of right supraspinatus tendon 04/03/2019  . Shoulder dislocation, right, initial encounter 02/09/2019  . Essential hypertension 04/27/2018  . External hemorrhoid 10/08/2017  . Hypogonadism in male 08/02/2015  . Obesity, Class I, BMI 30-34.9 07/24/2015  . Abdominal pain, epigastric 06/16/2015  . Health care maintenance 10/08/2013  . Tinea pedis of right foot 11/06/2010  . Hyperlipidemia associated with type 2 diabetes mellitus  ([place]) 08/07/2010  . Diet-controlled diabetes mellitus ([place]) 07/24/2010   Past Medical History:  Diagnosis Date  . Diabetes type 2, controlled ([place]) 2012   controlled with lifestyle (diet, exercise) change  . H/O: pneumonia 06/11/1987  . HLD (hyperlipidemia)   . HTN (hypertension)    no meds   . Other specified visual disturbances    Past Surgical History:  Procedure Laterality Date  . COLONOSCOPY  09/2015   hyperplastic polpy rpt 10 yrs Henrene Pastor)  . SHOULDER ARTHROSCOPY WITH OPEN ROTATOR CUFF REPAIR Right 04/01/2019   RIGHT SHOULDER ARTHROSCOPY, ARTHROSCOPIC LABRAL REPAIR, SUBACROMIAL DECOMPRESSION, DISTAL CLAVICLE EXCISION, MINI OPEN ROTATOR CUFF REPAIR;  Thornton Park, MD  . VASECTOMY  1998   Social History   Tobacco Use  . Smoking status: Former Smoker    Types: Cigars  . Smokeless tobacco: Never Used  . Tobacco comment: on occasion only-never regularly  Substance Use Topics  . Alcohol use: Yes    Alcohol/week: 0.0 standard drinks    Comment: Rare  . Drug use: No   Family History  Problem Relation Age of Onset  . Healthy Father   . Hypertension Mother   . Cancer Mother        ovarian  . Colon polyps Mother   . Hyperlipidemia Mother   . Coronary artery  disease Maternal Grandfather 29       MI  . Other Brother        Prediabetes  . Cancer Maternal Uncle        unknown form with mets  . Colon polyps Maternal Aunt    Allergies  Allergen Reactions  . Latex Shortness Of Breath and Itching  . Metformin     blurry vision   Current Outpatient Medications on File Prior to Visit  Medication Sig Dispense Refill  . atorvastatin (LIPITOR) 40 MG tablet Take 1 tablet (40 mg total) by mouth daily. (Patient taking differently: Take 40 mg by mouth every morning. ) 90 tablet 3  . vitamin C (ASCORBIC ACID) 500 MG tablet Take 500 mg by mouth daily.     No current facility-administered medications on file prior to visit.     Review of Systems  Constitutional: Negative for  activity change, appetite change, fatigue, fever and unexpected weight change.  HENT: Negative for congestion, rhinorrhea, sore throat and trouble swallowing.   Eyes: Negative for pain, redness, itching and visual disturbance.  Respiratory: Negative for cough, chest tightness, shortness of breath and wheezing.   Cardiovascular: Negative for chest pain and palpitations.  Gastrointestinal: Negative for abdominal pain, blood in stool, constipation, diarrhea and nausea.  Endocrine: Positive for polydipsia. Negative for cold intolerance, heat intolerance and polyuria.  Genitourinary: Negative for difficulty urinating, dysuria, frequency and urgency.  Musculoskeletal: Negative for arthralgias, joint swelling and myalgias.  Skin: Negative for pallor and rash.  Neurological: Negative for dizziness, tremors, weakness, light-headedness, numbness and headaches.       Feels odd when bp is up-cannot describe that feeling   Hematological: Negative for adenopathy. Does not bruise/bleed easily.  Psychiatric/Behavioral: Negative for decreased concentration and dysphoric mood. The patient is not nervous/anxious.        Objective:   Physical Exam Constitutional:      General: He is not in acute distress.    Appearance: He is well-developed. He is obese. He is not ill-appearing or diaphoretic.  HENT:     Head: Normocephalic and atraumatic.  Eyes:     General: No scleral icterus.    Conjunctiva/sclera: Conjunctivae normal.     Pupils: Pupils are equal, round, and reactive to light.  Neck:     Thyroid: No thyromegaly.     Vascular: No carotid bruit or JVD.  Cardiovascular:     Rate and Rhythm: Normal rate and regular rhythm.     Heart sounds: Normal heart sounds. No gallop.   Pulmonary:     Effort: Pulmonary effort is normal. No respiratory distress.     Breath sounds: Normal breath sounds. No wheezing or rales.  Abdominal:     General: Bowel sounds are normal. There is no distension or abdominal  bruit.     Palpations: Abdomen is soft. There is no mass.     Tenderness: There is no abdominal tenderness.  Musculoskeletal:        General: No tenderness.     Cervical back: Normal range of motion and neck supple.     Right lower leg: No edema.     Left lower leg: No edema.  Lymphadenopathy:     Cervical: No cervical adenopathy.  Skin:    General: Skin is warm and dry.     Coloration: Skin is not pale.     Findings: No rash.  Neurological:     Mental Status: He is alert.     Sensory: No sensory  deficit.     Coordination: Coordination normal.     Deep Tendon Reflexes: Reflexes are normal and symmetric. Reflexes normal.  Psychiatric:        Mood and Affect: Mood normal.           Assessment & Plan:   Problem List Items Addressed This Visit      Cardiovascular and Mediastinum   Essential hypertension    bp is not at goal -was higher however at UC BP Readings from Last 1 Encounters:  10/25/19 (!) 142/94   Px amlodipine 5 mg daily -will call if side eff  Avoiding ace/arb given h/o ? Anaphylaxis to latex in the past Most recent labs reviewed  Disc lifstyle change with low sodium diet and exercise        Relevant Medications   amLODipine (NORVASC) 5 MG tablet   Other Relevant Orders   Lipid panel   CBC with Differential/Platelet   Comprehensive metabolic panel   TSH   Hemoglobin A1c     Endocrine   Diet-controlled diabetes mellitus ([place]) - Primary    Needs to get back on track after lack of f/u and noncompliance A1C done today Glucose runs in mid 200s at home  Has not taken [place] (denies knowing it was px) Px this 100 mg daily - inst to update if side eff Check glucose bid and f/u with pcp in about 2 weeks  On a statin  Not on ace (h/o anaphylaxis to another thing in the past)  Has had DM ed- states he will get back to eating well and exercise (now that shoulder is healed)        Relevant Medications   sitaGLIPtin (JANUVIA) 50 MG tablet   Other  Relevant Orders   Lipid panel   CBC with Differential/Platelet   Comprehensive metabolic panel   TSH   Hemoglobin A1c   Hyperlipidemia associated with type 2 diabetes mellitus ([place])    Taking atorvastatin daily  Disc goals for lipids and reasons to control them Rev last labs with pt Rev low sat fat diet in detail Disc diet  Lipid panel drawn today      Relevant Medications   sitaGLIPtin (JANUVIA) 50 MG tablet     Other   Obesity, Class I, BMI 30-34.9    Discussed how this problem influences overall health and the risks it imposes  Reviewed plan for weight loss with lower calorie diet (via better food choices and also portion control or program like weight watchers) and exercise building up to or more than 30 minutes 5 days per week including some aerobic activity

## 2019-10-26 LAB — LIPID PANEL
Chol/HDL Ratio: 4.1 ratio (ref 0.0–5.0)
Cholesterol, Total: 186 mg/dL (ref 100–199)
HDL: 45 mg/dL (ref >39)
LDL Chol Calc (NIH): 111 mg/dL — ABNORMAL HIGH (ref 0–99)
Triglycerides: 168 mg/dL — ABNORMAL HIGH (ref 0–149)
VLDL Cholesterol Cal: 30 mg/dL (ref 5–40)

## 2019-10-26 LAB — COMPREHENSIVE METABOLIC PANEL
ALT: 32 IU/L (ref 0–44)
AST: 26 IU/L (ref 0–40)
Albumin/Globulin Ratio: 2 (ref 1.2–2.2)
Albumin: 4.9 g/dL (ref 3.8–4.9)
Alkaline Phosphatase: 90 IU/L (ref 48–121)
BUN/Creatinine Ratio: 10 (ref 9–20)
BUN: 13 mg/dL (ref 6–24)
Bilirubin Total: 0.7 mg/dL (ref 0.0–1.2)
CO2: 21 mmol/L (ref 20–29)
Calcium: 9.9 mg/dL (ref 8.7–10.2)
Chloride: 99 mmol/L (ref 96–106)
Creatinine, Ser: 1.29 mg/dL — ABNORMAL HIGH (ref 0.76–1.27)
GFR calc Af Amer: 72 mL/min/{1.73_m2} (ref >59)
GFR calc non Af Amer: 62 mL/min/{1.73_m2} (ref >59)
Globulin, Total: 2.5 g/dL (ref 1.5–4.5)
Glucose: 181 mg/dL — ABNORMAL HIGH (ref 65–99)
Potassium: 4.1 mmol/L (ref 3.5–5.2)
Sodium: 137 mmol/L (ref 134–144)
Total Protein: 7.4 g/dL (ref 6.0–8.5)

## 2019-10-26 LAB — CBC WITH DIFFERENTIAL/PLATELET
Basophils Absolute: 0 10*3/uL (ref 0.0–0.2)
Basos: 1 %
EOS (ABSOLUTE): 0 10*3/uL (ref 0.0–0.4)
Eos: 0 %
Hematocrit: 44.2 % (ref 37.5–51.0)
Hemoglobin: 14.9 g/dL (ref 13.0–17.7)
Immature Grans (Abs): 0 10*3/uL (ref 0.0–0.1)
Immature Granulocytes: 0 %
Lymphocytes Absolute: 1.5 10*3/uL (ref 0.7–3.1)
Lymphs: 47 %
MCH: 27.6 pg (ref 26.6–33.0)
MCHC: 33.7 g/dL (ref 31.5–35.7)
MCV: 82 fL (ref 79–97)
Monocytes Absolute: 0.3 10*3/uL (ref 0.1–0.9)
Monocytes: 9 %
Neutrophils Absolute: 1.3 10*3/uL — ABNORMAL LOW (ref 1.4–7.0)
Neutrophils: 43 %
Platelets: 183 10*3/uL (ref 150–450)
RBC: 5.39 x10E6/uL (ref 4.14–5.80)
RDW: 11.9 % (ref 11.6–15.4)
WBC: 3.1 10*3/uL — ABNORMAL LOW (ref 3.4–10.8)

## 2019-10-26 LAB — HEMOGLOBIN A1C
Est. average glucose Bld gHb Est-mCnc: 255 mg/dL
Hgb A1c MFr Bld: 10.5 % — ABNORMAL HIGH (ref 4.8–5.6)

## 2019-10-26 LAB — TSH: TSH: 1.15 u[IU]/mL (ref 0.450–4.500)

## 2019-11-10 ENCOUNTER — Other Ambulatory Visit: Payer: Self-pay

## 2019-11-10 ENCOUNTER — Other Ambulatory Visit: Payer: Self-pay | Admitting: Family Medicine

## 2019-11-10 ENCOUNTER — Encounter: Payer: Self-pay | Admitting: Family Medicine

## 2019-11-10 ENCOUNTER — Ambulatory Visit (INDEPENDENT_AMBULATORY_CARE_PROVIDER_SITE_OTHER): Payer: No Typology Code available for payment source | Admitting: Family Medicine

## 2019-11-10 VITALS — BP 124/82 | HR 68 | Temp 98.1°F | Ht 72.0 in | Wt 224.0 lb

## 2019-11-10 DIAGNOSIS — S43004A Unspecified dislocation of right shoulder joint, initial encounter: Secondary | ICD-10-CM

## 2019-11-10 DIAGNOSIS — I10 Essential (primary) hypertension: Secondary | ICD-10-CM

## 2019-11-10 DIAGNOSIS — E118 Type 2 diabetes mellitus with unspecified complications: Secondary | ICD-10-CM | POA: Diagnosis not present

## 2019-11-10 DIAGNOSIS — E1169 Type 2 diabetes mellitus with other specified complication: Secondary | ICD-10-CM

## 2019-11-10 DIAGNOSIS — IMO0002 Reserved for concepts with insufficient information to code with codable children: Secondary | ICD-10-CM

## 2019-11-10 DIAGNOSIS — E785 Hyperlipidemia, unspecified: Secondary | ICD-10-CM

## 2019-11-10 DIAGNOSIS — B353 Tinea pedis: Secondary | ICD-10-CM | POA: Diagnosis not present

## 2019-11-10 DIAGNOSIS — E1165 Type 2 diabetes mellitus with hyperglycemia: Secondary | ICD-10-CM

## 2019-11-10 MED ORDER — AMLODIPINE BESYLATE 5 MG PO TABS: 5.0000 mg | ORAL_TABLET | Freq: Every day | ORAL | 3 refills | Status: DC

## 2019-11-10 MED ORDER — ATORVASTATIN CALCIUM 40 MG PO TABS: 40.0000 mg | ORAL_TABLET | Freq: Every day | ORAL | 3 refills | Status: DC

## 2019-11-10 MED ORDER — SITAGLIPTIN PHOSPHATE 50 MG PO TABS: 50.0000 mg | ORAL_TABLET | Freq: Every day | ORAL | 3 refills | Status: DC

## 2019-11-10 NOTE — Progress Notes (Signed)
This visit was conducted in person.  BP 124/82 (BP Location: Left Arm, Patient Position: Sitting, Cuff Size: Large)   Pulse 68   Temp 98.1 F (36.7 C) (Temporal)   Ht 6' (1.829 m)   Wt 224 lb (101.6 kg)   SpO2 98%   BMI 30.38 kg/m    CC: DM f/u visit  Subjective:    Patient ID: Gerald Collins, male    DOB: 11/13/1965, 54 y.o.   MRN: [id number]  HPI: Gerald Collins is a 54 y.o. male presenting on 11/10/2019 for Diabetes (Here for 2 wk f/u, per Dr. Glori Bickers. )   Received first [place] vaccine 09/2019  I last saw patient 11/2018. Saw Dr Glori Bickers last month, note reviewed - presented with elevated BP to 175/110 and cbg to 246 at urgent care.   Has struggled with recurrent shoulder dislocation s/p surgery 03/2019. More sedentary since then.   HTN - started on amlodipine 5mg  daily last month. Home readings 130s/90s. Avoiding ACEI/ARB due to anaphylaxis to latex. No HA, vision changes, CP/tightness, SOB, leg swelling.   DM - does regularly check sugars 90-130s. Compliant with antihyperglycemic regimen which includes: [place] 50mg  daily (started 2 wks ago). Intolerant to metformin. Denies low sugars or hypoglycemic symptoms. Denies paresthesias. Last diabetic eye exam seen this year 06/2019. Pneumovax: DUE. Prevnar: not due. Glucometer brand: [facility] brand. DSME: has previously completed diabetes classes. Lab Results  Component Value Date   HGBA1C 10.5 (H) 10/25/2019   Diabetic Foot Exam - Simple   Simple Foot Form Diabetic Foot exam was performed with the following findings: Yes 11/10/2019  9:53 AM  Visual Inspection See comments: Yes Sensation Testing Intact to touch and monofilament testing bilaterally: Yes Pulse Check Posterior Tibialis and Dorsalis pulse intact bilaterally: Yes Comments Marked maceration between toes, but actually improved from prior.    Lab Results  Component Value Date   MICROALBUR 1.3 10/01/2013         Relevant past medical, surgical, family and social history  reviewed and updated as indicated. Interim medical history since our last visit reviewed. Allergies and medications reviewed and updated. Outpatient Medications Prior to Visit  Medication Sig Dispense Refill  . vitamin C (ASCORBIC ACID) 500 MG tablet Take 500 mg by mouth daily.    Marland Kitchen amLODipine (NORVASC) 5 MG tablet Take 1 tablet (5 mg total) by mouth daily. 30 tablet 3  . atorvastatin (LIPITOR) 40 MG tablet Take 1 tablet (40 mg total) by mouth daily. (Patient taking differently: Take 40 mg by mouth every morning. ) 90 tablet 3  . sitaGLIPtin (JANUVIA) 50 MG tablet Take 1 tablet (50 mg total) by mouth daily. 30 tablet 3   No facility-administered medications prior to visit.     Per HPI unless specifically indicated in ROS section below Review of Systems Objective:  BP 124/82 (BP Location: Left Arm, Patient Position: Sitting, Cuff Size: Large)   Pulse 68   Temp 98.1 F (36.7 C) (Temporal)   Ht 6' (1.829 m)   Wt 224 lb (101.6 kg)   SpO2 98%   BMI 30.38 kg/m   Wt Readings from Last 3 Encounters:  11/10/19 224 lb (101.6 kg)  10/25/19 223 lb 8 oz (101.4 kg)  10/22/19 233 lb 11 oz (106 kg)      Physical Exam Vitals and nursing note reviewed.  Constitutional:      General: He is not in acute distress.    Appearance: He is well-developed.  HENT:  Head: Normocephalic and atraumatic.  Eyes:     Extraocular Movements: Extraocular movements intact.     Pupils: Pupils are equal, round, and reactive to light.  Cardiovascular:     Rate and Rhythm: Normal rate and regular rhythm.     Pulses: Normal pulses.     Heart sounds: Normal heart sounds. No murmur.  Pulmonary:     Effort: Pulmonary effort is normal. No respiratory distress.     Breath sounds: Normal breath sounds. No wheezing, rhonchi or rales.  Musculoskeletal:     Cervical back: Normal range of motion and neck supple.     Right lower leg: No edema.     Left lower leg: No edema.     Comments: See HPI for foot exam if done   Skin:    General: Skin is warm and dry.     Findings: No rash.  Psychiatric:        Mood and Affect: Mood normal.        Behavior: Behavior normal.       Results for orders placed or performed in visit on 10/25/19  Lipid panel  Result Value Ref Range   Cholesterol, Total 186 100 - 199 mg/dL   Triglycerides 168 (H) 0 - 149 mg/dL   HDL 45 >39 mg/dL   VLDL Cholesterol Cal 30 5 - 40 mg/dL   LDL Chol Calc (NIH) 111 (H) 0 - 99 mg/dL   Chol/HDL Ratio 4.1 0.0 - 5.0 ratio  CBC with Differential/Platelet  Result Value Ref Range   WBC 3.1 (L) 3.4 - 10.8 x10E3/uL   RBC 5.39 4.14 - 5.80 x10E6/uL   Hemoglobin 14.9 13.0 - 17.7 g/dL   Hematocrit 44.2 37.5 - 51.0 %   MCV 82 79 - 97 fL   MCH 27.6 26.6 - 33.0 pg   MCHC 33.7 31.5 - 35.7 g/dL   RDW 11.9 11.6 - 15.4 %   Platelets 183 150 - 450 x10E3/uL   Neutrophils 43 Not Estab. %   Lymphs 47 Not Estab. %   Monocytes 9 Not Estab. %   Eos 0 Not Estab. %   Basos 1 Not Estab. %   Neutrophils Absolute 1.3 (L) 1.4 - 7.0 x10E3/uL   Lymphocytes Absolute 1.5 0.7 - 3.1 x10E3/uL   Monocytes Absolute 0.3 0.1 - 0.9 x10E3/uL   EOS (ABSOLUTE) 0.0 0.0 - 0.4 x10E3/uL   Basophils Absolute 0.0 0.0 - 0.2 x10E3/uL   Immature Granulocytes 0 Not Estab. %   Immature Grans (Abs) 0.0 0.0 - 0.1 x10E3/uL  Comprehensive metabolic panel  Result Value Ref Range   Glucose 181 (H) 65 - 99 mg/dL   BUN 13 6 - 24 mg/dL   Creatinine, Ser 1.29 (H) 0.76 - 1.27 mg/dL   GFR calc non Af Amer 62 >59 mL/min/1.73   GFR calc Af Amer 72 >59 mL/min/1.73   BUN/Creatinine Ratio 10 9 - 20   Sodium 137 134 - 144 mmol/L   Potassium 4.1 3.5 - 5.2 mmol/L   Chloride 99 96 - 106 mmol/L   CO2 21 20 - 29 mmol/L   Calcium 9.9 8.7 - 10.2 mg/dL   Total Protein 7.4 6.0 - 8.5 g/dL   Albumin 4.9 3.8 - 4.9 g/dL   Globulin, Total 2.5 1.5 - 4.5 g/dL   Albumin/Globulin Ratio 2.0 1.2 - 2.2   Bilirubin Total 0.7 0.0 - 1.2 mg/dL   Alkaline Phosphatase 90 48 - 121 IU/L   AST 26 0 - 40 IU/L  ALT  32 0 - 44 IU/L  TSH  Result Value Ref Range   TSH 1.150 0.450 - 4.500 uIU/mL  Hemoglobin A1c  Result Value Ref Range   Hgb A1c MFr Bld 10.5 (H) 4.8 - 5.6 %   Est. average glucose Bld gHb Est-mCnc 255 mg/dL   Assessment & Plan:  This visit occurred during the SARS-CoV-2 public health emergency.  Safety protocols were in place, including screening questions prior to the visit, additional usage of staff PPE, and extensive cleaning of exam room while observing appropriate contact time as indicated for disinfecting solutions.   Problem List Items Addressed This Visit    Uncontrolled diabetes mellitus with complications ([place]) - Primary    Deteriorated control however marked improvement over the past 2 weeks with fasting cbg readings consistently 90-130s. Continue [place]. Anticipate good control. He is motivated to follow diabetic diet.       Relevant Medications   atorvastatin (LIPITOR) 40 MG tablet   sitaGLIPtin (JANUVIA) 50 MG tablet   Tinea pedis    R>L. S/p topical lotrimin use for 6 months. Will transition to terbinafine. If ineffective, will Rx oral terbinafine.       Shoulder dislocation, right, initial encounter    S/p shoulder surgery 03/2019 then underwent prolonged rehab - feels he's recovered well from this.       Hyperlipidemia associated with type 2 diabetes mellitus ([facility])    LDL above goal. Continue atorvastatin 40mg  daily. Reassess control at CPE The 10-year ASCVD risk score Mikey Bussing DC Brooke Bonito., et al., 2013) is: 18.2%   Values used to calculate the score:     Age: 42 years     Sex: Male     Is Non-Hispanic African American: Yes     Diabetic: Yes     Tobacco smoker: No     Systolic Blood Pressure: A999333 mmHg     Is BP treated: Yes     HDL Cholesterol: 45 mg/dL     Total Cholesterol: 186 mg/dL       Relevant Medications   atorvastatin (LIPITOR) 40 MG tablet   sitaGLIPtin (JANUVIA) 50 MG tablet   Essential hypertension    Chronic, stable on amlodipine - continue.         Relevant Medications   atorvastatin (LIPITOR) 40 MG tablet   amLODipine (NORVASC) 5 MG tablet       Meds ordered this encounter  Medications  . atorvastatin (LIPITOR) 40 MG tablet    Sig: Take 1 tablet (40 mg total) by mouth daily.    Dispense:  90 tablet    Refill:  3  . amLODipine (NORVASC) 5 MG tablet    Sig: Take 1 tablet (5 mg total) by mouth daily.    Dispense:  90 tablet    Refill:  3  . sitaGLIPtin (JANUVIA) 50 MG tablet    Sig: Take 1 tablet (50 mg total) by mouth daily.    Dispense:  90 tablet    Refill:  3   No orders of the defined types were placed in this encounter.  Patient Instructions  We will request records from [place] are actually looking much better! Continue januvia 100mg  daily. Let me know if sugars start running too high or too low.  Return in 4 months for physical.  Pick up lamisil (terbinafine) topical over the counter antifungal to take daily in place of clotrimazole. If not improving in 2-4 weeks let me know for oral antifungal medicine (prescription).  The 15-15 rule for low sugars: If sugar reading below 70, have 15 grams of carbohydrate to raise your blood sugar and check it after 15 minutes. If it's still below 70 mg/dL, have another serving. 15 grams of carbs may be: -Glucose tablets (see instructions) -Gel tube (see instructions) -4 ounces (1/2 cup) of juice or regular soda (not diet) -1 tablespoon of sugar, honey, or corn syrup -Hard candies, jellybeans or gumdrops--see food label for how many to consume  Repeat these steps until your blood sugar is at least 70 mg/dL. Once your blood sugar is back to normal, eat a meal or snack to make sure it doesn't lower again.    Follow up plan: Return in about 4 months (around 03/11/2020) for annual exam, prior fasting for blood work.  Ria Bush, MD

## 2019-11-10 NOTE — Assessment & Plan Note (Addendum)
LDL above goal. Continue atorvastatin 40mg  daily. Reassess control at CPE The 10-year ASCVD risk score Mikey Bussing DC Brooke Bonito., et al., 2013) is: 18.2%   Values used to calculate the score:     Age: 54 years     Sex: Male     Is Non-Hispanic African American: Yes     Diabetic: Yes     Tobacco smoker: No     Systolic Blood Pressure: A999333 mmHg     Is BP treated: Yes     HDL Cholesterol: 45 mg/dL     Total Cholesterol: 186 mg/dL

## 2019-11-10 NOTE — Assessment & Plan Note (Addendum)
S/p shoulder surgery 03/2019 then underwent prolonged rehab - feels he's recovered well from this.

## 2019-11-10 NOTE — Assessment & Plan Note (Signed)
R>L. S/p topical lotrimin use for 6 months. Will transition to terbinafine. If ineffective, will Rx oral terbinafine.

## 2019-11-10 NOTE — Assessment & Plan Note (Signed)
Deteriorated control however marked improvement over the past 2 weeks with fasting cbg readings consistently 90-130s. Continue [place]. Anticipate good control. He is motivated to follow diabetic diet.

## 2019-11-10 NOTE — Patient Instructions (Addendum)
We will request records from [facility] [place] [facility] are actually looking much better! Continue [place] 100mg  daily. Let me know if sugars start running too high or too low.  Return in 4 months for physical.  Pick up lamisil (terbinafine) topical over the counter antifungal to take daily in place of clotrimazole. If not improving in 2-4 weeks let me know for oral antifungal medicine (prescription).   The 15-15 rule for low sugars: If sugar reading below 70, have 15 grams of carbohydrate to raise your blood sugar and check it after 15 minutes. If it's still below 70 mg/dL, have another serving. 15 grams of carbs may be: -Glucose tablets (see instructions) -Gel tube (see instructions) -4 ounces (1/2 cup) of juice or regular soda (not diet) -1 tablespoon of sugar, honey, or corn syrup -Hard candies, jellybeans or gumdrops--see food label for how many to consume  Repeat these steps until your blood sugar is at least 70 mg/dL. Once your blood sugar is back to normal, eat a meal or snack to make sure it doesn't lower again.

## 2019-11-10 NOTE — Assessment & Plan Note (Signed)
Chronic, stable on amlodipine - continue.  

## 2019-11-12 ENCOUNTER — Encounter: Payer: Self-pay | Admitting: Family Medicine

## 2019-11-12 DIAGNOSIS — H18519 Endothelial corneal dystrophy, unspecified eye: Secondary | ICD-10-CM | POA: Insufficient documentation

## 2020-03-24 ENCOUNTER — Other Ambulatory Visit: Payer: Self-pay

## 2020-03-24 ENCOUNTER — Encounter: Payer: Self-pay | Admitting: Family Medicine

## 2020-03-24 ENCOUNTER — Other Ambulatory Visit: Payer: Self-pay | Admitting: Family Medicine

## 2020-03-24 ENCOUNTER — Ambulatory Visit (INDEPENDENT_AMBULATORY_CARE_PROVIDER_SITE_OTHER): Payer: No Typology Code available for payment source | Admitting: Family Medicine

## 2020-03-24 VITALS — BP 124/80 | HR 66 | Ht 71.75 in | Wt 227.4 lb

## 2020-03-24 DIAGNOSIS — E1165 Type 2 diabetes mellitus with hyperglycemia: Secondary | ICD-10-CM | POA: Diagnosis not present

## 2020-03-24 DIAGNOSIS — E291 Testicular hypofunction: Secondary | ICD-10-CM

## 2020-03-24 DIAGNOSIS — B353 Tinea pedis: Secondary | ICD-10-CM

## 2020-03-24 DIAGNOSIS — IMO0002 Reserved for concepts with insufficient information to code with codable children: Secondary | ICD-10-CM

## 2020-03-24 DIAGNOSIS — E669 Obesity, unspecified: Secondary | ICD-10-CM

## 2020-03-24 DIAGNOSIS — Z125 Encounter for screening for malignant neoplasm of prostate: Secondary | ICD-10-CM | POA: Diagnosis not present

## 2020-03-24 DIAGNOSIS — Z23 Encounter for immunization: Secondary | ICD-10-CM

## 2020-03-24 DIAGNOSIS — E785 Hyperlipidemia, unspecified: Secondary | ICD-10-CM

## 2020-03-24 DIAGNOSIS — E118 Type 2 diabetes mellitus with unspecified complications: Secondary | ICD-10-CM | POA: Diagnosis not present

## 2020-03-24 DIAGNOSIS — Z0001 Encounter for general adult medical examination with abnormal findings: Secondary | ICD-10-CM | POA: Diagnosis not present

## 2020-03-24 DIAGNOSIS — E1169 Type 2 diabetes mellitus with other specified complication: Secondary | ICD-10-CM

## 2020-03-24 DIAGNOSIS — I10 Essential (primary) hypertension: Secondary | ICD-10-CM

## 2020-03-24 LAB — POCT GLYCOSYLATED HEMOGLOBIN (HGB A1C): Hemoglobin A1C: 7 % — AB (ref 4.0–5.6)

## 2020-03-24 MED ORDER — TERBINAFINE HCL 250 MG PO TABS: 250.0000 mg | ORAL_TABLET | Freq: Every day | ORAL | 0 refills | Status: DC

## 2020-03-24 NOTE — Assessment & Plan Note (Signed)
R>L. Has not responded to topical lotrimin or terbinafine - trouble using daily. Will Rx terbinafine 250mg  daily x 2 wks.

## 2020-03-24 NOTE — Progress Notes (Signed)
Phoned in terbinafine due to E prescribing error.

## 2020-03-24 NOTE — Progress Notes (Signed)
This visit was conducted in person.  BP 124/80 (BP Location: Left Arm, Patient Position: Sitting, Cuff Size: Large)   Pulse 66   Ht 5' 11.75" (1.822 m)   Wt 227 lb 6 oz (103.1 kg)   SpO2 97%   BMI 31.05 kg/m    CC: CPE Subjective:    Patient ID: Gerald Collins, male    DOB: 1966-05-24, 53 y.o.   MRN: [id number]  HPI: Gerald Collins is a 54 y.o. male presenting on 03/24/2020 for Annual Exam   HTN - well controlled at home.  DM - not checking sugars regularly. Glucometer brand: [employer] meter pays out of pocket. Ongoing trouble with athlete's foot  Preventative: COLONOSCOPY 09/2015 hyperplastic polpy rpt 10 yrs Gerald Collins) Prostate cancer - no fmhx.asxs. Continue yearly PSA.  Lung cancer screen - not eligible  Flu - doesn't receive given bad reaction  Td 2012, Tdap 2018 COVID vaccine - 08/2019, 09/2019 Pneumovax - today Shingrix - discussed  Seat belt use discussed  Sunscreen use discussed.No changingmoleson skin.  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 Interim medical history since our last visit reviewed. Allergies and medications reviewed and updated. Outpatient Medications Prior to Visit  Medication Sig Dispense Refill  . amLODipine (NORVASC) 5 MG tablet Take 1 tablet (5 mg total) by mouth daily. 90 tablet 3  . atorvastatin (LIPITOR) 40 MG tablet Take 1 tablet (40 mg total) by mouth daily. 90 tablet 3  . sitaGLIPtin (JANUVIA) 50 MG tablet Take 1 tablet (50 mg total) by mouth daily. 90 tablet 3  . vitamin C (ASCORBIC ACID) 500 MG tablet Take 500 mg by mouth daily.     No facility-administered medications prior to visit.     Per HPI  unless specifically indicated in ROS section below Review of Systems  Constitutional: Negative for activity change, appetite change, chills, fatigue, fever and unexpected weight change.  HENT: Negative for hearing loss.   Eyes: Negative for visual disturbance.  Respiratory: Negative for cough, chest tightness, shortness of breath and wheezing.   Cardiovascular: Negative for chest pain, palpitations and leg swelling.  Gastrointestinal: Negative for abdominal distention, abdominal pain, blood in stool, constipation, diarrhea, nausea and vomiting.  Genitourinary: Negative for difficulty urinating and hematuria.  Musculoskeletal: Negative for arthralgias, myalgias and neck pain.  Skin: Negative for rash.  Neurological: Negative for dizziness, seizures, syncope and headaches.  Hematological: Negative for adenopathy. Does not bruise/bleed easily.  Psychiatric/Behavioral: Negative for dysphoric mood. The patient is not nervous/anxious.    Objective:  BP 124/80 (BP Location: Left Arm, Patient Position: Sitting, Cuff Size: Large)   Pulse 66   Ht 5' 11.75" (1.822 m)   Wt 227 lb 6 oz (103.1 kg)   SpO2 97%   BMI 31.05 kg/m   Wt Readings from Last 3 Encounters:  03/24/20 227 lb 6 oz (103.1 kg)  11/10/19 224 lb (101.6 kg)  10/25/19 223 lb 8 oz (101.4 kg)      Physical Exam Vitals and nursing note reviewed.  Constitutional:      General: He is not in acute distress.    Appearance: Normal appearance. He is well-developed. He  is not ill-appearing.  HENT:     Head: Normocephalic and atraumatic.     Right Ear: Hearing, tympanic membrane, ear canal and external ear normal.     Left Ear: Hearing, tympanic membrane, ear canal and external ear normal.  Eyes:     General: No scleral icterus.    Extraocular Movements: Extraocular movements intact.     Conjunctiva/sclera: Conjunctivae normal.     Pupils: Pupils are equal, round, and reactive to light.  Neck:     Thyroid: No thyroid mass or  thyromegaly.  Cardiovascular:     Rate and Rhythm: Normal rate and regular rhythm.     Pulses: Normal pulses.          Radial pulses are 2+ on the right side and 2+ on the left side.     Heart sounds: Normal heart sounds. No murmur heard.   Pulmonary:     Effort: Pulmonary effort is normal. No respiratory distress.     Breath sounds: Normal breath sounds. No wheezing, rhonchi or rales.  Abdominal:     General: Abdomen is flat. Bowel sounds are normal. There is no distension.     Palpations: Abdomen is soft. There is no mass.     Tenderness: There is no abdominal tenderness. There is no guarding or rebound.     Hernia: No hernia is present.  Musculoskeletal:        General: Normal range of motion.     Cervical back: Normal range of motion and neck supple.     Right lower leg: No edema.     Left lower leg: No edema.  Lymphadenopathy:     Cervical: No cervical adenopathy.  Skin:    General: Skin is warm and dry.     Findings: No rash.     Comments: Marked maceration bilateral toes R>L interdigital area spares 1st/2nd toe space  Neurological:     General: No focal deficit present.     Mental Status: He is alert and oriented to person, place, and time.     Comments: CN grossly intact, station and gait intact  Psychiatric:        Mood and Affect: Mood normal.        Behavior: Behavior normal.        Thought Content: Thought content normal.        Judgment: Judgment normal.       Results for orders placed or performed in visit on 03/24/20  POCT glycosylated hemoglobin (Hb A1C)  Result Value Ref Range   Hemoglobin A1C 7.0 (A) 4.0 - 5.6 %   HbA1c POC (<> result, manual entry)     HbA1c, POC (prediabetic range)     HbA1c, POC (controlled diabetic range)     Assessment & Plan:  This visit occurred during the SARS-CoV-2 public health emergency.  Safety protocols were in place, including screening questions prior to the visit, additional usage of staff PPE, and extensive cleaning of  exam room while observing appropriate contact time as indicated for disinfecting solutions.   Problem List Items Addressed This Visit    Uncontrolled diabetes mellitus with complications ([place])    Update A1c today.  Continue januvia, titrate based on A1c.       Relevant Orders   POCT glycosylated hemoglobin (Hb A1C) (Completed)   Microalbumin / creatinine urine ratio   Tinea pedis    R>L. Has not responded to topical lotrimin or terbinafine - trouble using daily. Will Rx terbinafine 250mg  daily  x 2 wks.       Relevant Medications   terbinafine (LAMISIL) 250 MG tablet   Other Relevant Orders   CBC with Differential/Platelet   Obesity, Class I, BMI 30-34.9    Encouraged healthy diet and lifestyle changes to affect sustainable weight loss.       Hypogonadism in male    H/o this but unaffordable/not covered by insurance . Check testosterone levels again - would be interested in treatment.       Relevant Orders   Testos,Total,Free and SHBG (Male)   Luteinizing hormone   Follicle Stimulating Hormone   Hyperlipidemia associated with type 2 diabetes mellitus (HCC)    Chronic, continues atorvastatin 40mg  daily. Update FLP  The 10-year ASCVD risk score Mikey Bussing DC Jr., et al., 2013) is: 18.2%   Values used to calculate the score:     Age: 41 years     Sex: Male     Is Non-Hispanic African American: Yes     Diabetic: Yes     Tobacco smoker: No     Systolic Blood Pressure: 237 mmHg     Is BP treated: Yes     HDL Cholesterol: 45 mg/dL     Total Cholesterol: 186 mg/dL       Relevant Orders   Lipid panel   Comprehensive metabolic panel   Essential hypertension    Chronic, stable on amlodipine - continue. Could consider ACEI. Check Umicroalb      Encounter for general adult medical examination with abnormal findings - Primary    Preventative protocols reviewed and updated unless pt declined. Discussed healthy diet and lifestyle.        Other Visit Diagnoses    Special  screening for malignant neoplasm of prostate       Relevant Orders   PSA   Need for 23-polyvalent pneumococcal polysaccharide vaccine       Relevant Orders   Pneumococcal polysaccharide vaccine 23-valent greater than or equal to 2yo subcutaneous/IM (Completed)       Meds ordered this encounter  Medications  . terbinafine (LAMISIL) 250 MG tablet    Sig: Take 1 tablet (250 mg total) by mouth daily.    Dispense:  14 tablet    Refill:  0   Orders Placed This Encounter  Procedures  . Pneumococcal polysaccharide vaccine 23-valent greater than or equal to 2yo subcutaneous/IM  . Lipid panel  . Comprehensive metabolic panel  . PSA  . CBC with Differential/Platelet  . Microalbumin / creatinine urine ratio  . Testos,Total,Free and SHBG (Male)  . Luteinizing hormone  . Follicle Stimulating Hormone  . POCT glycosylated hemoglobin (Hb A1C)    Patient instructions: Pneumovax today.  Labs today including fingerstick A1c.  Consider shingles vaccine - let us know for nurse visit if interested.  Return in 6 months for diabetes f/u.   Follow up plan: Return in about 6 months (around 09/22/2020) for follow up visit.  Ria Bush, MD

## 2020-03-24 NOTE — Assessment & Plan Note (Signed)
H/o this but unaffordable/not covered by insurance . Check testosterone levels again - would be interested in treatment.

## 2020-03-24 NOTE — Patient Instructions (Addendum)
Pneumovax today.  Labs today including fingerstick A1c.  Consider shingles vaccine - let us know for nurse visit if interested.  Return in 6 months for diabetes f/u.   Health Maintenance, Male Adopting a healthy lifestyle and getting preventive care are important in promoting health and wellness. Ask your health care provider about:  The right schedule for you to have regular tests and exams.  Things you can do on your own to prevent diseases and keep yourself healthy. What should I know about diet, weight, and exercise? Eat a healthy diet   Eat a diet that includes plenty of vegetables, fruits, low-fat dairy products, and lean protein.  Do not eat a lot of foods that are high in solid fats, added sugars, or sodium. Maintain a healthy weight Body mass index (BMI) is a measurement that can be used to identify possible weight problems. It estimates body fat based on height and weight. Your health care provider can help determine your BMI and help you achieve or maintain a healthy weight. Get regular exercise Get regular exercise. This is one of the most important things you can do for your health. Most adults should:  Exercise for at least 150 minutes each week. The exercise should increase your heart rate and make you sweat (moderate-intensity exercise).  Do strengthening exercises at least twice a week. This is in addition to the moderate-intensity exercise.  Spend less time sitting. Even light physical activity can be beneficial. Watch cholesterol and blood lipids Have your blood tested for lipids and cholesterol at 55 years of age, then have this test every 5 years. You may need to have your cholesterol levels checked more often if:  Your lipid or cholesterol levels are high.  You are older than 55 years of age.  You are at high risk for heart disease. What should I know about cancer screening? Many types of cancers can be detected early and may often be prevented. Depending  on your health history and family history, you may need to have cancer screening at various ages. This may include screening for:  Colorectal cancer.  Prostate cancer.  Skin cancer.  Lung cancer. What should I know about heart disease, diabetes, and high blood pressure? Blood pressure and heart disease  High blood pressure causes heart disease and increases the risk of stroke. This is more likely to develop in people who have high blood pressure readings, are of African descent, or are overweight.  Talk with your health care provider about your target blood pressure readings.  Have your blood pressure checked: ? Every 3-5 years if you are 42-60 years of age. ? Every year if you are 34 years old or older.  If you are between the ages of 56 and 72 and are a current or former smoker, ask your health care provider if you should have a one-time screening for abdominal aortic aneurysm (AAA). Diabetes Have regular diabetes screenings. This checks your fasting blood sugar level. Have the screening done:  Once every three years after age 58 if you are at a normal weight and have a low risk for diabetes.  More often and at a younger age if you are overweight or have a high risk for diabetes. What should I know about preventing infection? Hepatitis B If you have a higher risk for hepatitis B, you should be screened for this virus. Talk with your health care provider to find out if you are at risk for hepatitis B infection. Hepatitis C Blood  testing is recommended for:  Everyone born from [birth year] through 1966.  Anyone with known risk factors for hepatitis C. Sexually transmitted infections (STIs)  You should be screened each year for STIs, including gonorrhea and chlamydia, if: ? You are sexually active and are younger than 55 years of age. ? You are older than 55 years of age and your health care provider tells you that you are at risk for this type of infection. ? Your sexual activity has  changed since you were last screened, and you are at increased risk for chlamydia or gonorrhea. Ask your health care provider if you are at risk.  Ask your health care provider about whether you are at high risk for HIV. Your health care provider may recommend a prescription medicine to help prevent HIV infection. If you choose to take medicine to prevent HIV, you should first get tested for HIV. You should then be tested every 3 months for as long as you are taking the medicine. Follow these instructions at home: Lifestyle  Do not use any products that contain nicotine or tobacco, such as cigarettes, e-cigarettes, and chewing tobacco. If you need help quitting, ask your health care provider.  Do not use street drugs.  Do not share needles.  Ask your health care provider for help if you need support or information about quitting drugs. Alcohol use  Do not drink alcohol if your health care provider tells you not to drink.  If you drink alcohol: ? Limit how much you have to 0-2 drinks a day. ? Be aware of how much alcohol is in your drink. In the [place] one drink equals one 12 oz bottle of beer (355 mL), one 5 oz glass of wine (148 mL), or one 1 oz glass of hard liquor (44 mL). General instructions  Schedule regular health, dental, and eye exams.  Stay current with your vaccines.  Tell your health care provider if: ? You often feel depressed. ? You have ever been abused or do not feel safe at home. Summary  Adopting a healthy lifestyle and getting preventive care are important in promoting health and wellness.  Follow your health care provider's instructions about healthy diet, exercising, and getting tested or screened for diseases.  Follow your health care provider's instructions on monitoring your cholesterol and blood pressure. This information is not intended to replace advice given to you by your health care provider. Make sure you discuss any questions you have with your  health care provider. Document Revised: 05/20/2018 Document Reviewed: 05/20/2018 Elsevier Patient Education  2020 [employer].

## 2020-03-24 NOTE — Assessment & Plan Note (Signed)
Encouraged healthy diet and lifestyle changes to affect sustainable weight loss.  

## 2020-03-24 NOTE — Assessment & Plan Note (Signed)
Preventative protocols reviewed and updated unless pt declined. Discussed healthy diet and lifestyle.  

## 2020-03-24 NOTE — Addendum Note (Signed)
Addended by: Ellamae Sia on: 03/24/2020 11:37 AM   Modules accepted: Orders

## 2020-03-24 NOTE — Assessment & Plan Note (Signed)
Update A1c today.  Continue januvia, titrate based on A1c.

## 2020-03-24 NOTE — Assessment & Plan Note (Signed)
Chronic, stable on amlodipine - continue. Could consider ACEI. Check Umicroalb

## 2020-03-24 NOTE — Assessment & Plan Note (Addendum)
Chronic, continues atorvastatin 40mg  daily. Update FLP  The 10-year ASCVD risk score Gerald Bussing DC Jr., et al., 2013) is: 18.2%   Values used to calculate the score:     Age: 54 years     Sex: Male     Is Non-Hispanic African American: Yes     Diabetic: Yes     Tobacco smoker: No     Systolic Blood Pressure: 518 mmHg     Is BP treated: Yes     HDL Cholesterol: 45 mg/dL     Total Cholesterol: 186 mg/dL

## 2020-03-29 LAB — CBC WITH DIFFERENTIAL/PLATELET
Basophils Absolute: 0 10*3/uL (ref 0.0–0.2)
Basos: 1 %
EOS (ABSOLUTE): 0.1 10*3/uL (ref 0.0–0.4)
Eos: 2 %
Hematocrit: 42.7 % (ref 37.5–51.0)
Hemoglobin: 14.2 g/dL (ref 13.0–17.7)
Immature Grans (Abs): 0 10*3/uL (ref 0.0–0.1)
Immature Granulocytes: 0 %
Lymphocytes Absolute: 1.3 10*3/uL (ref 0.7–3.1)
Lymphs: 42 %
MCH: 27.1 pg (ref 26.6–33.0)
MCHC: 33.3 g/dL (ref 31.5–35.7)
MCV: 82 fL (ref 79–97)
Monocytes Absolute: 0.3 10*3/uL (ref 0.1–0.9)
Monocytes: 10 %
Neutrophils Absolute: 1.4 10*3/uL (ref 1.4–7.0)
Neutrophils: 45 %
Platelets: 158 10*3/uL (ref 150–450)
RBC: 5.24 x10E6/uL (ref 4.14–5.80)
RDW: 12.6 % (ref 11.6–15.4)
WBC: 3.2 10*3/uL — ABNORMAL LOW (ref 3.4–10.8)

## 2020-03-29 LAB — COMPREHENSIVE METABOLIC PANEL
ALT: 36 IU/L (ref 0–44)
AST: 28 IU/L (ref 0–40)
Albumin/Globulin Ratio: 2 (ref 1.2–2.2)
Albumin: 5 g/dL — ABNORMAL HIGH (ref 3.8–4.9)
Alkaline Phosphatase: 79 IU/L (ref 44–121)
BUN/Creatinine Ratio: 10 (ref 9–20)
BUN: 13 mg/dL (ref 6–24)
Bilirubin Total: 0.4 mg/dL (ref 0.0–1.2)
CO2: 27 mmol/L (ref 20–29)
Calcium: 9.7 mg/dL (ref 8.7–10.2)
Chloride: 100 mmol/L (ref 96–106)
Creatinine, Ser: 1.27 mg/dL (ref 0.76–1.27)
GFR calc Af Amer: 74 mL/min/{1.73_m2} (ref >59)
GFR calc non Af Amer: 64 mL/min/{1.73_m2} (ref >59)
Globulin, Total: 2.5 g/dL (ref 1.5–4.5)
Glucose: 149 mg/dL — ABNORMAL HIGH (ref 65–99)
Potassium: 4.6 mmol/L (ref 3.5–5.2)
Sodium: 141 mmol/L (ref 134–144)
Total Protein: 7.5 g/dL (ref 6.0–8.5)

## 2020-03-29 LAB — MICROALBUMIN / CREATININE URINE RATIO
Creatinine, Urine: 160.6 mg/dL
Microalb/Creat Ratio: 9 mg/g creat (ref 0–29)
Microalbumin, Urine: 14.5 ug/mL

## 2020-03-29 LAB — LIPID PANEL
Chol/HDL Ratio: 3.9 ratio (ref 0.0–5.0)
Cholesterol, Total: 210 mg/dL — ABNORMAL HIGH (ref 100–199)
HDL: 54 mg/dL (ref >39)
LDL Chol Calc (NIH): 136 mg/dL — ABNORMAL HIGH (ref 0–99)
Triglycerides: 112 mg/dL (ref 0–149)
VLDL Cholesterol Cal: 20 mg/dL (ref 5–40)

## 2020-03-29 LAB — TESTOSTERONE, FREE, TOTAL, SHBG
Sex Hormone Binding: 24.3 nmol/L (ref 19.3–76.4)
Testosterone, Free: 6.1 pg/mL — ABNORMAL LOW (ref 7.2–24.0)
Testosterone: 361 ng/dL (ref 264–916)

## 2020-03-29 LAB — LUTEINIZING HORMONE: LH: 7.1 m[IU]/mL (ref 1.7–8.6)

## 2020-03-29 LAB — FOLLICLE STIMULATING HORMONE: FSH: 9.5 m[IU]/mL (ref 1.5–12.4)

## 2020-03-29 LAB — PSA: Prostate Specific Ag, Serum: 0.2 ng/mL (ref 0.0–4.0)

## 2020-06-03 ENCOUNTER — Encounter: Payer: Self-pay | Admitting: Family Medicine

## 2020-06-21 ENCOUNTER — Telehealth: Payer: Self-pay | Admitting: Family Medicine

## 2020-06-21 NOTE — Telephone Encounter (Signed)
  Pt sent message to Mychart requesting something for his sinus That to far out my sinus is killing me. Can he just send me something to the pharmacy

## 2020-06-21 NOTE — Telephone Encounter (Signed)
Agree he shouldn't take decongestant due to his HTN.  Recommend he start OTC nasal steroid like flonase and antihistamine like zyrtec or allegra. Take this daily for a week and if not improving let us know, sooner if fever or worsening one sided facial pain / sinus pressure headache.

## 2020-06-21 NOTE — Telephone Encounter (Signed)
Called and spoke with patient regarding this issue. Patient stated that for the past month he has had sinus drainage that gets worse in the evenings. Patient denies fever, SOB, chills, headache, or other covid symptoms. Patient stated that he had taken a covid test right before the new year, which was negative. Patient stated that he was taking Sudafed, but it was making his blood pressure increase, so he stopped taking it. Please advise.

## 2020-06-22 NOTE — Telephone Encounter (Signed)
Spoke with pt relaying Dr. G's message.  Pt verbalizes understanding and expresses his thanks.  

## 2020-06-27 ENCOUNTER — Telehealth (INDEPENDENT_AMBULATORY_CARE_PROVIDER_SITE_OTHER): Payer: No Typology Code available for payment source | Admitting: Family Medicine

## 2020-06-27 ENCOUNTER — Encounter: Payer: Self-pay | Admitting: Family Medicine

## 2020-06-27 ENCOUNTER — Other Ambulatory Visit: Payer: Self-pay | Admitting: Family Medicine

## 2020-06-27 DIAGNOSIS — R0982 Postnasal drip: Secondary | ICD-10-CM | POA: Insufficient documentation

## 2020-06-27 MED ORDER — AZELASTINE HCL 0.1 % NA SOLN: 2.0000 | Freq: Two times a day (BID) | NASAL | 3 refills | Status: DC

## 2020-06-27 NOTE — Progress Notes (Signed)
Patient ID: Gerald Collins, male    DOB: 09/15/1965, 55 y.o.   MRN: [id number]  Virtual visit completed through [place], a video enabled telemedicine application. Due to national recommendations of social distancing due to COVID-19, a virtual visit is felt to be most appropriate for this patient at this time. Reviewed limitations, risks, security and privacy concerns of performing a virtual visit and the availability of in person appointments. I also reviewed that there may be a patient responsible charge related to this service. The patient agreed to proceed.   Patient location: in his car  Provider location: [place] at [facility], office Persons participating in this virtual visit: patient, provider   If any vitals were documented, they were collected by patient at home unless specified below.    Temp 97.6 F (36.4 C)   Ht 5' 11.75" (1.822 m)   Wt 218 lb (98.9 kg)   BMI 29.77 kg/m    CC: sinus congestion Subjective:   HPI: Gerald Collins is a 55 y.o. male presenting on 06/27/2020 for Sinus Problem (C/o postnasal drip, nasal congestion and drainage.  Started about 1 mo ago.  Trying suggested Flonase and OTC antihistamine, helping. )   1 mo h/o PNdrainage, congestion, sinus pressure managing with flonase and corcedin with benefit. Acutely worse the past 2 weeks. Sudafed helped but caused increased blood pressures so now off this. Worse at night time when he's trying to sleep. Head > chest congestion.   No fevers/chills, ear or tooth pain, HA, cough, ST, itchy watery eyes. No significant facial pain/pressure.  Intermittent h/o allergic rhinitis.  No personal or family h/o glaucoma.   Wife tested positive for COVID 06/03/2020.  Pt tested negative.   Gerald Collins at [employer].      Relevant past medical, surgical, family and social history reviewed and updated as indicated. Interim medical history since our last visit reviewed. Allergies and medications reviewed and  updated. Outpatient Medications Prior to Visit  Medication Sig Dispense Refill  . amLODipine (NORVASC) 5 MG tablet Take 1 tablet (5 mg total) by mouth daily. 90 tablet 3  . atorvastatin (LIPITOR) 40 MG tablet Take 1 tablet (40 mg total) by mouth daily. 90 tablet 3  . sitaGLIPtin (JANUVIA) 50 MG tablet Take 1 tablet (50 mg total) by mouth daily. 90 tablet 3  . terbinafine (LAMISIL) 250 MG tablet Take 1 tablet (250 mg total) by mouth daily. 14 tablet 0  . vitamin C (ASCORBIC ACID) 500 MG tablet Take 500 mg by mouth daily.     No facility-administered medications prior to visit.     Per HPI unless specifically indicated in ROS section below Review of Systems Objective:  Temp 97.6 F (36.4 C)   Ht 5' 11.75" (1.822 m)   Wt 218 lb (98.9 kg)   BMI 29.77 kg/m   Wt Readings from Last 3 Encounters:  06/27/20 218 lb (98.9 kg)  03/24/20 227 lb 6 oz (103.1 kg)  11/10/19 224 lb (101.6 kg)       Physical exam: Gen: alert, NAD, not ill appearing Pulm: speaks in complete sentences without increased work of breathing Psych: normal mood, normal thought content      Assessment & Plan:   Problem List Items Addressed This Visit    Post-nasal drainage    Anticipate allergic - rec continue flonase nasal spray and start nasal antihistamine (he was unable to find oral antihistamine due to shortages). Doubt sinus infection given lack of sinus pressure pain, fever,  bilateral nature of symptoms. Update if these symptoms develop for abx course. Pt agrees with plan.           Meds ordered this encounter  Medications  . azelastine (ASTELIN) 0.1 % nasal spray    Sig: Place 2 sprays into both nostrils 2 (two) times daily. Use in each nostril as directed    Dispense:  30 mL    Refill:  3   No orders of the defined types were placed in this encounter.   I discussed the assessment and treatment plan with the patient. The patient was provided an opportunity to ask questions and all were answered. The  patient agreed with the plan and demonstrated an understanding of the instructions. The patient was advised to call back or seek an in-person evaluation if the symptoms worsen or if the condition fails to improve as anticipated.  Follow up plan: Return if symptoms worsen or fail to improve.  Ria Bush, MD

## 2020-06-27 NOTE — Assessment & Plan Note (Signed)
Anticipate allergic - rec continue flonase nasal spray and start nasal antihistamine (he was unable to find oral antihistamine due to shortages). Doubt sinus infection given lack of sinus pressure pain, fever, bilateral nature of symptoms. Update if these symptoms develop for abx course. Pt agrees with plan.

## 2020-09-29 ENCOUNTER — Other Ambulatory Visit: Payer: Self-pay

## 2020-09-29 ENCOUNTER — Ambulatory Visit: Payer: No Typology Code available for payment source | Admitting: Family Medicine

## 2020-09-29 MED FILL — Amlodipine Besylate Tab 5 MG (Base Equivalent): ORAL | 90 days supply | Qty: 90 | Fill #0 | Status: AC

## 2020-09-29 MED FILL — Atorvastatin Calcium Tab 40 MG (Base Equivalent): ORAL | 90 days supply | Qty: 90 | Fill #0 | Status: AC

## 2020-09-29 MED FILL — Sitagliptin Phosphate Tab 50 MG (Base Equiv): ORAL | 30 days supply | Qty: 30 | Fill #0 | Status: AC

## 2020-10-03 ENCOUNTER — Other Ambulatory Visit: Payer: Self-pay

## 2020-10-03 ENCOUNTER — Encounter: Payer: Self-pay | Admitting: Family Medicine

## 2020-10-03 ENCOUNTER — Ambulatory Visit (INDEPENDENT_AMBULATORY_CARE_PROVIDER_SITE_OTHER): Payer: No Typology Code available for payment source | Admitting: Family Medicine

## 2020-10-03 VITALS — BP 130/78 | HR 67 | Temp 97.8°F | Ht 71.75 in | Wt 222.4 lb

## 2020-10-03 DIAGNOSIS — B351 Tinea unguium: Secondary | ICD-10-CM | POA: Insufficient documentation

## 2020-10-03 DIAGNOSIS — E1165 Type 2 diabetes mellitus with hyperglycemia: Secondary | ICD-10-CM | POA: Diagnosis not present

## 2020-10-03 DIAGNOSIS — IMO0002 Reserved for concepts with insufficient information to code with codable children: Secondary | ICD-10-CM

## 2020-10-03 DIAGNOSIS — E118 Type 2 diabetes mellitus with unspecified complications: Secondary | ICD-10-CM | POA: Diagnosis not present

## 2020-10-03 DIAGNOSIS — B353 Tinea pedis: Secondary | ICD-10-CM | POA: Diagnosis not present

## 2020-10-03 LAB — POCT GLYCOSYLATED HEMOGLOBIN (HGB A1C): Hemoglobin A1C: 7.1 % — AB (ref 4.0–5.6)

## 2020-10-03 MED ORDER — TERBINAFINE HCL 250 MG PO TABS
250.0000 mg | ORAL_TABLET | Freq: Every day | ORAL | 0 refills | Status: DC
  Filled 2020-10-03: qty 45, 45d supply, fill #0

## 2020-10-03 NOTE — Progress Notes (Signed)
Patient ID: Gerald Collins, male    DOB: 03/03/1966, 55 y.o.   MRN: [id number]  This visit was conducted in person.  BP 130/78   Pulse 67   Temp 97.8 F (36.6 C) (Temporal)   Ht 5' 11.75" (1.822 m)   Wt 222 lb 6 oz (100.9 kg)   SpO2 97%   BMI 30.37 kg/m    CC: 55mo DM f/u visit  Subjective:   HPI: Gerald Collins is a 55 y.o. male presenting on 10/03/2020 for Follow-up (Here for 6 mo f/u.)   Tinea pedis - failed topical therapies. Last visit we prescribed 2 wks of terbinafine.   DM - does regularly check sugars 85-150. Compliant with antihyperglycemic regimen which includes: [place] 50mg  daily. Denies low sugars or hypoglycemic symptoms. Denies paresthesias. Last diabetic eye exam DUE. Pneumovax: 03/2020. Glucometer brand: [employer] meter. DSME: completed. Lab Results  Component Value Date   HGBA1C 7.1 (A) 10/03/2020   Diabetic Foot Exam - Simple   Simple Foot Form Diabetic Foot exam was performed with the following findings: Yes 10/03/2020  9:00 AM  Visual Inspection No deformities, no ulcerations, no other skin breakdown bilaterally: Yes Sensation Testing Intact to touch and monofilament testing bilaterally: Yes Pulse Check Posterior Tibialis and Dorsalis pulse intact bilaterally: Yes Comments Onychomycosis L great toenail    Lab Results  Component Value Date   MICROALBUR 1.3 10/01/2013         Relevant past medical, surgical, family and social history reviewed and updated as indicated. Interim medical history since our last visit reviewed. Allergies and medications reviewed and updated. Outpatient Medications Prior to Visit  Medication Sig Dispense Refill  . amLODipine (NORVASC) 5 MG tablet TAKE 1 TABLET (5 MG TOTAL) BY MOUTH DAILY. 90 tablet 3  . atorvastatin (LIPITOR) 40 MG tablet TAKE 1 TABLET (40 MG TOTAL) BY MOUTH DAILY. 90 tablet 3  . azelastine (ASTELIN) 0.1 % nasal spray PLACE 2 SPRAYS INTO BOTH NOSTRILS 2 TIMES DAILY. USE IN EACH NOSTRIL AS DIRECTED 30 mL 3   . sitaGLIPtin (JANUVIA) 50 MG tablet TAKE 1 TABLET (50 MG TOTAL) BY MOUTH DAILY. 90 tablet 3  . vitamin C (ASCORBIC ACID) 500 MG tablet Take 500 mg by mouth daily.    Marland Kitchen terbinafine (LAMISIL) 250 MG tablet TAKE 1 TABLET BY MOUTH ONCE DAILY 14 tablet 0   No facility-administered medications prior to visit.     Per HPI unless specifically indicated in ROS section below Review of Systems Objective:  BP 130/78   Pulse 67   Temp 97.8 F (36.6 C) (Temporal)   Ht 5' 11.75" (1.822 m)   Wt 222 lb 6 oz (100.9 kg)   SpO2 97%   BMI 30.37 kg/m   Wt Readings from Last 3 Encounters:  10/03/20 222 lb 6 oz (100.9 kg)  06/27/20 218 lb (98.9 kg)  03/24/20 227 lb 6 oz (103.1 kg)      Physical Exam Vitals and nursing note reviewed.  Constitutional:      General: He is not in acute distress.    Appearance: Normal appearance. He is well-developed. He is not ill-appearing.  Eyes:     General: No scleral icterus.    Extraocular Movements: Extraocular movements intact.     Conjunctiva/sclera: Conjunctivae normal.     Pupils: Pupils are equal, round, and reactive to light.  Cardiovascular:     Rate and Rhythm: Normal rate and regular rhythm.     Pulses: Normal pulses.  Heart sounds: Normal heart sounds. No murmur heard.   Pulmonary:     Effort: Pulmonary effort is normal. No respiratory distress.     Breath sounds: Normal breath sounds. No wheezing, rhonchi or rales.  Musculoskeletal:     Cervical back: Normal range of motion and neck supple.     Comments: See HPI for foot exam if done  Lymphadenopathy:     Cervical: No cervical adenopathy.  Skin:    General: Skin is warm and dry.     Findings: No rash.  Neurological:     Mental Status: He is alert.       Results for orders placed or performed in visit on 10/03/20  POCT glycosylated hemoglobin (Hb A1C)  Result Value Ref Range   Hemoglobin A1C 7.1 (A) 4.0 - 5.6 %   HbA1c POC (<> result, manual entry)     HbA1c, POC (prediabetic  range)     HbA1c, POC (controlled diabetic range)     Assessment & Plan:  This visit occurred during the SARS-CoV-2 public health emergency.  Safety protocols were in place, including screening questions prior to the visit, additional usage of staff PPE, and extensive cleaning of exam room while observing appropriate contact time as indicated for disinfecting solutions.   Problem List Items Addressed This Visit    Uncontrolled diabetes mellitus with complications ([place]) - Primary    Chronic, stable. Continue [place].  Encouraged ongoing efforts at healthy diet choices.  Pt agrees with plan.       Relevant Orders   POCT glycosylated hemoglobin (Hb A1C) (Completed)   Tinea pedis    Significant improvement after 2 wk terbinafine course.       Relevant Medications   terbinafine (LAMISIL) 250 MG tablet   Onychomycosis    Rx 6 wk terbinafine course      Relevant Medications   terbinafine (LAMISIL) 250 MG tablet       Meds ordered this encounter  Medications  . terbinafine (LAMISIL) 250 MG tablet    Sig: Take 1 tablet (250 mg total) by mouth daily.    Dispense:  45 tablet    Refill:  0   Orders Placed This Encounter  Procedures  . POCT glycosylated hemoglobin (Hb A1C)    Patient Instructions  Ask to have diabetes checked at upcoming eye appointment and ask to send me records.  Take terbinafine 6 week course for fungal infection of nails. We will recheck at physical. Return in 6 months for physical.   Follow up plan: Return in about 6 weeks (around 11/14/2020) for annual exam, prior fasting for blood work.  Ria Bush, MD

## 2020-10-03 NOTE — Assessment & Plan Note (Signed)
Rx 6 wk terbinafine course

## 2020-10-03 NOTE — Assessment & Plan Note (Signed)
Significant improvement after 2 wk terbinafine course.

## 2020-10-03 NOTE — Patient Instructions (Addendum)
Ask to have diabetes checked at upcoming eye appointment and ask to send me records.  Take terbinafine 6 week course for fungal infection of nails. We will recheck at physical. Return in 6 months for physical.

## 2020-10-03 NOTE — Assessment & Plan Note (Signed)
Chronic, stable. Continue [place].  Encouraged ongoing efforts at healthy diet choices.  Pt agrees with plan.

## 2020-11-09 ENCOUNTER — Other Ambulatory Visit: Payer: Self-pay

## 2020-11-09 MED FILL — Sitagliptin Phosphate Tab 50 MG (Base Equiv): ORAL | 30 days supply | Qty: 30 | Fill #1 | Status: AC

## 2020-11-20 ENCOUNTER — Encounter: Payer: Self-pay | Admitting: Family Medicine

## 2020-11-20 ENCOUNTER — Other Ambulatory Visit: Payer: Self-pay

## 2020-11-20 ENCOUNTER — Ambulatory Visit (INDEPENDENT_AMBULATORY_CARE_PROVIDER_SITE_OTHER): Payer: No Typology Code available for payment source | Admitting: Family Medicine

## 2020-11-20 DIAGNOSIS — L299 Pruritus, unspecified: Secondary | ICD-10-CM | POA: Diagnosis not present

## 2020-11-20 MED ORDER — HYDROCORTISONE-ACETIC ACID 1-2 % OT SOLN: 3.0000 [drp] | Freq: Three times a day (TID) | OTIC | 0 refills | Status: DC

## 2020-11-20 NOTE — Patient Instructions (Signed)
Ear overall looking ok. Possible irritation to posterior canal.  Treat with mineral oil a few drops to left ear in the mornings.  Let us know if not improved with this.

## 2020-11-20 NOTE — Progress Notes (Signed)
Patient ID: Gerald Collins, male    DOB: August 11, 1965, 55 y.o.   MRN: [id number]  This visit was conducted in person.  BP 126/76   Pulse 71   Temp (!) 97.2 F (36.2 C) (Temporal)   Ht 5' 11.75" (1.822 m)   Wt 230 lb (104.3 kg)   BMI 31.41 kg/m    CC: L ear itching Subjective:   HPI: Gerald Collins is a 55 y.o. male presenting on 11/20/2020 for Ear Fullness (Left ear itching at night more than during the day. Left ear only x 3 weeks. )   3 wk h/o itchy left ear associated with change in sensation and pressure feeling.  No recent swimming, drainage from ear, hearing changes or ringing in the ear.  No fevers/chills, congestion, headache.      Relevant past medical, surgical, family and social history reviewed and updated as indicated. Interim medical history since our last visit reviewed. Allergies and medications reviewed and updated. Outpatient Medications Prior to Visit  Medication Sig Dispense Refill   amLODipine (NORVASC) 5 MG tablet TAKE 1 TABLET (5 MG TOTAL) BY MOUTH DAILY. 90 tablet 3   atorvastatin (LIPITOR) 40 MG tablet TAKE 1 TABLET (40 MG TOTAL) BY MOUTH DAILY. 90 tablet 3   azelastine (ASTELIN) 0.1 % nasal spray PLACE 2 SPRAYS INTO BOTH NOSTRILS 2 TIMES DAILY. USE IN EACH NOSTRIL AS DIRECTED 30 mL 3   sitaGLIPtin (JANUVIA) 50 MG tablet TAKE 1 TABLET (50 MG TOTAL) BY MOUTH DAILY. 90 tablet 3   terbinafine (LAMISIL) 250 MG tablet Take 1 tablet (250 mg total) by mouth daily. 45 tablet 0   vitamin C (ASCORBIC ACID) 500 MG tablet Take 500 mg by mouth daily.     No facility-administered medications prior to visit.     Per HPI unless specifically indicated in ROS section below Review of Systems Objective:  BP 126/76   Pulse 71   Temp (!) 97.2 F (36.2 C) (Temporal)   Ht 5' 11.75" (1.822 m)   Wt 230 lb (104.3 kg)   BMI 31.41 kg/m   Wt Readings from Last 3 Encounters:  11/20/20 230 lb (104.3 kg)  10/03/20 222 lb 6 oz (100.9 kg)  06/27/20 218 lb (98.9 kg)       Physical Exam Vitals and nursing note reviewed.  Constitutional:      Appearance: Normal appearance. He is not ill-appearing.  HENT:     Right Ear: Hearing, tympanic membrane, ear canal and external ear normal. No decreased hearing noted. Tympanic membrane is not perforated.     Left Ear: Hearing, tympanic membrane, ear canal and external ear normal. No decreased hearing noted. Tympanic membrane is not perforated.     Ears:     Comments:  No wax Overall clear canals bilaterally Slight irritation to posterior left canal without drainage or lesion Lymphadenopathy:     Head:     Right side of head: No submental, submandibular, tonsillar, preauricular, posterior auricular or occipital adenopathy.     Left side of head: No submental, submandibular, tonsillar, preauricular, posterior auricular or occipital adenopathy.     Cervical: No cervical adenopathy.  Neurological:     Mental Status: He is alert.      Results for orders placed or performed in visit on 10/03/20  POCT glycosylated hemoglobin (Hb A1C)  Result Value Ref Range   Hemoglobin A1C 7.1 (A) 4.0 - 5.6 %   HbA1c POC (<> result, manual entry)  HbA1c, POC (prediabetic range)     HbA1c, POC (controlled diabetic range)     Assessment & Plan:  This visit occurred during the SARS-CoV-2 public health emergency.  Safety protocols were in place, including screening questions prior to the visit, additional usage of staff PPE, and extensive cleaning of exam room while observing appropriate contact time as indicated for disinfecting solutions.   Problem List Items Addressed This Visit     Itching of ear    Benign exam today.  ?slight irritation of ear canal skin - suggested mineral oil and if ineffective may try vosol HC drops Rx printed out today.  Update if not better with above. Pt agrees with plan.          Meds ordered this encounter  Medications   acetic acid-hydrocortisone (VOSOL-HC) OTIC solution    Sig: Place 3  drops into the left ear 3 (three) times daily.    Dispense:  10 mL    Refill:  0   No orders of the defined types were placed in this encounter.   Patient Instructions  Ear overall looking ok. Possible irritation to posterior canal.  Treat with mineral oil a few drops to left ear in the mornings.  Let us know if not improved with this.   Follow up plan: No follow-ups on file.  Ria Bush, MD

## 2020-11-20 NOTE — Assessment & Plan Note (Signed)
Benign exam today.  ?slight irritation of ear canal skin - suggested mineral oil and if ineffective may try vosol HC drops Rx printed out today.  Update if not better with above. Pt agrees with plan.

## 2020-12-07 ENCOUNTER — Other Ambulatory Visit: Payer: Self-pay

## 2020-12-07 MED ORDER — HYDROCORTISONE-ACETIC ACID 1-2 % OT SOLN
OTIC | 0 refills | Status: DC
  Filled 2020-12-07: qty 10, 16d supply, fill #0

## 2020-12-08 ENCOUNTER — Other Ambulatory Visit: Payer: Self-pay

## 2020-12-14 ENCOUNTER — Other Ambulatory Visit: Payer: Self-pay | Admitting: Family Medicine

## 2020-12-14 ENCOUNTER — Other Ambulatory Visit: Payer: Self-pay

## 2020-12-14 MED ORDER — SITAGLIPTIN PHOSPHATE 50 MG PO TABS
ORAL_TABLET | Freq: Every day | ORAL | 1 refills | Status: DC
  Filled 2020-12-14: qty 90, 90d supply, fill #0
  Filled 2020-12-15: qty 30, 30d supply, fill #0
  Filled 2021-01-18: qty 30, 30d supply, fill #1
  Filled 2021-02-16: qty 30, 30d supply, fill #2
  Filled 2021-03-22: qty 30, 30d supply, fill #3
  Filled 2021-04-19: qty 30, 30d supply, fill #4

## 2020-12-15 ENCOUNTER — Other Ambulatory Visit: Payer: Self-pay

## 2020-12-22 ENCOUNTER — Ambulatory Visit (INDEPENDENT_AMBULATORY_CARE_PROVIDER_SITE_OTHER): Payer: No Typology Code available for payment source | Admitting: Internal Medicine

## 2020-12-22 ENCOUNTER — Encounter: Payer: Self-pay | Admitting: Internal Medicine

## 2020-12-22 ENCOUNTER — Other Ambulatory Visit: Payer: Self-pay

## 2020-12-22 DIAGNOSIS — H9202 Otalgia, left ear: Secondary | ICD-10-CM | POA: Insufficient documentation

## 2020-12-22 DIAGNOSIS — H938X2 Other specified disorders of left ear: Secondary | ICD-10-CM | POA: Insufficient documentation

## 2020-12-22 MED ORDER — PREDNISONE 20 MG PO TABS
40.0000 mg | ORAL_TABLET | Freq: Every day | ORAL | 0 refills | Status: DC
  Filled 2020-12-22: qty 9, 6d supply, fill #0

## 2020-12-22 NOTE — Assessment & Plan Note (Addendum)
Seems likely to be related to middle ear issue (?Eustachian tube) Asked him to restart his astelin Will give prednisone burst To ENT Lucia Gaskins or [place]) if persists next week

## 2020-12-22 NOTE — Patient Instructions (Signed)
If you are not improved next week, let me know and I will set up an appt with an ENT doctor

## 2020-12-22 NOTE — Progress Notes (Signed)
Subjective:    Patient ID: Gerald Collins, male    DOB: Aug 29, 1965, 55 y.o.   MRN: [id number]  HPI Here due to left ear pain This visit occurred during the SARS-CoV-2 public health emergency.  Safety protocols were in place, including screening questions prior to the visit, additional usage of staff PPE, and extensive cleaning of exam room while observing appropriate contact time as indicated for disinfecting solutions.   Had itching in left ear Seen a month ago Now has more of a throbbing sensation---like with activity and in bed (gets funny feeling in it) Feels like it is deep inside Acetic acid/HC drops not really helpful (other than the itching)  Current Outpatient Medications on File Prior to Visit  Medication Sig Dispense Refill   acetic acid-hydrocortisone (VOSOL-HC) OTIC solution Place 3 drops into the left ear 3 (three) times daily. 10 mL 0   amLODipine (NORVASC) 5 MG tablet TAKE 1 TABLET (5 MG TOTAL) BY MOUTH DAILY. 90 tablet 3   atorvastatin (LIPITOR) 40 MG tablet TAKE 1 TABLET (40 MG TOTAL) BY MOUTH DAILY. 90 tablet 3   azelastine (ASTELIN) 0.1 % nasal spray PLACE 2 SPRAYS INTO BOTH NOSTRILS 2 TIMES DAILY. USE IN EACH NOSTRIL AS DIRECTED 30 mL 3   sitaGLIPtin (JANUVIA) 50 MG tablet Take by mouth daily. 90 tablet 1   terbinafine (LAMISIL) 250 MG tablet Take 1 tablet (250 mg total) by mouth daily. 45 tablet 0   vitamin C (ASCORBIC ACID) 500 MG tablet Take 500 mg by mouth daily.     No current facility-administered medications on file prior to visit.    Allergies  Allergen Reactions   Latex Shortness Of Breath and Itching   Metformin     blurry vision    Past Medical History:  Diagnosis Date   Diabetes type 2, controlled ([place]) 2012   controlled with lifestyle (diet, exercise) change   H/O: pneumonia 06/11/1987   HLD (hyperlipidemia)    HTN (hypertension)    no meds    Other specified visual disturbances     Past Surgical History:  Procedure Laterality Date    COLONOSCOPY  09/2015   hyperplastic polpy rpt 10 yrs Henrene Pastor)   SHOULDER ARTHROSCOPY WITH OPEN ROTATOR CUFF REPAIR Right 04/01/2019   RIGHT SHOULDER ARTHROSCOPY, ARTHROSCOPIC LABRAL REPAIR, SUBACROMIAL DECOMPRESSION, DISTAL CLAVICLE EXCISION, MINI OPEN ROTATOR CUFF REPAIR;  Thornton Park, MD   VASECTOMY  1998    Family History  Problem Relation Age of Onset   Healthy Father    Hypertension Mother    Cancer Mother        ovarian   Colon polyps Mother    Hyperlipidemia Mother    Coronary artery disease Maternal Grandfather 34       MI   Other Brother        Prediabetes   Cancer Maternal Uncle        unknown form with mets   Colon polyps Maternal Aunt     Social History   Socioeconomic History   Marital status: Married    Spouse name: Not on file   Number of children: 2   Years of education: Not on file   Highest education level: Not on file  Occupational History   Occupation: Technical brewer: [employer]  Tobacco Use   Smoking status: Former    Types: Cigars   Smokeless tobacco: Never   Tobacco comments:    on occasion only-never regularly  Vaping Use  Vaping Use: Never used  Substance and Sexual Activity   Alcohol use: Yes    Alcohol/week: 0.0 standard drinks    Comment: Rare   Drug use: No   Sexual activity: Not on file  Other Topics Concern   Not on file  Social History Narrative   Caffeine: 0   Lives with wife Investment banker, corporate), 2 kids, no pets   Lab Tech at [employer]   Activity: 1-2 hours every morning combination of weights and aerobic exercise   Diet: good water, fruits/vegetables daily   Social Determinants of Health   Financial Resource Strain: Not on file  Food Insecurity: Not on file  Transportation Needs: Not on file  Physical Activity: Not on file  Stress: Not on file  Social Connections: Not on file  Intimate Partner Violence: Not on file   Review of Systems No change in hearing No tinnitus Did travel in plane to [place] in  May--doesn't remember if he popped his ears No URI symptoms Sinuses are stuffy but not draining like they usually would    Objective:   Physical Exam Constitutional:      Appearance: Normal appearance.  HENT:     Right Ear: Tympanic membrane and ear canal normal.     Left Ear: Ear canal normal.     Ears:     Comments: No tragal tenderness either side Absent light reflex on left---no clear effusion    Nose:     Comments: Moderate swelling on left    Mouth/Throat:     Pharynx: No oropharyngeal exudate or posterior oropharyngeal erythema.  Neurological:     Mental Status: He is alert.           Assessment & Plan:

## 2020-12-26 ENCOUNTER — Ambulatory Visit: Payer: No Typology Code available for payment source | Admitting: Family Medicine

## 2021-01-18 ENCOUNTER — Other Ambulatory Visit: Payer: Self-pay | Admitting: Family Medicine

## 2021-01-18 ENCOUNTER — Other Ambulatory Visit: Payer: Self-pay

## 2021-01-18 MED ORDER — AMLODIPINE BESYLATE 5 MG PO TABS
ORAL_TABLET | Freq: Every day | ORAL | 0 refills | Status: DC
  Filled 2021-01-18: qty 90, 90d supply, fill #0

## 2021-01-18 NOTE — Telephone Encounter (Signed)
E-scribed refill.  Plz schedule lab and cpe visits.  

## 2021-01-19 ENCOUNTER — Other Ambulatory Visit: Payer: Self-pay

## 2021-01-19 NOTE — Telephone Encounter (Signed)
Called pt to schedule appt. Pt did not answer so I left a voicemail. 

## 2021-01-29 ENCOUNTER — Telehealth: Payer: Self-pay | Admitting: Family Medicine

## 2021-01-29 DIAGNOSIS — H9202 Otalgia, left ear: Secondary | ICD-10-CM

## 2021-01-29 NOTE — Telephone Encounter (Addendum)
Plz notify referral placed.

## 2021-01-29 NOTE — Addendum Note (Signed)
Addended by: Ria Bush on: 01/29/2021 01:45 PM   Modules accepted: Orders

## 2021-01-29 NOTE — Telephone Encounter (Signed)
Called the patient to inform him that the referral was sent to in for ENT in the [facility] area and informed him if he didn't hear anything back from someone by Friday to let us know.

## 2021-01-29 NOTE — Telephone Encounter (Signed)
Mr. Donnovan called in wanted to know about getting a referral for ENT in the [place] area.

## 2021-02-16 ENCOUNTER — Other Ambulatory Visit: Payer: Self-pay

## 2021-03-01 ENCOUNTER — Other Ambulatory Visit: Payer: Self-pay

## 2021-03-01 MED ORDER — PREDNISONE 10 MG PO TABS
ORAL_TABLET | ORAL | 0 refills | Status: DC
  Filled 2021-03-01: qty 21, 6d supply, fill #0

## 2021-03-01 MED ORDER — FLUTICASONE PROPIONATE 50 MCG/ACT NA SUSP
2.0000 | Freq: Every day | NASAL | 12 refills | Status: DC
  Filled 2021-03-01: qty 16, 30d supply, fill #0
  Filled 2022-02-20: qty 16, 30d supply, fill #1

## 2021-03-01 MED ORDER — AZELASTINE HCL 0.1 % NA SOLN
NASAL | 12 refills | Status: DC
  Filled 2021-03-01: qty 30, 30d supply, fill #0

## 2021-03-08 ENCOUNTER — Encounter: Payer: Self-pay | Admitting: Family Medicine

## 2021-03-08 ENCOUNTER — Other Ambulatory Visit: Payer: Self-pay | Admitting: Family Medicine

## 2021-03-08 ENCOUNTER — Other Ambulatory Visit: Payer: Self-pay

## 2021-03-08 NOTE — Telephone Encounter (Signed)
Pt called back to schedule appt for 03/13/21

## 2021-03-08 NOTE — Telephone Encounter (Addendum)
Plz schedule OV for DM f/u.  FYI to Dr. Darnell Level.

## 2021-03-08 NOTE — Telephone Encounter (Signed)
Lvm for pt to call to schedule a f/u apt.

## 2021-03-09 ENCOUNTER — Other Ambulatory Visit: Payer: Self-pay

## 2021-03-09 MED FILL — Atorvastatin Calcium Tab 40 MG (Base Equivalent): ORAL | 90 days supply | Qty: 90 | Fill #0 | Status: AC

## 2021-03-10 MED ORDER — GLIPIZIDE 5 MG PO TABS: 5.0000 mg | ORAL_TABLET | Freq: Every day | ORAL | 0 refills | Status: DC

## 2021-03-10 NOTE — Addendum Note (Signed)
Addended by: Ria Bush on: 03/10/2021 12:47 PM   Modules accepted: Orders

## 2021-03-13 ENCOUNTER — Other Ambulatory Visit: Payer: Self-pay

## 2021-03-13 ENCOUNTER — Ambulatory Visit (INDEPENDENT_AMBULATORY_CARE_PROVIDER_SITE_OTHER): Payer: No Typology Code available for payment source | Admitting: Family Medicine

## 2021-03-13 ENCOUNTER — Encounter: Payer: Self-pay | Admitting: Family Medicine

## 2021-03-13 VITALS — BP 150/88 | HR 88 | Temp 98.6°F | Ht 71.75 in | Wt 215.3 lb

## 2021-03-13 DIAGNOSIS — I10 Essential (primary) hypertension: Secondary | ICD-10-CM | POA: Diagnosis not present

## 2021-03-13 DIAGNOSIS — R351 Nocturia: Secondary | ICD-10-CM

## 2021-03-13 DIAGNOSIS — H938X2 Other specified disorders of left ear: Secondary | ICD-10-CM

## 2021-03-13 DIAGNOSIS — R5381 Other malaise: Secondary | ICD-10-CM

## 2021-03-13 DIAGNOSIS — E1169 Type 2 diabetes mellitus with other specified complication: Secondary | ICD-10-CM | POA: Diagnosis not present

## 2021-03-13 DIAGNOSIS — F439 Reaction to severe stress, unspecified: Secondary | ICD-10-CM

## 2021-03-13 LAB — POC URINALSYSI DIPSTICK (AUTOMATED)
Bilirubin, UA: NEGATIVE
Blood, UA: NEGATIVE
Glucose, UA: NEGATIVE
Leukocytes, UA: NEGATIVE
Nitrite, UA: NEGATIVE
Protein, UA: NEGATIVE
Spec Grav, UA: 1.015 (ref 1.010–1.025)
Urobilinogen, UA: 0.2 E.U./dL
pH, UA: 5.5 (ref 5.0–8.0)

## 2021-03-13 LAB — POCT GLYCOSYLATED HEMOGLOBIN (HGB A1C): Hemoglobin A1C: 7.2 % — AB (ref 4.0–5.6)

## 2021-03-13 MED ORDER — HYDROXYZINE HCL 25 MG PO TABS
12.5000 mg | ORAL_TABLET | Freq: Two times a day (BID) | ORAL | 0 refills | Status: DC | PRN
  Filled 2021-03-13: qty 30, 15d supply, fill #0

## 2021-03-13 MED ORDER — BENAZEPRIL HCL 10 MG PO TABS
10.0000 mg | ORAL_TABLET | Freq: Every day | ORAL | 3 refills | Status: DC
  Filled 2021-03-13: qty 30, 30d supply, fill #0
  Filled 2021-04-12: qty 30, 30d supply, fill #1
  Filled 2021-05-15: qty 30, 30d supply, fill #2

## 2021-03-13 NOTE — Assessment & Plan Note (Addendum)
Saw ENT thought ETD.  Benign exam today.  Continue flonase.

## 2021-03-13 NOTE — Patient Instructions (Addendum)
Labs today, urinalysis today.  May try hydroxyzine 25mg  1/2-1 tablet as needed for stress/anxiety. Start benazepril 10mg  daily in addition to your amlodipine. If tolerated well, we can mix with amlodipine as a combo pill. Return in 2-3 months for physical.

## 2021-03-13 NOTE — Assessment & Plan Note (Addendum)
BP elevation today as well as recent readings at home.  Will add benazepril 10mg  to regimen. Reviewed monitoring for dry cough side effect or angioedema allergy.  Reassess control at upcoming CPE

## 2021-03-13 NOTE — Progress Notes (Addendum)
Patient ID: Gerald Collins, male    DOB: 1966/01/22, 55 y.o.   MRN: [id number]  This visit was conducted in person.  BP (!) 150/88 (BP Location: Right Arm, Cuff Size: Large)   Pulse 88   Temp 98.6 F (37 C) (Temporal)   Ht 5' 11.75" (1.822 m)   Wt 215 lb 5 oz (97.7 kg)   BMI 29.41 kg/m    CC: check BP and sugars Subjective:   HPI: Gerald Collins is a 55 y.o. male presenting on 03/13/2021 for Diabetes (Here for f/u.)   Recently saw ENT for eustachian tube dysfunction - treated with fluticasone/azelastine spray, and short prednisone taper. Had previously also received 40mg  prednisone 3d burst in July for same issue. Notes ongoing intermittent pressure sensation to head.   Since then, noticed elevated sugar readings up to 325. We prescribed glipizide to use PRN while on prednisone - did not try this as off prednisone sugars have improved - today's cbg 140. He has continued exercising.   Lab Results  Component Value Date   HGBA1C 7.2 (A) 03/13/2021   DM - currently managed only with [place] 50mg  daily. Metformin may have caused blurry vision.   HTN - continues amlodipine 5mg  daily.   BP over the weekend 200/100s.  He is stressed because he hasn't been feeling well.  Chills at night, night sweats, weight loss noted (10 lb weight loss over the past 3 months), some early satiety, notes increased nocturia.  Has also noted intermittent abdominal fullness.  No fevers, abd pain, nausea/vomiting, diarrhea/constipation, blood in stool or urine, no rectal pressure or discomfort.   COLONOSCOPY 09/2015 hyperplastic polpy rpt 10 yrs Henrene Pastor) Lab Results  Component Value Date   PSA1 0.2 03/24/2020   PSA1 0.2 11/30/2018   PSA1 0.3 10/08/2017   PSA 0.20 07/17/2015       Relevant past medical, surgical, family and social history reviewed and updated as indicated. Interim medical history since our last visit reviewed. Allergies and medications reviewed and updated. Outpatient Medications  Prior to Visit  Medication Sig Dispense Refill   acetic acid-hydrocortisone (VOSOL-HC) OTIC solution Place 3 drops into the left ear 3 (three) times daily. 10 mL 0   amLODipine (NORVASC) 5 MG tablet TAKE 1 TABLET (5 MG TOTAL) BY MOUTH DAILY. 90 tablet 0   atorvastatin (LIPITOR) 40 MG tablet TAKE 1 TABLET (40 MG TOTAL) BY MOUTH DAILY. 90 tablet 3   azelastine (ASTELIN) 0.1 % nasal spray PLACE 2 SPRAYS INTO BOTH NOSTRILS 2 TIMES DAILY. USE IN EACH NOSTRIL AS DIRECTED 30 mL 3   fluticasone (FLONASE) 50 MCG/ACT nasal spray 2 sprays in each nostril once daily 16 g 12   glipiZIDE (GLUCOTROL) 5 MG tablet Take 1 tablet (5 mg total) by mouth daily before breakfast. While on prednisone 10 tablet 0   sitaGLIPtin (JANUVIA) 50 MG tablet Take by mouth daily. 90 tablet 1   vitamin C (ASCORBIC ACID) 500 MG tablet Take 500 mg by mouth daily.     azelastine (ASTELIN) 0.1 % nasal spray 1-2 sprays in each nostril twice daily 30 mL 12   predniSONE (DELTASONE) 10 MG tablet Sterapred Double Strength 6 day taper. Take as directed. Do not take non-steroidal anti inflammatory medications while on this (ibuprofen) 21 tablet 0   predniSONE (DELTASONE) 20 MG tablet Take 2 tablets (40 mg total) by mouth daily. For 3 days, then 1 tab daily for 3 days. 9 tablet 0   terbinafine (LAMISIL) 250 MG  tablet Take 1 tablet (250 mg total) by mouth daily. 45 tablet 0   No facility-administered medications prior to visit.     Per HPI unless specifically indicated in ROS section below Review of Systems  Objective:  BP (!) 150/88 (BP Location: Right Arm, Cuff Size: Large)   Pulse 88   Temp 98.6 F (37 C) (Temporal)   Ht 5' 11.75" (1.822 m)   Wt 215 lb 5 oz (97.7 kg)   BMI 29.41 kg/m   Wt Readings from Last 3 Encounters:  03/13/21 215 lb 5 oz (97.7 kg)  12/22/20 224 lb (101.6 kg)  11/20/20 230 lb (104.3 kg)      Physical Exam Vitals and nursing note reviewed.  Constitutional:      Appearance: Normal appearance. He is not  ill-appearing.  HENT:     Right Ear: Tympanic membrane, ear canal and external ear normal. There is no impacted cerumen.     Left Ear: Tympanic membrane, ear canal and external ear normal. There is no impacted cerumen.  Neck:     Thyroid: No thyroid mass or thyromegaly.  Cardiovascular:     Rate and Rhythm: Normal rate and regular rhythm.     Pulses: Normal pulses.     Heart sounds: Normal heart sounds. No murmur heard. Pulmonary:     Effort: Pulmonary effort is normal. No respiratory distress.     Breath sounds: Normal breath sounds. No wheezing, rhonchi or rales.  Abdominal:     General: Bowel sounds are normal. There is no distension.     Palpations: Abdomen is soft. There is no mass.     Tenderness: There is no abdominal tenderness. There is no right CVA tenderness, left CVA tenderness, guarding or rebound.     Hernia: No hernia is present.  Musculoskeletal:     Cervical back: Normal range of motion and neck supple. No rigidity.     Right lower leg: No edema.     Left lower leg: No edema.  Lymphadenopathy:     Cervical: No cervical adenopathy.  Skin:    General: Skin is warm and dry.     Findings: No rash.  Neurological:     Mental Status: He is alert.  Psychiatric:        Mood and Affect: Mood normal.        Behavior: Behavior normal.      Results for orders placed or performed in visit on 03/13/21  POCT glycosylated hemoglobin (Hb A1C)  Result Value Ref Range   Hemoglobin A1C 7.2 (A) 4.0 - 5.6 %   HbA1c POC (<> result, manual entry)     HbA1c, POC (prediabetic range)     HbA1c, POC (controlled diabetic range)    POCT Urinalysis Dipstick (Automated)  Result Value Ref Range   Color, UA yellow    Clarity, UA clear    Glucose, UA Negative Negative   Bilirubin, UA negative    Ketones, UA 1+    Spec Grav, UA 1.015 1.010 - 1.025   Blood, UA negative    pH, UA 5.5 5.0 - 8.0   Protein, UA Negative Negative   Urobilinogen, UA 0.2 0.2 or 1.0 E.U./dL   Nitrite, UA  negative    Leukocytes, UA Negative Negative    Assessment & Plan:  This visit occurred during the SARS-CoV-2 public health emergency.  Safety protocols were in place, including screening questions prior to the visit, additional usage of staff PPE, and extensive cleaning of exam room  while observing appropriate contact time as indicated for disinfecting solutions.   Problem List Items Addressed This Visit     Type 2 diabetes mellitus with other specified complication ([place])    Chronic, overall adequate control despite recent prednisone courses. Continue [place]. Did not take glipizide.       Relevant Medications   benazepril (LOTENSIN) 10 MG tablet   Other Relevant Orders   POCT glycosylated hemoglobin (Hb A1C) (Completed)   Essential hypertension    BP elevation today as well as recent readings at home.  Will add benazepril 10mg  to regimen. Reviewed monitoring for dry cough side effect or angioedema allergy.  Reassess control at upcoming CPE      Relevant Medications   benazepril (LOTENSIN) 10 MG tablet   Other Relevant Orders   POCT Urinalysis Dipstick (Automated) (Completed)   Ear pressure, left    Saw ENT thought ETD.  Benign exam today.  Continue flonase.       Malaise - Primary    General malaise over last several weeks associated with chills, sweats, unintentional weight loss, early satiety, abdominal bloating, and increased nocturia. However no fevers, pain, bowel changes. No significant GERD symptoms or dysphagia. Will start evaluation with labwork and urinalysis.  He is UTD colon and prostate cancer screening. I did ask him to return in short interim for physical.  In interim, did offer PRN hydroxyzine for stress/anxiety, reviewed sedation precautions.      Relevant Orders   CBC with Differential/Platelet   Comprehensive metabolic panel   Sedimentation rate   TSH   Other Visit Diagnoses     Nocturia       Relevant Orders   PSA   Stress            Meds  ordered this encounter  Medications   hydrOXYzine (ATARAX/VISTARIL) 25 MG tablet    Sig: Take 0.5-1 tablets (12.5-25 mg total) by mouth 2 (two) times daily as needed for anxiety.    Dispense:  30 tablet    Refill:  0   benazepril (LOTENSIN) 10 MG tablet    Sig: Take 1 tablet (10 mg total) by mouth daily.    Dispense:  30 tablet    Refill:  3    In addition to amlodipine    Orders Placed This Encounter  Procedures   CBC with Differential/Platelet   Comprehensive metabolic panel   PSA   Sedimentation rate   TSH   POCT glycosylated hemoglobin (Hb A1C)   POCT Urinalysis Dipstick (Automated)     Patient Instructions  Labs today, urinalysis today.  May try hydroxyzine 25mg  1/2-1 tablet as needed for stress/anxiety. Start benazepril 10mg  daily in addition to your amlodipine. If tolerated well, we can mix with amlodipine as a combo pill. Return in 2-3 months for physical.   Follow up plan: Return in about 2 months (around 05/13/2021) for annual exam, prior fasting for blood work.  Ria Bush, MD

## 2021-03-14 LAB — COMPREHENSIVE METABOLIC PANEL
ALT: 22 IU/L (ref 0–44)
AST: 23 IU/L (ref 0–40)
Albumin/Globulin Ratio: 2.4 — ABNORMAL HIGH (ref 1.2–2.2)
Albumin: 5.2 g/dL — ABNORMAL HIGH (ref 3.8–4.9)
Alkaline Phosphatase: 70 IU/L (ref 44–121)
BUN/Creatinine Ratio: 9 (ref 9–20)
BUN: 11 mg/dL (ref 6–24)
Bilirubin Total: 0.7 mg/dL (ref 0.0–1.2)
CO2: 19 mmol/L — ABNORMAL LOW (ref 20–29)
Calcium: 9.9 mg/dL (ref 8.7–10.2)
Chloride: 99 mmol/L (ref 96–106)
Creatinine, Ser: 1.29 mg/dL — ABNORMAL HIGH (ref 0.76–1.27)
Globulin, Total: 2.2 g/dL (ref 1.5–4.5)
Glucose: 120 mg/dL — ABNORMAL HIGH (ref 70–99)
Potassium: 4.4 mmol/L (ref 3.5–5.2)
Sodium: 138 mmol/L (ref 134–144)
Total Protein: 7.4 g/dL (ref 6.0–8.5)
eGFR: 65 mL/min/{1.73_m2} (ref >59)

## 2021-03-14 LAB — CBC WITH DIFFERENTIAL/PLATELET
Basophils Absolute: 0 10*3/uL (ref 0.0–0.2)
Basos: 1 %
EOS (ABSOLUTE): 0 10*3/uL (ref 0.0–0.4)
Eos: 0 %
Hematocrit: 43.9 % (ref 37.5–51.0)
Hemoglobin: 14.8 g/dL (ref 13.0–17.7)
Immature Grans (Abs): 0 10*3/uL (ref 0.0–0.1)
Immature Granulocytes: 0 %
Lymphocytes Absolute: 1.2 10*3/uL (ref 0.7–3.1)
Lymphs: 33 %
MCH: 27.9 pg (ref 26.6–33.0)
MCHC: 33.7 g/dL (ref 31.5–35.7)
MCV: 83 fL (ref 79–97)
Monocytes Absolute: 0.4 10*3/uL (ref 0.1–0.9)
Monocytes: 10 %
Neutrophils Absolute: 2.1 10*3/uL (ref 1.4–7.0)
Neutrophils: 56 %
Platelets: 185 10*3/uL (ref 150–450)
RBC: 5.31 x10E6/uL (ref 4.14–5.80)
RDW: 12.7 % (ref 11.6–15.4)
WBC: 3.7 10*3/uL (ref 3.4–10.8)

## 2021-03-14 LAB — PSA: Prostate Specific Ag, Serum: 0.2 ng/mL (ref 0.0–4.0)

## 2021-03-14 LAB — TSH: TSH: 1.18 u[IU]/mL (ref 0.450–4.500)

## 2021-03-14 LAB — SEDIMENTATION RATE: Sed Rate: 7 mm/hr (ref 0–30)

## 2021-03-14 NOTE — Assessment & Plan Note (Signed)
Chronic, overall adequate control despite recent prednisone courses. Continue [place]. Did not take glipizide.

## 2021-03-14 NOTE — Assessment & Plan Note (Addendum)
General malaise over last several weeks associated with chills, sweats, unintentional weight loss, early satiety, abdominal bloating, and increased nocturia. However no fevers, pain, bowel changes. No significant GERD symptoms or dysphagia. Will start evaluation with labwork and urinalysis.  He is UTD colon and prostate cancer screening. I did ask him to return in short interim for physical.  In interim, did offer PRN hydroxyzine for stress/anxiety, reviewed sedation precautions.

## 2021-03-22 ENCOUNTER — Other Ambulatory Visit: Payer: Self-pay

## 2021-04-13 ENCOUNTER — Other Ambulatory Visit: Payer: Self-pay

## 2021-04-19 ENCOUNTER — Other Ambulatory Visit: Payer: Self-pay

## 2021-04-19 ENCOUNTER — Other Ambulatory Visit: Payer: Self-pay | Admitting: Family Medicine

## 2021-04-19 MED ORDER — AMLODIPINE BESYLATE 5 MG PO TABS
ORAL_TABLET | Freq: Every day | ORAL | 0 refills | Status: DC
  Filled 2021-04-19: qty 90, 90d supply, fill #0

## 2021-04-19 MED FILL — Azelastine HCl Nasal Spray 0.1% (137 MCG/SPRAY): NASAL | 30 days supply | Qty: 30 | Fill #0 | Status: AC

## 2021-04-30 ENCOUNTER — Encounter: Payer: Self-pay | Admitting: Family Medicine

## 2021-04-30 ENCOUNTER — Telehealth (INDEPENDENT_AMBULATORY_CARE_PROVIDER_SITE_OTHER): Payer: No Typology Code available for payment source | Admitting: Family Medicine

## 2021-04-30 ENCOUNTER — Other Ambulatory Visit: Payer: Self-pay

## 2021-04-30 VITALS — BP 123/80 | HR 91 | Temp 98.5°F | Ht 71.75 in

## 2021-04-30 DIAGNOSIS — E1169 Type 2 diabetes mellitus with other specified complication: Secondary | ICD-10-CM | POA: Diagnosis not present

## 2021-04-30 DIAGNOSIS — U071 COVID-19: Secondary | ICD-10-CM | POA: Diagnosis not present

## 2021-04-30 MED ORDER — FREESTYLE LITE TEST VI STRP
ORAL_STRIP | 0 refills | Status: DC
  Filled 2021-04-30: qty 100, 25d supply, fill #0
  Filled 2021-09-14: qty 100, 25d supply, fill #1

## 2021-04-30 MED ORDER — FREESTYLE LANCETS MISC
0 refills | Status: DC
  Filled 2021-04-30: qty 100, 25d supply, fill #0

## 2021-04-30 MED ORDER — BLOOD GLUCOSE MONITOR KIT
PACK | 0 refills | Status: DC
  Filled 2021-04-30: qty 1, 30d supply, fill #0

## 2021-04-30 MED ORDER — MOLNUPIRAVIR EUA 200MG CAPSULE
4.0000 | ORAL_CAPSULE | Freq: Two times a day (BID) | ORAL | 0 refills | Status: AC
  Filled 2021-04-30: qty 40, 5d supply, fill #0

## 2021-04-30 NOTE — Progress Notes (Signed)
Gerald Snell T. Winn Muehl, MD Primary Care and Sports Medicine [facility] [place], [address] Phone: [phone]  FAX: [phone]  Gerald Collins - 55 y.o. male  MRN [id number]  Date of Birth: [birth year]  Visit Date: 04/30/2021  PCP: Ria Bush, MD  Referred by: Ria Bush, MD  Virtual Visit via Video Note:  I connected with  Gerald Collins on 04/30/2021  2:20 PM EST by a video enabled telemedicine application and verified that I am speaking with the correct person using two identifiers.   Location patient: home computer, tablet, or smartphone Location provider: work or home office Consent: Verbal consent directly obtained from Gerald Collins. Persons participating in the virtual visit: patient, provider  I discussed the limitations of evaluation and management by telemedicine and the availability of in person appointments. The patient expressed understanding and agreed to proceed.  Chief Complaint  Patient presents with   Covid Positive    [phone] home test this morning-Symptoms started on Sunday morning   Generalized Body Aches   Fatigue        Cough   Urinary Frequency    History of Present Illness:  Body aches, fatigue, coughing a lot.  Now test came back positive.  Symptoms started yesterday, and his initial test was negative.  Generally he feels terrible.  Urinating a lot.  Not wanting to eat. Coughing.  Soup?  He thinks that he might be able to eat this.  No change in smell or taste. + nausea   Review of Systems as above: See pertinent positives and pertinent negatives per HPI No acute distress verbally   Observations/Objective/Exam:  An attempt was made to discern vital signs over the phone and per patient if applicable and possible.   General:    Alert, Oriented, appears well and in no acute distress  Pulmonary:     On inspection no signs of respiratory  distress.  Psych / Neurological:     Pleasant and cooperative.  Assessment and Plan:    ICD-10-CM   1. COVID-19  U07.1     2. Type 2 diabetes mellitus with other specified complication, without long-term current use of insulin (HCC)  E11.69 blood glucose meter kit and supplies KIT     Confirmed COVID.  Generally feeling unwell as above.  Reviewed isolation, supportive care.  I am getting placed the patient on some antivirals given his risk factors.  He also asked me to send in a new glucometer, so I will send in a generic request to the [facility].  I discussed the assessment and treatment plan with the patient. The patient was provided an opportunity to ask questions and all were answered. The patient agreed with the plan and demonstrated an understanding of the instructions.   The patient was advised to call back or seek an in-person evaluation if the symptoms worsen or if the condition fails to improve as anticipated.  Follow-up: prn unless noted otherwise below No follow-ups on file.  Meds ordered this encounter  Medications   molnupiravir EUA (LAGEVRIO) 200 mg CAPS capsule    Sig: Take 4 capsules (800 mg total) by mouth 2 (two) times daily for 5 days.    Dispense:  40 capsule    Refill:  0   blood glucose meter kit and supplies KIT    Sig: Dispense based on patient and insurance preference. Use up to four times daily as directed.  Dispense:  1 each    Refill:  0    Generic    Order Specific Question:   Number of strips    Answer:   100    Order Specific Question:   Number of lancets    Answer:   100   No orders of the defined types were placed in this encounter.   Signed,  Maud Deed. Eleora Sutherland, MD

## 2021-05-12 IMAGING — DX RIGHT SHOULDER - 2+ VIEW
3 series · 3 of 3 positions shown · non-contrast
Comparison: None.

CLINICAL DATA: Fall

EXAM:
RIGHT SHOULDER - 2+ VIEW

[shoulder ap (1 of 2)]
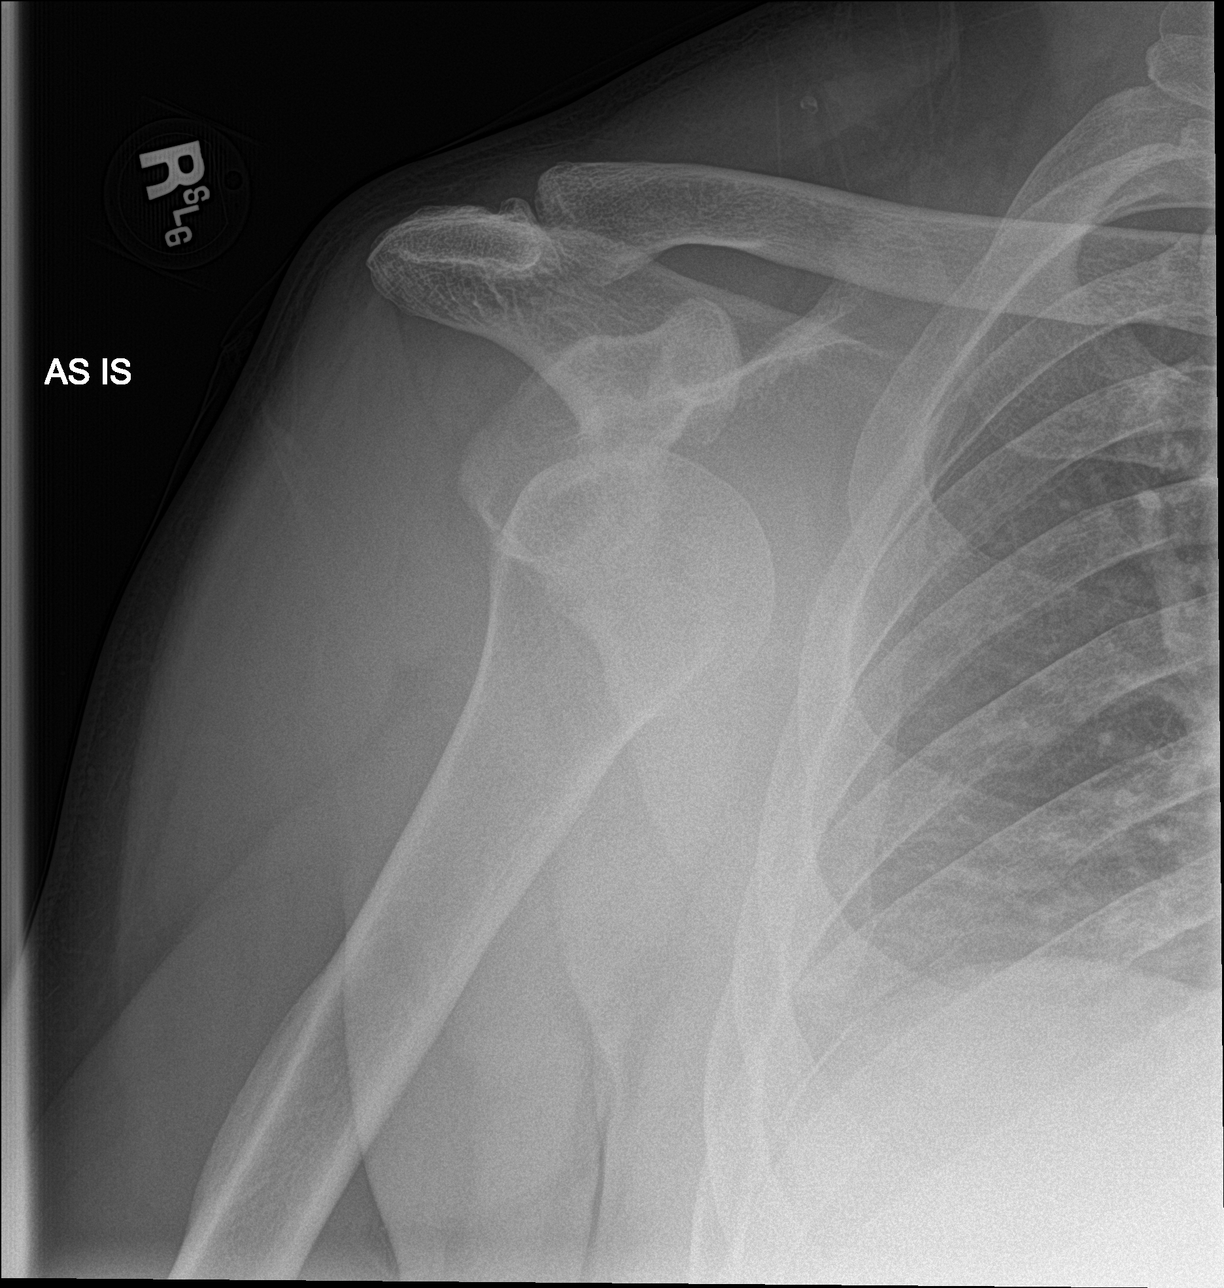

[shoulder obl]
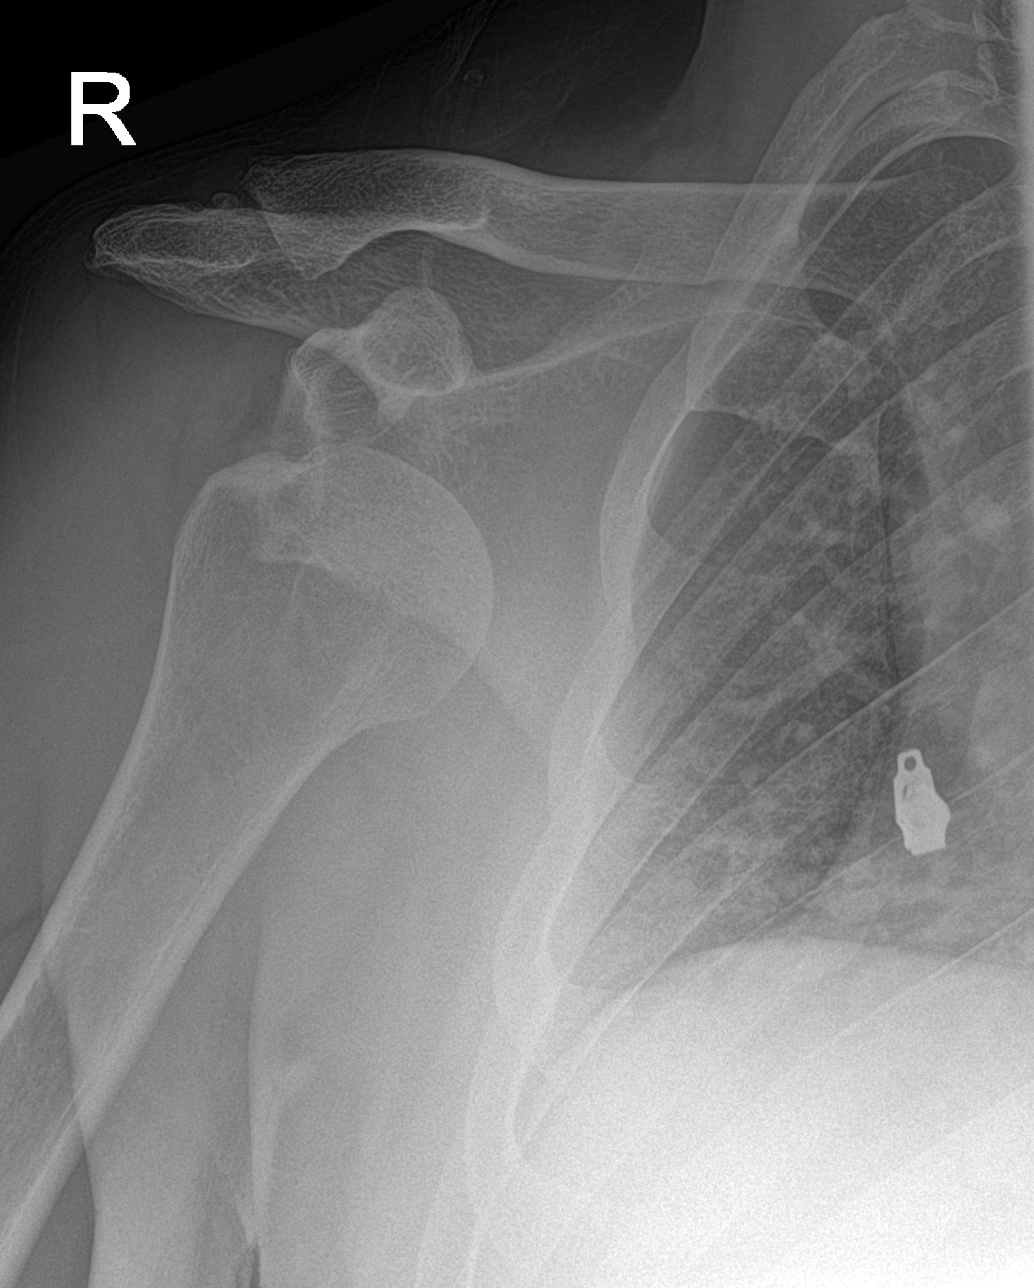

[shoulder ap (2 of 2)]
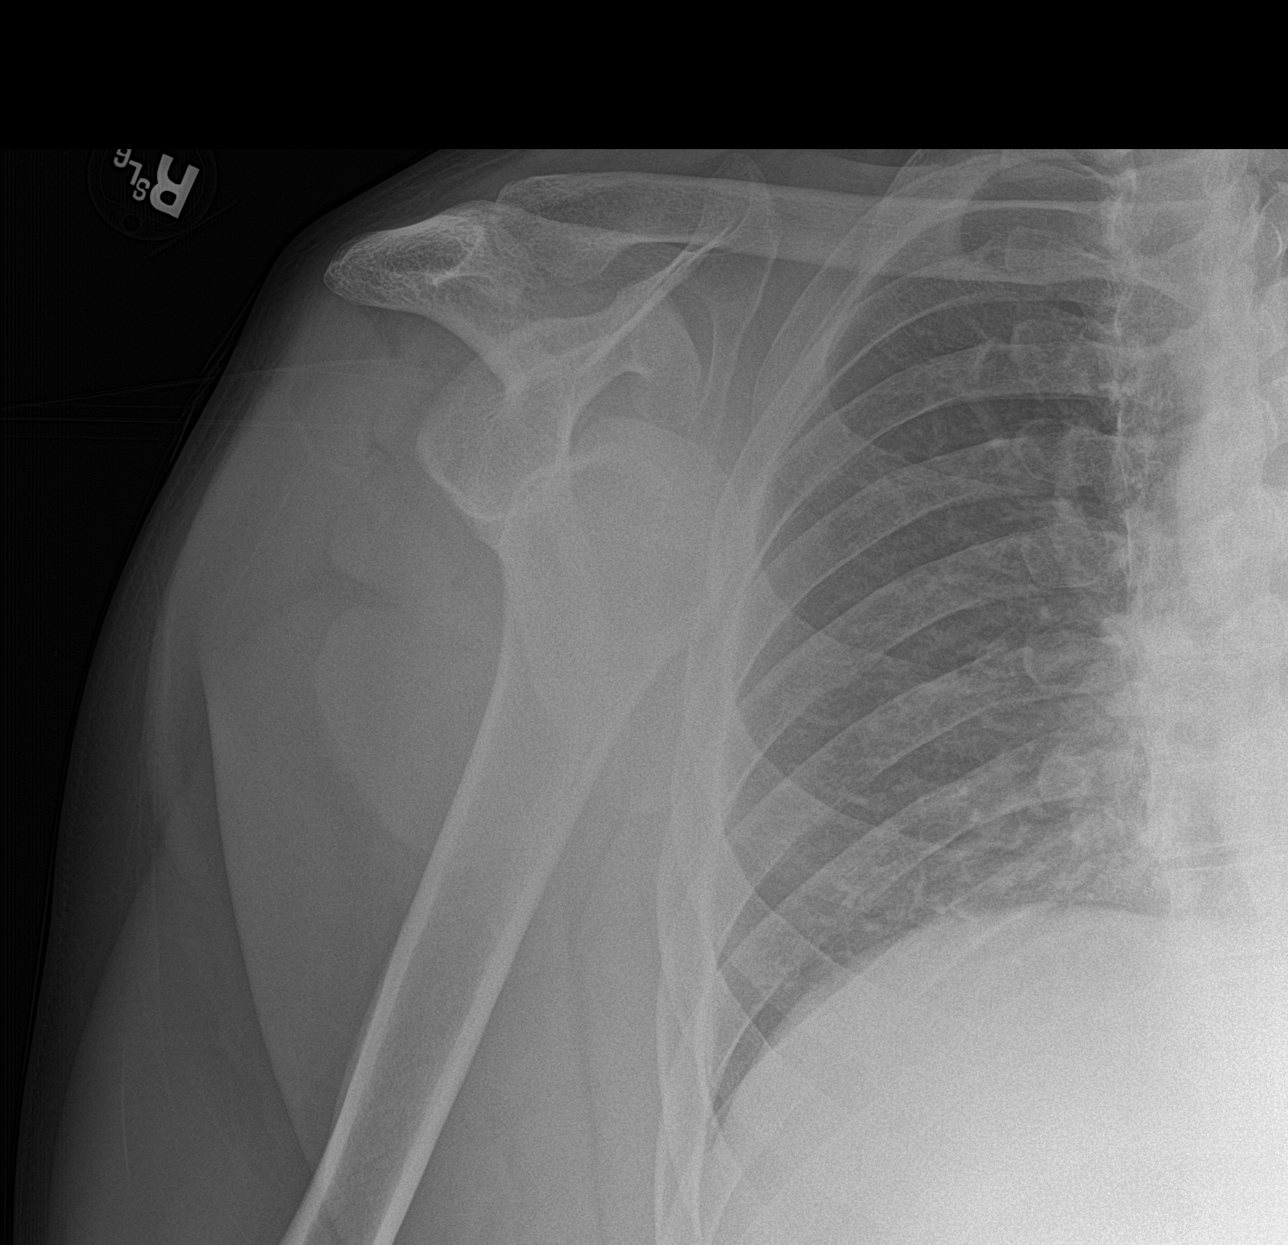

[3 of 3 positions shown; findings below may reference images not displayed]

FINDINGS: Anterior dislocation of the right humeral head at the glenohumeral
joint. No evidence of acromioclavicular separation. No fracture. No
suspicious focal osseous lesion. Mild AC joint osteoarthritis. No
radiopaque foreign body.
IMPRESSION: Anterior right shoulder dislocation. No evidence of fracture.
Postreduction films recommended.

Mild AC joint osteoarthritis.

## 2021-05-12 IMAGING — DX PORTABLE RIGHT SHOULDER
3 series · 3 of 3 positions shown · non-contrast
Comparison: RIGHT shoulder x-rays earlier same day at [DATE] p.m.

CLINICAL DATA: Post reduction RIGHT shoulder dislocation.

EXAM:
PORTABLE RIGHT SHOULDER 3  VIEWS [DATE] p.m.:

[shoulder ap (1 of 2)]
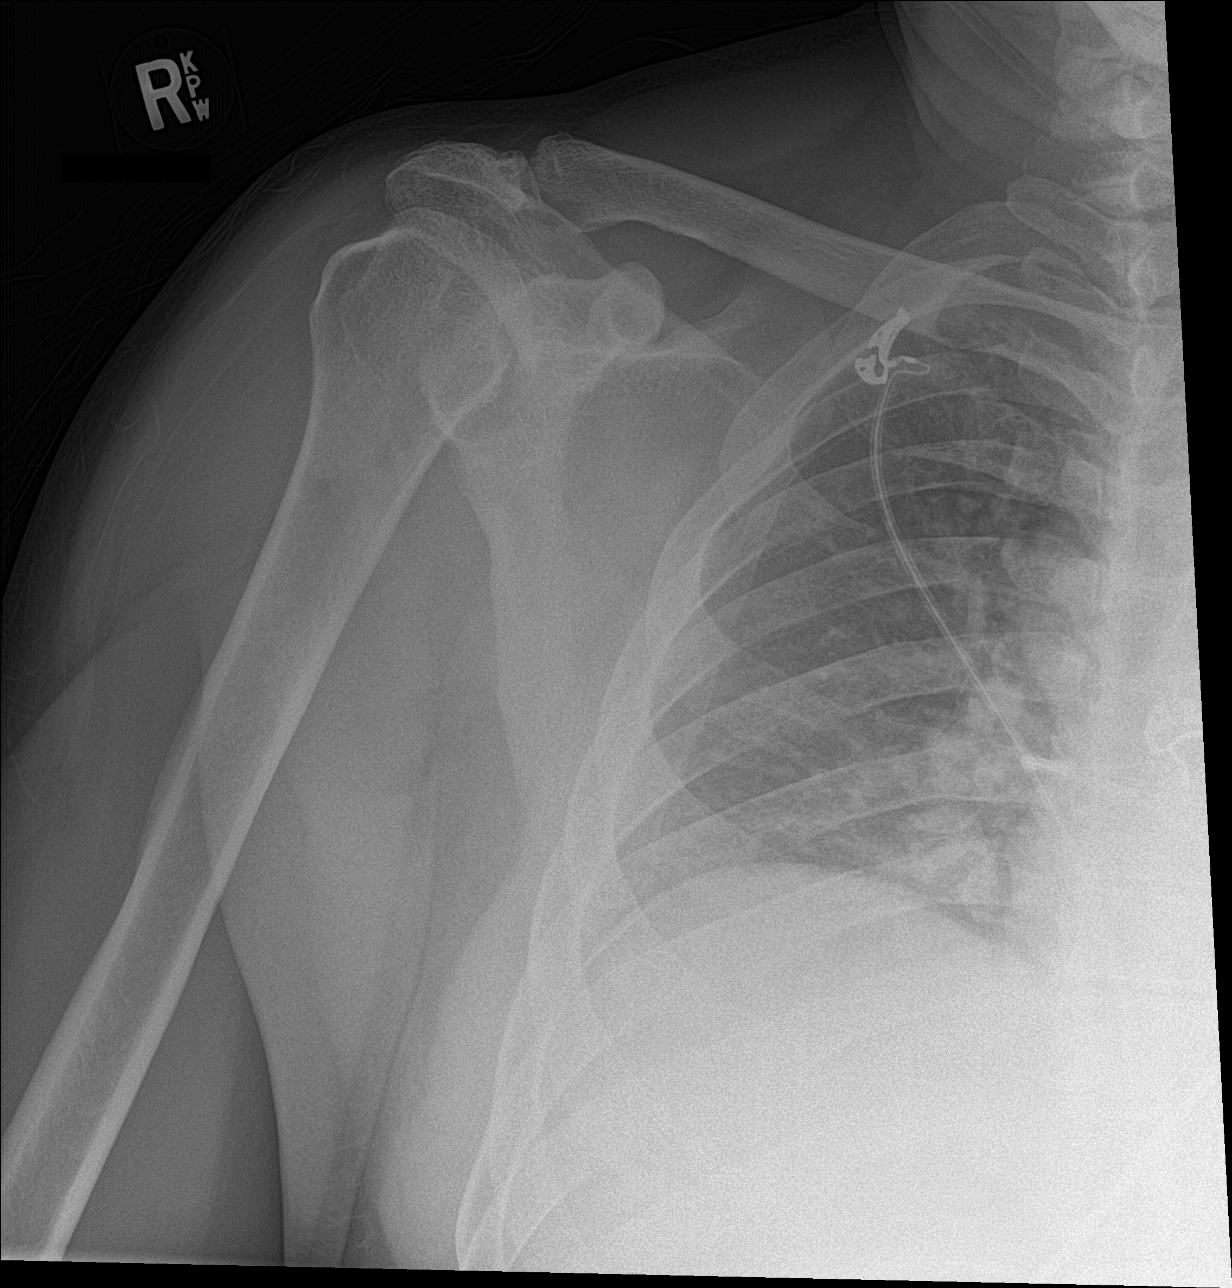

[shoulder obl]
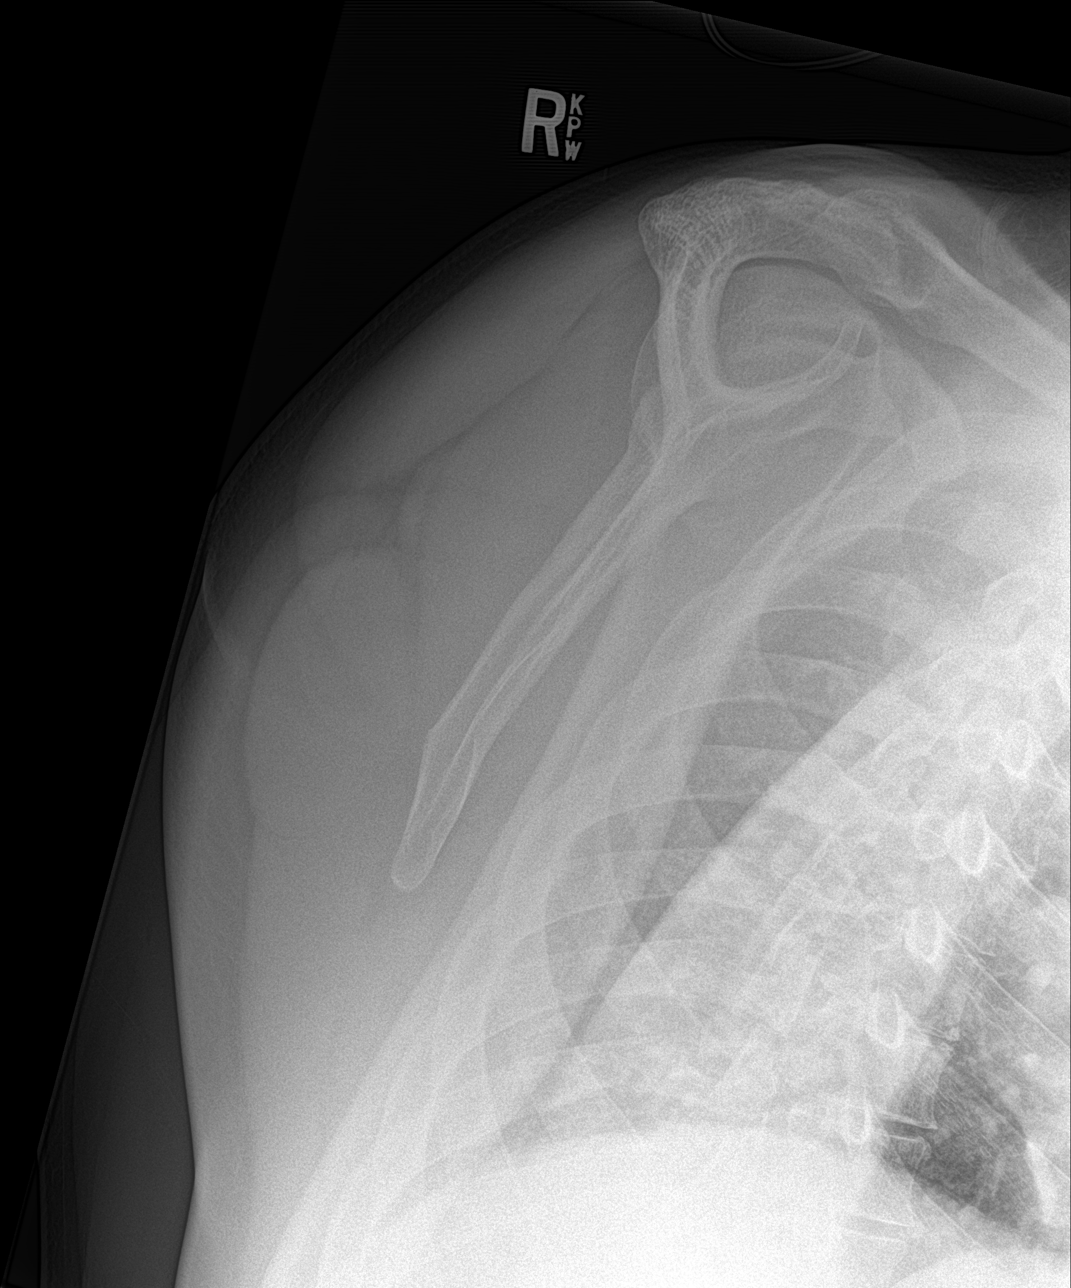

[shoulder ap (2 of 2)]
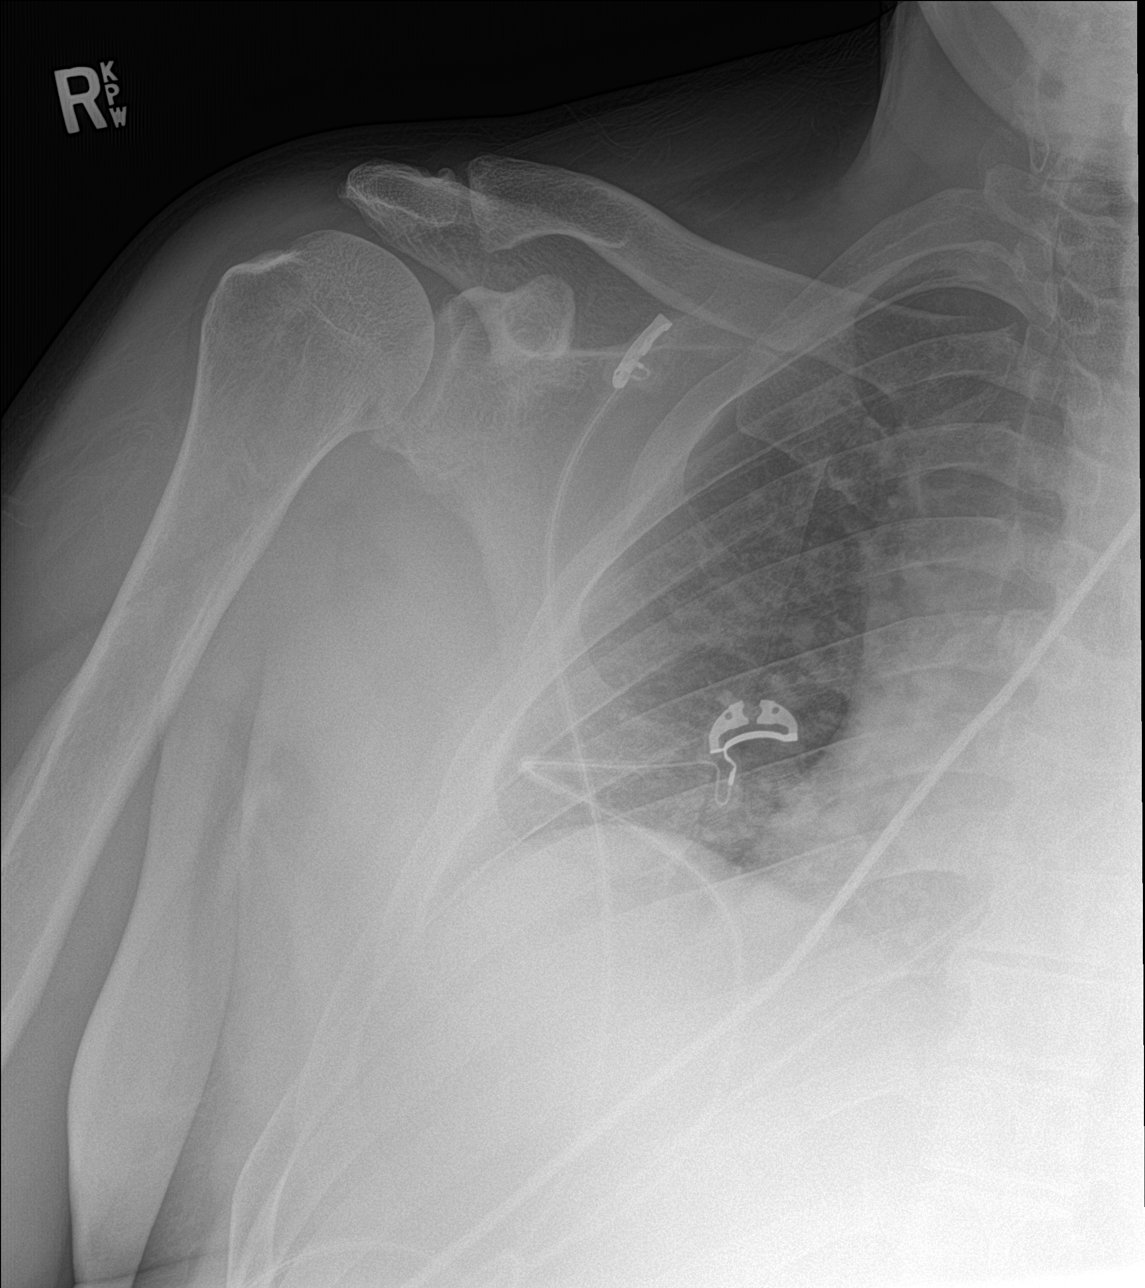

[3 of 3 positions shown; findings below may reference images not displayed]

FINDINGS: Anatomic alignment of the glenohumeral joint post reduction.
Hill-Sachs deformity noted on the LATERAL aspect of the humeral
head.
IMPRESSION: Anatomic alignment of the glenohumeral joint post reduction.
Hill-Sachs deformity.

## 2021-05-15 ENCOUNTER — Other Ambulatory Visit: Payer: Self-pay

## 2021-05-22 ENCOUNTER — Ambulatory Visit (INDEPENDENT_AMBULATORY_CARE_PROVIDER_SITE_OTHER): Payer: No Typology Code available for payment source | Admitting: Family Medicine

## 2021-05-22 ENCOUNTER — Other Ambulatory Visit: Payer: Self-pay

## 2021-05-22 ENCOUNTER — Encounter: Payer: Self-pay | Admitting: Family Medicine

## 2021-05-22 VITALS — BP 120/62 | HR 73 | Temp 97.5°F | Ht 70.75 in | Wt 219.5 lb

## 2021-05-22 DIAGNOSIS — E1169 Type 2 diabetes mellitus with other specified complication: Secondary | ICD-10-CM

## 2021-05-22 DIAGNOSIS — Z0001 Encounter for general adult medical examination with abnormal findings: Secondary | ICD-10-CM | POA: Diagnosis not present

## 2021-05-22 DIAGNOSIS — I10 Essential (primary) hypertension: Secondary | ICD-10-CM

## 2021-05-22 DIAGNOSIS — H938X2 Other specified disorders of left ear: Secondary | ICD-10-CM

## 2021-05-22 DIAGNOSIS — E785 Hyperlipidemia, unspecified: Secondary | ICD-10-CM

## 2021-05-22 DIAGNOSIS — E669 Obesity, unspecified: Secondary | ICD-10-CM

## 2021-05-22 MED ORDER — ATORVASTATIN CALCIUM 40 MG PO TABS
ORAL_TABLET | Freq: Every day | ORAL | 3 refills | Status: DC
Start: 2021-05-22 — End: 2022-06-18
  Filled 2021-05-22: qty 90, fill #0
  Filled 2021-06-25: qty 90, 90d supply, fill #0
  Filled 2021-09-26: qty 90, 90d supply, fill #1
  Filled 2021-12-27: qty 90, 90d supply, fill #2
  Filled 2022-03-25: qty 90, 90d supply, fill #3

## 2021-05-22 MED ORDER — AMLODIPINE BESYLATE 5 MG PO TABS
ORAL_TABLET | Freq: Every day | ORAL | 3 refills | Status: DC
  Filled 2021-05-22: qty 90, fill #0
  Filled 2021-07-20: qty 90, 90d supply, fill #0
  Filled 2021-10-18: qty 90, 90d supply, fill #1
  Filled 2021-12-27: qty 90, 90d supply, fill #2

## 2021-05-22 MED ORDER — SITAGLIPTIN PHOSPHATE 50 MG PO TABS
50.0000 mg | ORAL_TABLET | Freq: Every day | ORAL | 3 refills | Status: DC
  Filled 2021-05-22: qty 30, 30d supply, fill #0
  Filled 2021-06-25: qty 30, 30d supply, fill #1
  Filled 2021-07-20: qty 30, 30d supply, fill #2
  Filled 2021-08-27: qty 30, 30d supply, fill #3
  Filled 2021-10-18: qty 30, 30d supply, fill #4

## 2021-05-22 MED ORDER — LOSARTAN POTASSIUM 25 MG PO TABS
25.0000 mg | ORAL_TABLET | Freq: Every day | ORAL | 3 refills | Status: DC
  Filled 2021-05-22: qty 90, 90d supply, fill #0
  Filled 2021-08-16: qty 90, 90d supply, fill #1

## 2021-05-22 NOTE — Assessment & Plan Note (Signed)
Ongoing. Saw ENT, rec continue fluticasone and azelastine.

## 2021-05-22 NOTE — Progress Notes (Signed)
Patient ID: Gerald Collins, male    DOB: 08-10-1965, 55 y.o.   MRN: [id number]  This visit was conducted in person.  BP 120/62    Pulse 73    Temp (!) 97.5 F (36.4 C) (Temporal)    Ht 5' 10.75" (1.797 m)    Wt 219 lb 8 oz (99.6 kg)    SpO2 97%    BMI 30.83 kg/m    CC: CPE Subjective:   HPI: Gerald Collins is a 55 y.o. male presenting on 05/22/2021 for Annual Exam   COVID infection last month - treated with molnupiravir with significant improvement.   DM - continues januvia 75m daily, metformin may have caused blurry vision.  HTN - benazepril 175madded to amlodipine 2 months ago - with significant improvement in blood pressure control. Notes new ED trouble with ACEI. Some anxiety over recent high blood pressures.   Preventative: COLONOSCOPY 09/2015 hyperplastic polpy rpt 10 yrs (PHenrene PastorProstate cancer - no fmhx. Asxs. Continue yearly PSA.  Lung cancer screen - not eligible  Flu - doesn't receive given bad reaction  COVID vaccine - Pfizer 08/2019, 09/2019 Pneumovax 03/2020 Td 2012, Tdap 2018  Shingrix - discussed, declines but will consider  Seat belt use discussed  Sunscreen use discussed. No changing moles on skin.  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 Interim medical history since our last visit reviewed. Allergies and medications reviewed and updated. Outpatient Medications Prior to Visit  Medication Sig Dispense Refill   azelastine (ASTELIN) 0.1 % nasal spray PLACE 2 SPRAYS INTO BOTH NOSTRILS 2 TIMES DAILY. USE IN EACH NOSTRIL AS DIRECTED 30 mL 3   blood glucose meter kit and supplies KIT  Dispense based on patient and insurance preference. Use up to four times daily as directed. 1 each 0   fluticasone (FLONASE) 50 MCG/ACT nasal spray 2 sprays in each nostril once daily 16 g 12   glucose blood (FREESTYLE LITE) test strip Use up to 4 times daily 100 each 0   hydrOXYzine (ATARAX/VISTARIL) 25 MG tablet Take 0.5-1 tablets (12.5-25 mg total) by mouth 2 (two) times daily as needed for anxiety. 30 tablet 0   Lancets (FREESTYLE) lancets Use up to 4 times daily 100 each 0   vitamin C (ASCORBIC ACID) 500 MG tablet Take 500 mg by mouth daily.     amLODipine (NORVASC) 5 MG tablet TAKE 1 TABLET (5 MG TOTAL) BY MOUTH DAILY. 90 tablet 0   atorvastatin (LIPITOR) 40 MG tablet TAKE 1 TABLET (40 MG TOTAL) BY MOUTH DAILY. 90 tablet 3   benazepril (LOTENSIN) 10 MG tablet Take 1 tablet (10 mg total) by mouth daily. 30 tablet 3   glipiZIDE (GLUCOTROL) 5 MG tablet Take 1 tablet (5 mg total) by mouth daily before breakfast. While on prednisone 10 tablet 0   sitaGLIPtin (JANUVIA) 50 MG tablet Take by mouth daily. 90 tablet 1   No facility-administered medications prior to visit.     Per HPI unless specifically indicated in ROS section below Review of Systems  Constitutional:  Negative for activity change, appetite change, chills, fatigue,  fever and unexpected weight change.  HENT:  Positive for sinus pressure (ongoing s/p ENT eval on fluticasone and azelastine). Negative for hearing loss.   Eyes:  Negative for visual disturbance.  Respiratory:  Positive for cough (recent COVID). Negative for chest tightness, shortness of breath and wheezing.   Cardiovascular:  Negative for chest pain, palpitations and leg swelling.  Gastrointestinal:  Negative for abdominal distention, abdominal pain, blood in stool, constipation, diarrhea, nausea and vomiting.  Genitourinary:  Negative for difficulty urinating and hematuria.  Musculoskeletal:  Negative for arthralgias, myalgias and neck pain.  Skin:  Negative for  rash.  Neurological:  Negative for dizziness, seizures, syncope and headaches.  Hematological:  Negative for adenopathy. Does not bruise/bleed easily.  Psychiatric/Behavioral:  Negative for dysphoric mood. The patient is not nervous/anxious.    Objective:  BP 120/62    Pulse 73    Temp (!) 97.5 F (36.4 C) (Temporal)    Ht 5' 10.75" (1.797 m)    Wt 219 lb 8 oz (99.6 kg)    SpO2 97%    BMI 30.83 kg/m   Wt Readings from Last 3 Encounters:  05/22/21 219 lb 8 oz (99.6 kg)  03/13/21 215 lb 5 oz (97.7 kg)  12/22/20 224 lb (101.6 kg)      Physical Exam Vitals and nursing note reviewed.  Constitutional:      General: He is not in acute distress.    Appearance: Normal appearance. He is well-developed. He is not ill-appearing.  HENT:     Head: Normocephalic and atraumatic.     Right Ear: Hearing, tympanic membrane, ear canal and external ear normal.     Left Ear: Hearing, tympanic membrane, ear canal and external ear normal.  Eyes:     General: No scleral icterus.    Extraocular Movements: Extraocular movements intact.     Conjunctiva/sclera: Conjunctivae normal.     Pupils: Pupils are equal, round, and reactive to light.  Neck:     Thyroid: No thyroid mass or thyromegaly.  Cardiovascular:     Rate and Rhythm: Normal rate and regular rhythm.     Pulses: Normal pulses.          Radial pulses are 2+ on the right side and 2+ on the left side.     Heart sounds: Normal heart sounds. No murmur heard. Pulmonary:     Effort: Pulmonary effort is normal. No respiratory distress.     Breath sounds: Normal breath sounds. No wheezing, rhonchi or rales.  Abdominal:     General: Bowel sounds are normal. There is no distension.     Palpations: Abdomen is soft. There is no mass.     Tenderness: There is no abdominal tenderness. There is no guarding or rebound.     Hernia: No hernia is present.  Musculoskeletal:        General: Normal range of motion.     Cervical back: Normal range of motion and  neck supple.     Right lower leg: No edema.     Left lower leg: No edema.  Lymphadenopathy:     Cervical: No cervical adenopathy.  Skin:    General: Skin is warm and dry.     Findings: No rash.  Neurological:     General: No focal deficit present.     Mental Status: He is alert and oriented to person, place, and time.  Psychiatric:        Mood and Affect: Mood normal.  Behavior: Behavior normal.        Thought Content: Thought content normal.        Judgment: Judgment normal.      Results for orders placed or performed in visit on 03/13/21  CBC with Differential/Platelet  Result Value Ref Range   WBC 3.7 3.4 - 10.8 x10E3/uL   RBC 5.31 4.14 - 5.80 x10E6/uL   Hemoglobin 14.8 13.0 - 17.7 g/dL   Hematocrit 43.9 37.5 - 51.0 %   MCV 83 79 - 97 fL   MCH 27.9 26.6 - 33.0 pg   MCHC 33.7 31.5 - 35.7 g/dL   RDW 12.7 11.6 - 15.4 %   Platelets 185 150 - 450 x10E3/uL   Neutrophils 56 Not Estab. %   Lymphs 33 Not Estab. %   Monocytes 10 Not Estab. %   Eos 0 Not Estab. %   Basos 1 Not Estab. %   Neutrophils Absolute 2.1 1.4 - 7.0 x10E3/uL   Lymphocytes Absolute 1.2 0.7 - 3.1 x10E3/uL   Monocytes Absolute 0.4 0.1 - 0.9 x10E3/uL   EOS (ABSOLUTE) 0.0 0.0 - 0.4 x10E3/uL   Basophils Absolute 0.0 0.0 - 0.2 x10E3/uL   Immature Granulocytes 0 Not Estab. %   Immature Grans (Abs) 0.0 0.0 - 0.1 x10E3/uL  Comprehensive metabolic panel  Result Value Ref Range   Glucose 120 (H) 70 - 99 mg/dL   BUN 11 6 - 24 mg/dL   Creatinine, Ser 1.29 (H) 0.76 - 1.27 mg/dL   eGFR 65 >59 mL/min/1.73   BUN/Creatinine Ratio 9 9 - 20   Sodium 138 134 - 144 mmol/L   Potassium 4.4 3.5 - 5.2 mmol/L   Chloride 99 96 - 106 mmol/L   CO2 19 (L) 20 - 29 mmol/L   Calcium 9.9 8.7 - 10.2 mg/dL   Total Protein 7.4 6.0 - 8.5 g/dL   Albumin 5.2 (H) 3.8 - 4.9 g/dL   Globulin, Total 2.2 1.5 - 4.5 g/dL   Albumin/Globulin Ratio 2.4 (H) 1.2 - 2.2   Bilirubin Total 0.7 0.0 - 1.2 mg/dL   Alkaline Phosphatase 70 44 - 121  IU/L   AST 23 0 - 40 IU/L   ALT 22 0 - 44 IU/L  PSA  Result Value Ref Range   Prostate Specific Ag, Serum 0.2 0.0 - 4.0 ng/mL  Sedimentation rate  Result Value Ref Range   Sed Rate 7 0 - 30 mm/hr  TSH  Result Value Ref Range   TSH 1.180 0.450 - 4.500 uIU/mL  POCT glycosylated hemoglobin (Hb A1C)  Result Value Ref Range   Hemoglobin A1C 7.2 (A) 4.0 - 5.6 %   HbA1c POC (<> result, manual entry)     HbA1c, POC (prediabetic range)     HbA1c, POC (controlled diabetic range)    POCT Urinalysis Dipstick (Automated)  Result Value Ref Range   Color, UA yellow    Clarity, UA clear    Glucose, UA Negative Negative   Bilirubin, UA negative    Ketones, UA 1+    Spec Grav, UA 1.015 1.010 - 1.025   Blood, UA negative    pH, UA 5.5 5.0 - 8.0   Protein, UA Negative Negative   Urobilinogen, UA 0.2 0.2 or 1.0 E.U./dL   Nitrite, UA negative    Leukocytes, UA Negative Negative    Assessment & Plan:  This visit occurred during the SARS-CoV-2 public health emergency.  Safety protocols were in place, including screening questions prior to the visit, additional usage  of staff PPE, and extensive cleaning of exam room while observing appropriate contact time as indicated for disinfecting solutions.   Problem List Items Addressed This Visit     Encounter for general adult medical examination with abnormal findings - Primary (Chronic)    Preventative protocols reviewed and updated unless pt declined. Discussed healthy diet and lifestyle.       Type 2 diabetes mellitus with other specified complication (HCC)    Chronic, recent A1c slightly above goal at 7.2%.  Continue januvia 35m daily.  Encouraged following low sugar low carb diabetic diet.  RTC 6 mo DM f/u visit       Relevant Medications   atorvastatin (LIPITOR) 40 MG tablet   sitaGLIPtin (JANUVIA) 50 MG tablet   losartan (COZAAR) 25 MG tablet   Other Relevant Orders   Microalbumin / creatinine urine ratio   Hyperlipidemia associated  with type 2 diabetes mellitus (HCC)    Chronic on atorvastatin. Update FLP today.  The 10-year ASCVD risk score (Arnett DK, et al., 2019) is: 18.3%   Values used to calculate the score:     Age: [age]years     Sex: Male     Is Non-Hispanic African American: Yes     Diabetic: Yes     Tobacco smoker: No     Systolic Blood Pressure: 1623mmHg     Is BP treated: Yes     HDL Cholesterol: 54 mg/dL     Total Cholesterol: 210 mg/dL       Relevant Medications   amLODipine (NORVASC) 5 MG tablet   atorvastatin (LIPITOR) 40 MG tablet   sitaGLIPtin (JANUVIA) 50 MG tablet   losartan (COZAAR) 25 MG tablet   Other Relevant Orders   Comprehensive metabolic panel   Lipid panel   Obesity, Class I, BMI 30-34.9    Encourage healthy diet and lifestyle choices to affect sustainable weight loss.       Essential hypertension    Chronic, improved on benazepril however notes increased ED trouble. Will change ACEI to ARB - losartan 223mdaily. Update with effect.       Relevant Medications   amLODipine (NORVASC) 5 MG tablet   atorvastatin (LIPITOR) 40 MG tablet   losartan (COZAAR) 25 MG tablet   Ear pressure, left    Ongoing. Saw ENT, rec continue fluticasone and azelastine.         Meds ordered this encounter  Medications   amLODipine (NORVASC) 5 MG tablet    Sig: TAKE 1 TABLET (5 MG TOTAL) BY MOUTH DAILY.    Dispense:  90 tablet    Refill:  3   atorvastatin (LIPITOR) 40 MG tablet    Sig: TAKE 1 TABLET (40 MG TOTAL) BY MOUTH DAILY.    Dispense:  90 tablet    Refill:  3   sitaGLIPtin (JANUVIA) 50 MG tablet    Sig: Take 1 tablet (50 mg total) by mouth daily.    Dispense:  90 tablet    Refill:  3   losartan (COZAAR) 25 MG tablet    Sig: Take 1 tablet (25 mg total) by mouth daily.    Dispense:  90 tablet    Refill:  3    In place of benazepril    Orders Placed This Encounter  Procedures   Comprehensive metabolic panel   Lipid panel   Microalbumin / creatinine urine ratio      Patient instructions: Labs today  Stop benazepril, try losartan 2557maily in its  place. Continue other medicines including amlodipine daily. Let me know how you do with this new medicine.  Consider shingrix vaccine.  Schedule eye exam.  You are doing well today.  Return as needed or in 6 months for blood pressure and diabetes check.   Follow up plan: Return in about 6 months (around 11/20/2021) for follow up visit.  Ria Bush, MD

## 2021-05-22 NOTE — Assessment & Plan Note (Signed)
Chronic, improved on benazepril however notes increased ED trouble. Will change ACEI to ARB - losartan 25mg  daily. Update with effect.

## 2021-05-22 NOTE — Addendum Note (Signed)
Addended by: Ellamae Sia on: 05/22/2021 12:05 PM   Modules accepted: Orders

## 2021-05-22 NOTE — Assessment & Plan Note (Signed)
Preventative protocols reviewed and updated unless pt declined. Discussed healthy diet and lifestyle.  

## 2021-05-22 NOTE — Assessment & Plan Note (Signed)
Encourage healthy diet and lifestyle choices to affect sustainable weight loss.  

## 2021-05-22 NOTE — Assessment & Plan Note (Signed)
Chronic on atorvastatin. Update FLP today.  The 10-year ASCVD risk score (Arnett DK, et al., 2019) is: 18.3%   Values used to calculate the score:     Age: 55 years     Sex: Male     Is Non-Hispanic African American: Yes     Diabetic: Yes     Tobacco smoker: No     Systolic Blood Pressure: 517 mmHg     Is BP treated: Yes     HDL Cholesterol: 54 mg/dL     Total Cholesterol: 210 mg/dL

## 2021-05-22 NOTE — Patient Instructions (Addendum)
Labs today  Stop benazepril, try losartan 25mg  daily in its place. Continue other medicines including amlodipine daily. Let me know how you do with this new medicine.  Consider shingrix vaccine.  Schedule eye exam.  You are doing well today.  Return as needed or in 6 months for blood pressure and diabetes check.   Health Maintenance, Male Adopting a healthy lifestyle and getting preventive care are important in promoting health and wellness. Ask your health care provider about: The right schedule for you to have regular tests and exams. Things you can do on your own to prevent diseases and keep yourself healthy. What should I know about diet, weight, and exercise? Eat a healthy diet  Eat a diet that includes plenty of vegetables, fruits, low-fat dairy products, and lean protein. Do not eat a lot of foods that are high in solid fats, added sugars, or sodium. Maintain a healthy weight Body mass index (BMI) is a measurement that can be used to identify possible weight problems. It estimates body fat based on height and weight. Your health care provider can help determine your BMI and help you achieve or maintain a healthy weight. Get regular exercise Get regular exercise. This is one of the most important things you can do for your health. Most adults should: Exercise for at least 150 minutes each week. The exercise should increase your heart rate and make you sweat (moderate-intensity exercise). Do strengthening exercises at least twice a week. This is in addition to the moderate-intensity exercise. Spend less time sitting. Even light physical activity can be beneficial. Watch cholesterol and blood lipids Have your blood tested for lipids and cholesterol at 56 years of age, then have this test every 5 years. You may need to have your cholesterol levels checked more often if: Your lipid or cholesterol levels are high. You are older than 56 years of age. You are at high risk for heart  disease. What should I know about cancer screening? Many types of cancers can be detected early and may often be prevented. Depending on your health history and family history, you may need to have cancer screening at various ages. This may include screening for: Colorectal cancer. Prostate cancer. Skin cancer. Lung cancer. What should I know about heart disease, diabetes, and high blood pressure? Blood pressure and heart disease High blood pressure causes heart disease and increases the risk of stroke. This is more likely to develop in people who have high blood pressure readings or are overweight. Talk with your health care provider about your target blood pressure readings. Have your blood pressure checked: Every 3-5 years if you are 66-62 years of age. Every year if you are 31 years old or older. If you are between the ages of 7 and 58 and are a current or former smoker, ask your health care provider if you should have a one-time screening for abdominal aortic aneurysm (AAA). Diabetes Have regular diabetes screenings. This checks your fasting blood sugar level. Have the screening done: Once every three years after age 70 if you are at a normal weight and have a low risk for diabetes. More often and at a younger age if you are overweight or have a high risk for diabetes. What should I know about preventing infection? Hepatitis B If you have a higher risk for hepatitis B, you should be screened for this virus. Talk with your health care provider to find out if you are at risk for hepatitis B infection. Hepatitis C  Blood testing is recommended for: Everyone born from [birth year] through 1966. Anyone with known risk factors for hepatitis C. Sexually transmitted infections (STIs) You should be screened each year for STIs, including gonorrhea and chlamydia, if: You are sexually active and are younger than 56 years of age. You are older than 56 years of age and your health care provider tells you  that you are at risk for this type of infection. Your sexual activity has changed since you were last screened, and you are at increased risk for chlamydia or gonorrhea. Ask your health care provider if you are at risk. Ask your health care provider about whether you are at high risk for HIV. Your health care provider may recommend a prescription medicine to help prevent HIV infection. If you choose to take medicine to prevent HIV, you should first get tested for HIV. You should then be tested every 3 months for as long as you are taking the medicine. Follow these instructions at home: Alcohol use Do not drink alcohol if your health care provider tells you not to drink. If you drink alcohol: Limit how much you have to 0-2 drinks a day. Know how much alcohol is in your drink. In the [place] one drink equals one 12 oz bottle of beer (355 mL), one 5 oz glass of wine (148 mL), or one 1 oz glass of hard liquor (44 mL). Lifestyle Do not use any products that contain nicotine or tobacco. These products include cigarettes, chewing tobacco, and vaping devices, such as e-cigarettes. If you need help quitting, ask your health care provider. Do not use street drugs. Do not share needles. Ask your health care provider for help if you need support or information about quitting drugs. General instructions Schedule regular health, dental, and eye exams. Stay current with your vaccines. Tell your health care provider if: You often feel depressed. You have ever been abused or do not feel safe at home. Summary Adopting a healthy lifestyle and getting preventive care are important in promoting health and wellness. Follow your health care provider's instructions about healthy diet, exercising, and getting tested or screened for diseases. Follow your health care provider's instructions on monitoring your cholesterol and blood pressure. This information is not intended to replace advice given to you by your health  care provider. Make sure you discuss any questions you have with your health care provider. Document Revised: 10/16/2020 Document Reviewed: 10/16/2020 Elsevier Patient Education  [place].

## 2021-05-22 NOTE — Assessment & Plan Note (Signed)
Chronic, recent A1c slightly above goal at 7.2%.  Continue januvia 50mg  daily.  Encouraged following low sugar low carb diabetic diet.  RTC 6 mo DM f/u visit

## 2021-05-23 LAB — COMPREHENSIVE METABOLIC PANEL
ALT: 30 IU/L (ref 0–44)
AST: 24 IU/L (ref 0–40)
Albumin/Globulin Ratio: 2.7 — ABNORMAL HIGH (ref 1.2–2.2)
Albumin: 5.1 g/dL — ABNORMAL HIGH (ref 3.8–4.9)
Alkaline Phosphatase: 73 IU/L (ref 44–121)
BUN/Creatinine Ratio: 7 — ABNORMAL LOW (ref 9–20)
BUN: 8 mg/dL (ref 6–24)
Bilirubin Total: 0.6 mg/dL (ref 0.0–1.2)
CO2: 26 mmol/L (ref 20–29)
Calcium: 9.7 mg/dL (ref 8.7–10.2)
Chloride: 102 mmol/L (ref 96–106)
Creatinine, Ser: 1.16 mg/dL (ref 0.76–1.27)
Globulin, Total: 1.9 g/dL (ref 1.5–4.5)
Glucose: 109 mg/dL — ABNORMAL HIGH (ref 70–99)
Potassium: 4.2 mmol/L (ref 3.5–5.2)
Sodium: 141 mmol/L (ref 134–144)
Total Protein: 7 g/dL (ref 6.0–8.5)
eGFR: 74 mL/min/{1.73_m2} (ref >59)

## 2021-05-23 LAB — MICROALBUMIN / CREATININE URINE RATIO
Creatinine, Urine: 139.4 mg/dL
Microalb/Creat Ratio: <2 mg/g creat (ref 0–29)
Microalbumin, Urine: <3 ug/mL

## 2021-05-23 LAB — LIPID PANEL
Chol/HDL Ratio: 2.9 ratio (ref 0.0–5.0)
Cholesterol, Total: 162 mg/dL (ref 100–199)
HDL: 56 mg/dL (ref >39)
LDL Chol Calc (NIH): 83 mg/dL (ref 0–99)
Triglycerides: 129 mg/dL (ref 0–149)
VLDL Cholesterol Cal: 23 mg/dL (ref 5–40)

## 2021-06-25 ENCOUNTER — Other Ambulatory Visit: Payer: Self-pay

## 2021-07-20 ENCOUNTER — Other Ambulatory Visit: Payer: Self-pay

## 2021-08-16 ENCOUNTER — Other Ambulatory Visit: Payer: Self-pay

## 2021-08-27 ENCOUNTER — Other Ambulatory Visit: Payer: Self-pay

## 2021-09-09 ENCOUNTER — Other Ambulatory Visit: Payer: Self-pay | Admitting: Family Medicine

## 2021-09-09 DIAGNOSIS — E1169 Type 2 diabetes mellitus with other specified complication: Secondary | ICD-10-CM

## 2021-09-10 ENCOUNTER — Other Ambulatory Visit: Payer: Self-pay

## 2021-09-14 ENCOUNTER — Other Ambulatory Visit: Payer: Self-pay

## 2021-09-14 ENCOUNTER — Other Ambulatory Visit: Payer: Self-pay | Admitting: Family Medicine

## 2021-09-14 DIAGNOSIS — E1169 Type 2 diabetes mellitus with other specified complication: Secondary | ICD-10-CM

## 2021-09-14 MED ORDER — FREESTYLE LITE TEST VI STRP
ORAL_STRIP | 0 refills | Status: DC
  Filled 2021-09-14: qty 100, fill #0

## 2021-09-14 MED FILL — Blood Glucose Monitoring Kit w/ Device: Qty: 1 | Fill #0 | Status: CN

## 2021-09-17 ENCOUNTER — Other Ambulatory Visit: Payer: Self-pay

## 2021-09-26 ENCOUNTER — Other Ambulatory Visit: Payer: Self-pay

## 2021-10-18 ENCOUNTER — Other Ambulatory Visit: Payer: Self-pay

## 2021-10-29 ENCOUNTER — Emergency Department: Payer: No Typology Code available for payment source

## 2021-10-29 ENCOUNTER — Emergency Department
Admission: EM | Admit: 2021-10-29 | Discharge: 2021-10-29 | Disposition: A | Discharge status: Home / Self Care | Payer: No Typology Code available for payment source | Attending: Emergency Medicine | Admitting: Emergency Medicine

## 2021-10-29 ENCOUNTER — Telehealth: Payer: Self-pay | Admitting: Family Medicine

## 2021-10-29 ENCOUNTER — Other Ambulatory Visit: Payer: Self-pay

## 2021-10-29 DIAGNOSIS — R002 Palpitations: Secondary | ICD-10-CM | POA: Insufficient documentation

## 2021-10-29 DIAGNOSIS — E119 Type 2 diabetes mellitus without complications: Secondary | ICD-10-CM | POA: Insufficient documentation

## 2021-10-29 DIAGNOSIS — I10 Essential (primary) hypertension: Secondary | ICD-10-CM | POA: Insufficient documentation

## 2021-10-29 LAB — CBC
HCT: 43.3 % (ref 39.0–52.0)
Hemoglobin: 14 g/dL (ref 13.0–17.0)
MCH: 26.7 pg (ref 26.0–34.0)
MCHC: 32.3 g/dL (ref 30.0–36.0)
MCV: 82.6 fL (ref 80.0–100.0)
Platelets: 151 10*3/uL (ref 150–400)
RBC: 5.24 MIL/uL (ref 4.22–5.81)
RDW: 12.5 % (ref 11.5–15.5)
WBC: 3.3 10*3/uL — ABNORMAL LOW (ref 4.0–10.5)
nRBC: 0 % (ref 0.0–0.2)

## 2021-10-29 LAB — BASIC METABOLIC PANEL
Anion gap: 11 (ref 5–15)
BUN: 14 mg/dL (ref 6–20)
CO2: 24 mmol/L (ref 22–32)
Calcium: 9.7 mg/dL (ref 8.9–10.3)
Chloride: 103 mmol/L (ref 98–111)
Creatinine, Ser: 1.17 mg/dL (ref 0.61–1.24)
GFR, Estimated: >60 mL/min (ref >60)
Glucose, Bld: 132 mg/dL — ABNORMAL HIGH (ref 70–99)
Potassium: 3.6 mmol/L (ref 3.5–5.1)
Sodium: 138 mmol/L (ref 135–145)

## 2021-10-29 LAB — TROPONIN I (HIGH SENSITIVITY)
Troponin I (High Sensitivity): 8 ng/L (ref <18)
Troponin I (High Sensitivity): 10 ng/L (ref <18)

## 2021-10-29 NOTE — Telephone Encounter (Signed)
[facility] - Client TELEPHONE ADVICE RECORD [facility] Patient Name: Gerald Collins [facility] Gender: Male DOB: Oct 11, 1965 Age: 56 Y 14 M 9 D Return Phone Number: [phone] (Primary) Address: City/ State/ Zip: [place] [address] Client [facility] - Client Client Site [place] Provider Ria Bush - MD Contact Type Call Who Is Calling Patient / Member / Family / Caregiver Call Type Triage / Clinical Relationship To Patient Self Return Phone Number [phone] (Primary) Chief Complaint CHEST PAIN - pain, pressure, heaviness or tightness Reason for Call Symptomatic / Request for [place] states he is having circulation issues with his toes, heart palpations, shoulder pain that at times feel like chest pain , he said its hard to explain. Translation No Nurse Assessment Nurse: Nicki Reaper, RN, Malachy Mood Date/Time (Eastern Time): 10/29/2021 10:38:57 AM Confirm and document reason for call. If symptomatic, describe symptoms. ---Caller states he is having circulation issues with his toes, toes ae numb but can feel, has an old L shoulder injury and it popped this morning, rates pain 2-3/10, at times shoulder pain radiates to chest, no chest pain now, has heart palpitations that come and go, and had some palpitations this morning, symptoms comes and go and started a few days ago, denies SOB and other symptoms, Does the patient have any new or worsening symptoms? ---Yes Will a triage be completed? ---Yes Related visit to physician within the last 2 weeks? ---No Does the PT have any chronic conditions? (i.e. diabetes, asthma, this includes High risk factors for pregnancy, etc.) ---Yes List chronic conditions. ---DM HTN Is this a behavioral health or substance abuse call? ---No Guidelines Guideline Title Affirmed Question Affirmed Notes Nurse Date/Time  (Eastern Time) Neurologic Deficit [1] Numbness (i.e., loss of sensation) of the face, arm / hand, Nicki Reaper, RN, Malachy Mood 10/29/2021 10:43:20 AM PLEASE NOTE: All timestamps contained within this report are represented as [place] Standard Time. CONFIDENTIALTY NOTICE: This fax transmission is intended only for the addressee. It contains information that is legally privileged, confidential or otherwise protected from use or disclosure. If you are not the intended recipient, you are strictly prohibited from reviewing, disclosing, copying using or disseminating any of this information or taking any action in reliance on or regarding this information. If you have received this fax in error, please notify us immediately by telephone so that we can arrange for its return to [place]. Phone: [phone], Toll-Free: [phone], Fax: [phone] Page: 2 of 2 Call Id: [id number] Guidelines Guideline Title Affirmed Question Affirmed Notes Nurse Date/Time Eilene Ghazi Time) or leg / foot on one side of the body AND [2] gradual onset (e.g., days to weeks) AND [3] present now Disp. Time Eilene Ghazi Time) Disposition Final User 10/29/2021 10:36:22 AM Send to Urgent Consepcion Hearing 10/29/2021 10:48:54 AM See HCP within 4 Hours (or PCP triage) Yes Nicki Reaper, RN, Erskine Speed Disagree/Comply Comply Caller Understands Yes PreDisposition Call Doctor Care Advice Given Per Guideline SEE HCP (OR PCP TRIAGE) WITHIN 4 HOURS: * IF OFFICE WILL BE OPEN: You need to be seen within the next 3 or 4 hours. Call your doctor (or NP/PA) now or as soon as the office opens. CALL BACK IF: * You become worse Comments User: Burna Sis, RN Date/Time Eilene Ghazi Time): 10/29/2021 10:53:34 AM Spoke with office nurse [place] and no appts today, she instructed he will have to go to UC or ER, instructed he should go to ER but can go to  UC andif they feel he needs to go to ER will send him, instructed that I am upgrading him to go to ER due to his  symptoms Referrals REFERRED TO PCP OFFICE GO TO FACILITY UNDECIDE

## 2021-10-29 NOTE — Progress Notes (Signed)
Patient scheduled an appointment through my chart for 5.30.23, based on symptoms he entered (heart palpitations, circulation issues, feet), transferred call to Access Nurse

## 2021-10-29 NOTE — ED Triage Notes (Signed)
Reports intermittent palpitation to left chest that last "seconds" over the last few days. Denies chest pain, dizziness of SOB.  Hx of diabetes and hypertension.

## 2021-10-29 NOTE — ED Provider Notes (Signed)
[facility] Provider Note   Event Date/Time   First MD Initiated Contact with Patient 10/29/21 1503     (approximate) History  Palpitations  HPI Gerald Collins is a 56 y.o. male with stated past medical history of hypertension, high cholesterol, and diabetes who presents for palpitations.  Patient states these have been intermittent episodes over the past few weeks that have lasted less than 1 minute and resolved spontaneously.  Patient states that he has had 1 episode of similar symptoms in the past where he became diaphoretic and felt like he had no energy.  Patient states that during these current episodes he is also feeling a lack of energy but denies any shortness of breath or chest pain.  Patient does endorse some separate left anterior shoulder pain that comes and goes and usually occurs with movement.  Patient denies any correlation between these 2 symptoms.  Patient currently denies any vision changes, tinnitus, difficulty speaking, facial droop, sore throat, chest pain, shortness of breath, abdominal pain, nausea/vomiting/diarrhea, dysuria, or weakness/numbness/paresthesias in any extremity Physical Exam  Triage Vital Signs: ED Triage Vitals  Enc Vitals Group     BP 10/29/21 1209 (!) 163/103     Pulse Rate 10/29/21 1209 88     Resp 10/29/21 1209 14     Temp 10/29/21 1209 98.8 F (37.1 C)     Temp Source 10/29/21 1209 Oral     SpO2 10/29/21 1209 98 %     Weight 10/29/21 1207 208 lb (94.3 kg)     Height 10/29/21 1207 '5\' 11"'$  (1.803 m)     Head Circumference --      Peak Flow --      Pain Score 10/29/21 1207 0     Pain Loc --      Pain Edu? --      Excl. in [place]? --    Most recent vital signs: Vitals:   10/29/21 1209 10/29/21 1514  BP: (!) 163/103 (!) 160/98  Pulse: 88 80  Resp: 14 16  Temp: 98.8 F (37.1 C)   SpO2: 98% 98%   General: Awake, oriented x4. CV:  Good peripheral perfusion.  Resp:  Normal effort.  Abd:  No distention.   Other:  Patient is a middle-aged African-American male sitting in bed in no distress.  Healthy weight. ED Results / Procedures / Treatments  Labs (all labs ordered are listed, but only abnormal results are displayed) Labs Reviewed  BASIC METABOLIC PANEL - Abnormal; Notable for the following components:      Result Value   Glucose, Bld 132 (*)    All other components within normal limits  CBC - Abnormal; Notable for the following components:   WBC 3.3 (*)    All other components within normal limits  TROPONIN I (HIGH SENSITIVITY)  TROPONIN I (HIGH SENSITIVITY)   EKG ED ECG REPORT I, Naaman Plummer, the attending physician, personally viewed and interpreted this ECG. Date: 10/29/2021 EKG Time: 1215 Rate: 81 Rhythm: normal sinus rhythm QRS Axis: normal Intervals: normal ST/T Wave abnormalities: normal Narrative Interpretation: no evidence of acute ischemia RADIOLOGY ED MD interpretation: 2 view chest x-ray interpreted by me shows no evidence of acute abnormalities including no pneumonia, pneumothorax, or widened mediastinum -Agree with radiology assessment Official radiology report(s): DG Chest 2 View  Result Date: 10/29/2021 CLINICAL DATA:  No chest pain, palpitations EXAM: CHEST - 2 VIEW COMPARISON:  None Available. FINDINGS: The heart size and mediastinal contours are within normal  limits. Both lungs are clear. The visualized skeletal structures are unremarkable. IMPRESSION: No active cardiopulmonary disease. Electronically Signed   By: Kathreen Devoid M.D.   On: 10/29/2021 12:28   PROCEDURES: Critical Care performed: No .1-3 Lead EKG Interpretation Performed by: Naaman Plummer, MD Authorized by: Naaman Plummer, MD     Interpretation: normal     ECG rate:  81   ECG rate assessment: normal     Rhythm: sinus rhythm     Ectopy: none     Conduction: normal   MEDICATIONS ORDERED IN ED: Medications - No data to display IMPRESSION / MDM / [place] / ED COURSE  I  reviewed the triage vital signs and the nursing notes.                             The patient is on the cardiac monitor to evaluate for evidence of arrhythmia and/or significant heart rate changes. 56 year old male presents with palpitations. EKG: No STEMI and no evidence of Brugadas sign, delta wave, epsilon wave, significantly prolonged QTc, or malignant arrhythmia. Based on H&P and testing, this patient appears to be low risk for emergent causes of palpitations such as, but not limited to, a malignant cardiac arrhythmia, ACS, pulmonary embolism, thyrotoxicosis, PNA, PTX.  The patient has been given strict return precautions and understands the need for further outpatient testing and treatment.    FINAL CLINICAL IMPRESSION(S) / ED DIAGNOSES   Final diagnoses:  Palpitations   Rx / DC Orders   ED Discharge Orders          Ordered    Ambulatory referral to Cardiology        10/29/21 1538           Note:  This document was prepared using Dragon voice recognition software and may include unintentional dictation errors.   Naaman Plummer, MD 10/29/21 [phone]

## 2021-10-29 NOTE — Telephone Encounter (Signed)
Sarah RN with access nurse calls with pt having numbness in toes, lt shoulder pain that radiates into chest and palpitations. AN disposition is to go to ED or be seen within 3 - 4 hrs; no available appts at [facility] and Judson Roch will let pt know to go to ED for eval. Sending note to Dr Darnell Level and Glorianne Manchester CMA and will teams Lattie Haw also.

## 2021-10-29 NOTE — Telephone Encounter (Signed)
Patient scheduled an appointment  on 5.30.23 through my chart for palpitation issues, circulation and feet  Patient thought he scheduled for 5.23.23.  Called patient and transferred appointment to access nurse for further triage

## 2021-10-29 NOTE — Telephone Encounter (Signed)
Per chart review tab pt is presently at [facility] ED. Sending note to Dr Darnell Level and Lattie Haw CMA.

## 2021-10-30 ENCOUNTER — Encounter: Payer: Self-pay | Admitting: Family Medicine

## 2021-10-31 ENCOUNTER — Telehealth: Payer: Self-pay

## 2021-10-31 DIAGNOSIS — I10 Essential (primary) hypertension: Secondary | ICD-10-CM

## 2021-10-31 DIAGNOSIS — R002 Palpitations: Secondary | ICD-10-CM

## 2021-10-31 NOTE — Telephone Encounter (Signed)
Referral placed.

## 2021-10-31 NOTE — Addendum Note (Signed)
Addended by: Ria Bush on: 10/31/2021 05:22 PM   Modules accepted: Orders

## 2021-10-31 NOTE — Telephone Encounter (Signed)
Does pt need OV with you or can the ER notes be used to place cards referral?

## 2021-10-31 NOTE — Telephone Encounter (Signed)
Pt called and stated he has a referral to [place], "he has an appointment on Friday but he stated his insurance said the referral needs to be sent from his provider instead of the ED so it can be covered by insurance." Please return a call when possible. "He needs to fix this before Friday so it can be covered by his insurance."  Callback Number: [phone]

## 2021-11-01 NOTE — Telephone Encounter (Signed)
Spoke with pt relaying Dr. G's message.  Pt expresses his thanks.  ?

## 2021-11-02 ENCOUNTER — Ambulatory Visit (INDEPENDENT_AMBULATORY_CARE_PROVIDER_SITE_OTHER): Payer: No Typology Code available for payment source

## 2021-11-02 ENCOUNTER — Ambulatory Visit: Payer: No Typology Code available for payment source | Admitting: Cardiology

## 2021-11-02 ENCOUNTER — Encounter: Payer: Self-pay | Admitting: Cardiology

## 2021-11-02 VITALS — BP 148/80 | HR 79 | Ht 71.0 in | Wt 206.4 lb

## 2021-11-02 DIAGNOSIS — R002 Palpitations: Secondary | ICD-10-CM

## 2021-11-02 DIAGNOSIS — I10 Essential (primary) hypertension: Secondary | ICD-10-CM

## 2021-11-02 DIAGNOSIS — E7800 Pure hypercholesterolemia, unspecified: Secondary | ICD-10-CM | POA: Diagnosis not present

## 2021-11-02 NOTE — Telephone Encounter (Signed)
Noted.  See my chart message.

## 2021-11-02 NOTE — Patient Instructions (Signed)
Medication Instructions:   Your physician recommends that you continue on your current medications as directed. Please refer to the Current Medication list given to you today.   *If you need a refill on your cardiac medications before your next appointment, please call your pharmacy*   Lab Work: None ordered   Testing/Procedures:  Your physician has recommended that you wear a Zio XT monitor for 2 weeks. This will be mailed to your home address in 4-5 business days.   This monitor is a medical device that records the heart's electrical activity. Doctors most often use these monitors to diagnose arrhythmias. Arrhythmias are problems with the speed or rhythm of the heartbeat. The monitor is a small device applied to your chest. You can wear one while you do your normal daily activities. While wearing this monitor if you have any symptoms to push the button and record what you felt. Once you have worn this monitor for the period of time provider prescribed (Usually 14 days), you will return the monitor device in the postage paid box. Once it is returned they will download the data collected and provide [place] with a report which the provider will then review and we will call you with those results. Important tips:  Avoid showering during the first 24 hours of wearing the monitor. Avoid excessive sweating to help maximize wear time. Do not submerge the device, no hot tubs, and no swimming pools. Keep any lotions or oils away from the patch. After 24 hours you may shower with the patch on. Take brief showers with your back facing the shower head.  Do not remove patch once it has been placed because that will interrupt data and decrease adhesive wear time. Push the button when you have any symptoms and write down what you were feeling. Once you have completed wearing your monitor, remove and place into box which has postage paid and place in your outgoing mailbox.  If for some reason you have  misplaced your box then call our office and we can provide another box and/or mail it off for you.    Follow-Up: At [facility], you and your health needs are our priority.  As part of our continuing mission to provide you with exceptional heart care, we have created designated Provider Care Teams.  These Care Teams include your primary Cardiologist (physician) and Advanced Practice Providers (APPs -  Physician Assistants and Nurse Practitioners) who all work together to provide you with the care you need, when you need it.  We recommend signing up for the patient portal called "MyChart".  Sign up information is provided on this After Visit Summary.  MyChart is used to connect with patients for Virtual Visits (Telemedicine).  Patients are able to view lab/test results, encounter notes, upcoming appointments, etc.  Non-urgent messages can be sent to your provider as well.   To learn more about what you can do with MyChart, go to [email or web address].    Your next appointment:   6-8 week(s)  The format for your next appointment:   In Person  Provider:   You may see Kate Sable, MD or one of the following Advanced Practice Providers on your designated Care Team:   Murray Hodgkins, NP Christell Faith, PA-C Cadence Kathlen Mody, [place]    Other Instructions   Important Information About Sugar

## 2021-11-02 NOTE — Progress Notes (Signed)
Cardiology Office Note:    Date:  11/02/2021   ID:  Gerald Collins, DOB [birth year], MRN [id number]  PCP:  Ria Bush, MD   [facility] Providers Cardiologist:  Kate Sable, MD     Referring MD: Naaman Plummer, MD   No chief complaint on file.  Gerald Collins is a 56 y.o. male who is being seen today for the evaluation of palpitations at the request of Bradler, Vista Lawman, MD.   History of Present Illness:    Gerald Collins is a 56 y.o. male with a hx of hypertension, hyperlipidemia, diabetes who presents due to palpitations.  Symptoms of palpitations began about 8 months ago.  Symptoms typically last 20 seconds and then spontaneously resolved.  Had another episode 5 months ago.  Last episode was a week ago ongoing for about 3 to 4 days straight.  Symptoms will last 20 to 30 seconds and spontaneously resolved.  Due to increased frequency, patient presented to the emergency room, work-up with EKG was normal.  Takes medications as prescribed, blood pressure at home elevated although he states having a small BP cuff.  Has left shoulder pain, thinks he might of injured his rotator cuff.  Denies smoking.  Denies chest pain or shortness of breath.  Denies presyncope, syncope, edema.  Past Medical History:  Diagnosis Date   Diabetes type 2, controlled ([place]) 2012   controlled with lifestyle (diet, exercise) change   H/O: pneumonia 06/11/1987   HLD (hyperlipidemia)    HTN (hypertension)    no meds    Other specified visual disturbances     Past Surgical History:  Procedure Laterality Date   COLONOSCOPY  09/2015   hyperplastic polpy rpt 10 yrs Henrene Pastor)   SHOULDER ARTHROSCOPY WITH OPEN ROTATOR CUFF REPAIR Right 04/01/2019   RIGHT SHOULDER ARTHROSCOPY, ARTHROSCOPIC LABRAL REPAIR, SUBACROMIAL DECOMPRESSION, DISTAL CLAVICLE EXCISION, MINI OPEN ROTATOR CUFF REPAIR;  Thornton Park, MD   VASECTOMY  1998    Current Medications: Current Meds  Medication Sig   amLODipine  (NORVASC) 5 MG tablet TAKE 1 TABLET (5 MG TOTAL) BY MOUTH DAILY.   atorvastatin (LIPITOR) 40 MG tablet TAKE 1 TABLET (40 MG TOTAL) BY MOUTH DAILY.   Blood Glucose Monitoring Suppl (FREESTYLE FREEDOM LITE) w/Device KIT Dispense based on patient and insurance preference. Use up to four times daily as directed.   fluticasone (FLONASE) 50 MCG/ACT nasal spray 2 sprays in each nostril once daily   hydrOXYzine (ATARAX/VISTARIL) 25 MG tablet Take 0.5-1 tablets (12.5-25 mg total) by mouth 2 (two) times daily as needed for anxiety.   Lancets (FREESTYLE) lancets Use up to 4 times daily   losartan (COZAAR) 25 MG tablet Take 1 tablet (25 mg total) by mouth daily.   sitaGLIPtin (JANUVIA) 50 MG tablet Take 1 tablet (50 mg total) by mouth daily.   vitamin C (ASCORBIC ACID) 500 MG tablet Take 500 mg by mouth daily.     Allergies:   Latex and Metformin   Social History   Socioeconomic History   Marital status: Married    Spouse name: Not on file   Number of children: 2   Years of education: Not on file   Highest education level: Not on file  Occupational History   Occupation: LabTech    Employer: [employer]  Tobacco Use   Smoking status: Former    Types: Cigars   Smokeless tobacco: Never   Tobacco comments:    on occasion only-never regularly  Vaping Use  Vaping Use: Never used  Substance and Sexual Activity   Alcohol use: Yes    Alcohol/week: 0.0 standard drinks    Comment: Rare   Drug use: No   Sexual activity: Not on file  Other Topics Concern   Not on file  Social History Narrative   Caffeine: 0   Lives with wife Investment banker, corporate), 2 kids, no pets   Lab Tech at [employer]   Activity: 1-2 hours every morning combination of weights and aerobic exercise   Diet: good water, fruits/vegetables daily   Social Determinants of Health   Financial Resource Strain: Not on file  Food Insecurity: Not on file  Transportation Needs: Not on file  Physical Activity: Not on file  Stress: Not on file  Social  Connections: Not on file     Family History: The patient's family history includes Cancer in his maternal uncle and mother; Colon polyps in his maternal aunt and mother; Coronary artery disease (age of onset: 73) in his maternal grandfather; Healthy in his father; Hyperlipidemia in his mother; Hypertension in his mother; Other in his brother.  ROS:   Please see the history of present illness.     All other systems reviewed and are negative.  EKGs/Labs/Other Studies Reviewed:    The following studies were reviewed today:   EKG:  EKG is  ordered today.  The ekg ordered today demonstrates normal sinus rhythm, normal EKG  Recent Labs: 03/13/2021: TSH 1.180 05/22/2021: ALT 30 10/29/2021: BUN 14; Creatinine, Ser 1.17; Hemoglobin 14.0; Platelets 151; Potassium 3.6; Sodium 138  Recent Lipid Panel    Component Value Date/Time   CHOL 162 05/22/2021 1205   CHOL 188 11/08/2010 0000   TRIG 129 05/22/2021 1205   TRIG 101 11/08/2010 0000   HDL 56 05/22/2021 1205   CHOLHDL 2.9 05/22/2021 1205   CHOLHDL 6 07/17/2015 0828   VLDL 29.4 07/17/2015 0828   LDLCALC 83 05/22/2021 1205   LDLDIRECT 117 11/08/2010 0000     Risk Assessment/Calculations:         Physical Exam:    VS:  BP (!) 148/80   Pulse 79   Ht '5\' 11"'  (1.803 m)   Wt 206 lb 6.4 oz (93.6 kg)   SpO2 98%   BMI 28.79 kg/m     Wt Readings from Last 3 Encounters:  11/02/21 206 lb 6.4 oz (93.6 kg)  10/29/21 208 lb (94.3 kg)  05/22/21 219 lb 8 oz (99.6 kg)     GEN:  Well nourished, well developed in no acute distress HEENT: Normal NECK: No JVD; No carotid bruits LYMPHATICS: No lymphadenopathy CARDIAC: RRR, no murmurs, rubs, gallops RESPIRATORY:  Clear to auscultation without rales, wheezing or rhonchi  ABDOMEN: Soft, non-tender, non-distended MUSCULOSKELETAL:  No edema; No deformity  SKIN: Warm and dry NEUROLOGIC:  Alert and oriented x 3 PSYCHIATRIC:  Normal affect   ASSESSMENT:    1. Palpitations   2. Primary  hypertension   3. Pure hypercholesterolemia    PLAN:    In order of problems listed above:  Palpitations, placed 2-week cardiac monitor to evaluate any significant arrhythmias. Hypertension, BP elevated.  Continue current medications, low-salt diet advised, if BP stays elevated at follow-up visit, plan to increase losartan. Hyperlipidemia, cholesterol controlled, continue Lipitor.  Follow-up after cardiac monitor.      Medication Adjustments/Labs and Tests Ordered: Current medicines are reviewed at length with the patient today.  Concerns regarding medicines are outlined above.  Orders Placed This Encounter  Procedures   LONG  TERM MONITOR (3-14 DAYS)   EKG 12-Lead   No orders of the defined types were placed in this encounter.   Patient Instructions  Medication Instructions:   Your physician recommends that you continue on your current medications as directed. Please refer to the Current Medication list given to you today.   *If you need a refill on your cardiac medications before your next appointment, please call your pharmacy*   Lab Work: None ordered   Testing/Procedures:  Your physician has recommended that you wear a Zio XT monitor for 2 weeks. This will be mailed to your home address in 4-5 business days.   This monitor is a medical device that records the heart's electrical activity. Doctors most often use these monitors to diagnose arrhythmias. Arrhythmias are problems with the speed or rhythm of the heartbeat. The monitor is a small device applied to your chest. You can wear one while you do your normal daily activities. While wearing this monitor if you have any symptoms to push the button and record what you felt. Once you have worn this monitor for the period of time provider prescribed (Usually 14 days), you will return the monitor device in the postage paid box. Once it is returned they will download the data collected and provide [place] with a report which the  provider will then review and we will call you with those results. Important tips:  Avoid showering during the first 24 hours of wearing the monitor. Avoid excessive sweating to help maximize wear time. Do not submerge the device, no hot tubs, and no swimming pools. Keep any lotions or oils away from the patch. After 24 hours you may shower with the patch on. Take brief showers with your back facing the shower head.  Do not remove patch once it has been placed because that will interrupt data and decrease adhesive wear time. Push the button when you have any symptoms and write down what you were feeling. Once you have completed wearing your monitor, remove and place into box which has postage paid and place in your outgoing mailbox.  If for some reason you have misplaced your box then call our office and we can provide another box and/or mail it off for you.    Follow-Up: At [facility], you and your health needs are our priority.  As part of our continuing mission to provide you with exceptional heart care, we have created designated Provider Care Teams.  These Care Teams include your primary Cardiologist (physician) and Advanced Practice Providers (APPs -  Physician Assistants and Nurse Practitioners) who all work together to provide you with the care you need, when you need it.  We recommend signing up for the patient portal called "MyChart".  Sign up information is provided on this After Visit Summary.  MyChart is used to connect with patients for Virtual Visits (Telemedicine).  Patients are able to view lab/test results, encounter notes, upcoming appointments, etc.  Non-urgent messages can be sent to your provider as well.   To learn more about what you can do with MyChart, go to [email or web address].    Your next appointment:   6-8 week(s)  The format for your next appointment:   In Person  Provider:   You may see Kate Sable, MD or one of the following Advanced Practice  Providers on your designated Care Team:   Murray Hodgkins, NP Christell Faith, PA-C Cadence Kathlen Mody, [place]    Other Instructions   Important Information About Sugar  Signed, Kate Sable, MD  11/02/2021 5:06 PM    [place]

## 2021-11-06 ENCOUNTER — Other Ambulatory Visit: Payer: Self-pay

## 2021-11-06 ENCOUNTER — Ambulatory Visit (INDEPENDENT_AMBULATORY_CARE_PROVIDER_SITE_OTHER): Payer: No Typology Code available for payment source | Admitting: Family Medicine

## 2021-11-06 ENCOUNTER — Encounter: Payer: Self-pay | Admitting: Family Medicine

## 2021-11-06 VITALS — BP 150/82 | HR 57 | Temp 97.7°F | Ht 71.0 in | Wt 207.0 lb

## 2021-11-06 DIAGNOSIS — I10 Essential (primary) hypertension: Secondary | ICD-10-CM | POA: Diagnosis not present

## 2021-11-06 DIAGNOSIS — E1169 Type 2 diabetes mellitus with other specified complication: Secondary | ICD-10-CM

## 2021-11-06 DIAGNOSIS — R202 Paresthesia of skin: Secondary | ICD-10-CM

## 2021-11-06 DIAGNOSIS — M25512 Pain in left shoulder: Secondary | ICD-10-CM | POA: Diagnosis not present

## 2021-11-06 DIAGNOSIS — R002 Palpitations: Secondary | ICD-10-CM

## 2021-11-06 DIAGNOSIS — G8929 Other chronic pain: Secondary | ICD-10-CM

## 2021-11-06 LAB — POCT GLYCOSYLATED HEMOGLOBIN (HGB A1C): Hemoglobin A1C: 6.2 % — AB (ref 4.0–5.6)

## 2021-11-06 LAB — MAGNESIUM: Magnesium: 1.9 mg/dL (ref 1.5–2.5)

## 2021-11-06 LAB — TSH: TSH: 0.97 u[IU]/mL (ref 0.35–5.50)

## 2021-11-06 LAB — VITAMIN B12: Vitamin B-12: 687 pg/mL (ref 211–911)

## 2021-11-06 MED ORDER — LOSARTAN POTASSIUM 50 MG PO TABS
50.0000 mg | ORAL_TABLET | Freq: Every day | ORAL | 2 refills | Status: DC
  Filled 2021-11-06: qty 90, 90d supply, fill #0

## 2021-11-06 NOTE — Assessment & Plan Note (Addendum)
Check b12, magnesium, TSH.

## 2021-11-06 NOTE — Patient Instructions (Addendum)
Labs today.  Schedule eye exam.  Diabetes is doing great - actually in prediabetes range with weight loss! Stay off [place].  Increase losartan to '50mg'$  daily - new dose at pharmacy.  Good to see you today.  Cancel June appointment, return as needed or in 6 months for physical.

## 2021-11-06 NOTE — Assessment & Plan Note (Signed)
Chronic, elevated readings on current regimen - will increase losartan to '50mg'$  daily.

## 2021-11-06 NOTE — Assessment & Plan Note (Signed)
Great control with healthy diet and lifestyle changes! A1c down to 6.2% - actually prediabetes range. This is largely off [place] - will remain off at this time.  Encouraged schedule eye exam.

## 2021-11-06 NOTE — Assessment & Plan Note (Signed)
Pending cardiac event monitor, appreciate cards care.

## 2021-11-06 NOTE — Assessment & Plan Note (Signed)
H/o R RTC tear s/p repair Mack Guise) now with 6 months of left shoulder pain with movement - will refer back to ortho for further eval/management.

## 2021-11-06 NOTE — Progress Notes (Signed)
Patient ID: Gerald Collins, male    DOB: [birth year], 56 y.o.   MRN: [id number]  This visit was conducted in person.  BP (!) 150/82 (BP Location: Right Arm, Cuff Size: Large)   Pulse (!) 57   Temp 97.7 F (36.5 C) (Temporal)   Ht _0  (1.803 m)   Wt 207 lb (93.9 kg)   SpO2 97%   BMI 28.87 kg/m    CC: ER f/u visit  Subjective:   HPI: Gerald Collins is a 56 y.o. male presenting on 11/06/2021 for Hospitalization Follow-up (Seen on 10/29/21 at [facility] ED, dx palpitations. )   Recent ER visit presenting with palpitations and left anterior shoulder pain, separate issue.  Blood pressure on presentation was high at 160s over 100s.  EKG, chest x-ray and labs were reassuring including troponin.  Hospital records reviewed.  Subsequently saw cardiology Dr. Garen Lah, note reviewed.  Plan to 2-week cardiac monitor for further evaluation of palpitations/arrhythmias with close follow-up afterwards.   Last visit at his physical December 2022, we replaced benazepril 10 mg with losartan 25 mg due to possible ED to ACE inhibitor.  Has not been checking home BPs. Most recently checked 154/94.   L shoulder pain present for 6 months, starts posterior and travels to front - has been told has arthritis. H/o R shoulder RTC tear s/p repair Gerald Collins). Worse with movement of L shoulder in abduction, push ups. Hasn't tried anything for this. He's been using stretches. Denies inciting trauma/injury or falls.   DM -  Eye exam - due Foot exam - today.  Feb started fasting once weekly (whole day fast).  12 lb weight loss with healthy diet changes.  Home cbg's 80-90s.  He stopped [place] in Feb, then restarted last week.  He's had some intermittent paresthesias to fingers and toes.  Stays cold at night time, notes body twitching. No cramping.  Lab Results  Component Value Date   HGBA1C 6.2 (A) 11/06/2021   Diabetic Foot Exam - Simple   Simple Foot Form Diabetic Foot exam was performed with the  following findings: Yes 11/06/2021  8:36 AM  Visual Inspection No deformities, no ulcerations, no other skin breakdown bilaterally: Yes Sensation Testing Intact to touch and monofilament testing bilaterally: Yes Pulse Check Posterior Tibialis and Dorsalis pulse intact bilaterally: Yes Comments 2+ DP bilaterally         Relevant past medical, surgical, family and social history reviewed and updated as indicated. Interim medical history since our last visit reviewed. Allergies and medications reviewed and updated. Outpatient Medications Prior to Visit  Medication Sig Dispense Refill   amLODipine (NORVASC) 5 MG tablet TAKE 1 TABLET (5 MG TOTAL) BY MOUTH DAILY. 90 tablet 3   atorvastatin (LIPITOR) 40 MG tablet TAKE 1 TABLET (40 MG TOTAL) BY MOUTH DAILY. 90 tablet 3   Blood Glucose Monitoring Suppl (FREESTYLE FREEDOM LITE) w/Device KIT Dispense based on patient and insurance preference. Use up to four times daily as directed. 1 kit 0   hydrOXYzine (ATARAX/VISTARIL) 25 MG tablet Take 0.5-1 tablets (12.5-25 mg total) by mouth 2 (two) times daily as needed for anxiety. 30 tablet 0   Lancets (FREESTYLE) lancets Use up to 4 times daily 100 each 0   vitamin C (ASCORBIC ACID) 500 MG tablet Take 500 mg by mouth daily.     losartan (COZAAR) 25 MG tablet Take 1 tablet (25 mg total) by mouth daily. 90 tablet 3   sitaGLIPtin (JANUVIA) 50 MG tablet Take 1  tablet (50 mg total) by mouth daily. 90 tablet 3   azelastine (ASTELIN) 0.1 % nasal spray PLACE 2 SPRAYS INTO BOTH NOSTRILS 2 TIMES DAILY. USE IN EACH NOSTRIL AS DIRECTED (Patient not taking: Reported on 11/06/2021) 30 mL 3   fluticasone (FLONASE) 50 MCG/ACT nasal spray 2 sprays in each nostril once daily (Patient not taking: Reported on 11/06/2021) 16 g 12   No facility-administered medications prior to visit.     Per HPI unless specifically indicated in ROS section below Review of Systems  Objective:  BP (!) 150/82 (BP Location: Right Arm, Cuff  Size: Large)   Pulse (!) 57   Temp 97.7 F (36.5 C) (Temporal)   Ht _0  (1.803 m)   Wt 207 lb (93.9 kg)   SpO2 97%   BMI 28.87 kg/m   Wt Readings from Last 3 Encounters:  11/06/21 207 lb (93.9 kg)  11/02/21 206 lb 6.4 oz (93.6 kg)  10/29/21 208 lb (94.3 kg)      Physical Exam Vitals and nursing note reviewed.  Constitutional:      Appearance: Normal appearance. He is not ill-appearing.  Eyes:     Extraocular Movements: Extraocular movements intact.     Conjunctiva/sclera: Conjunctivae normal.     Pupils: Pupils are equal, round, and reactive to light.  Cardiovascular:     Rate and Rhythm: Normal rate and regular rhythm.     Pulses: Normal pulses.     Heart sounds: Normal heart sounds. No murmur heard. Pulmonary:     Effort: Pulmonary effort is normal. No respiratory distress.     Breath sounds: Normal breath sounds. No wheezing, rhonchi or rales.  Musculoskeletal:     Right lower leg: No edema.     Left lower leg: No edema.     Comments: See HPI for foot exam if done  Skin:    General: Skin is warm and dry.     Findings: No rash.  Neurological:     Mental Status: He is alert.  Psychiatric:        Mood and Affect: Mood normal.        Behavior: Behavior normal.      Results for orders placed or performed in visit on 11/06/21  POCT glycosylated hemoglobin (Hb A1C)  Result Value Ref Range   Hemoglobin A1C 6.2 (A) 4.0 - 5.6 %   HbA1c POC (<> result, manual entry)     HbA1c, POC (prediabetic range)     HbA1c, POC (controlled diabetic range)     Assessment & Plan:   Problem List Items Addressed This Visit     Type 2 diabetes mellitus with other specified complication ([place])    Great control with healthy diet and lifestyle changes! A1c down to 6.2% - actually prediabetes range. This is largely off [place] - will remain off at this time.  Encouraged schedule eye exam.        Relevant Medications   losartan (COZAAR) 50 MG tablet   Other Relevant Orders   POCT  glycosylated hemoglobin (Hb A1C) (Completed)   Essential hypertension - Primary    Chronic, elevated readings on current regimen - will increase losartan to 24m daily.        Relevant Medications   losartan (COZAAR) 50 MG tablet   Palpitation    Pending cardiac event monitor, appreciate cards care.        Chronic left shoulder pain    H/o R RTC tear s/p repair (Gerald Collins now with 6  months of left shoulder pain with movement - will refer back to ortho for further eval/management.        Relevant Orders   Ambulatory referral to Orthopedic Surgery   Paresthesias    Check b12, magnesium, TSH.        Relevant Orders   Magnesium   Vitamin B12   TSH   Serum protein electrophoresis with reflex     Meds ordered this encounter  Medications   losartan (COZAAR) 50 MG tablet    Sig: Take 1 tablet (50 mg total) by mouth daily.    Dispense:  90 tablet    Refill:  2    Note new dose   Orders Placed This Encounter  Procedures   Magnesium   Vitamin B12   TSH   Serum protein electrophoresis with reflex   Ambulatory referral to Orthopedic Surgery    Referral Priority:   Routine    Referral Type:   Surgical    Referral Reason:   Specialty Services Required    Requested Specialty:   Orthopedic Surgery    Number of Visits Requested:   1   POCT glycosylated hemoglobin (Hb A1C)     Patient Instructions  Labs today.  Schedule eye exam.  Diabetes is doing great - actually in prediabetes range with weight loss! Stay off [place].  Increase losartan to 68m daily - new dose at pharmacy.  Good to see you today.  Cancel June appointment, return as needed or in 6 months for physical.   Follow up plan: Return in about 6 months (around 05/09/2022) for annual exam, prior fasting for blood work.  JRia Bush MD

## 2021-11-08 LAB — PROTEIN ELECTROPHORESIS, SERUM, WITH REFLEX
Albumin ELP: 4.6 g/dL (ref 3.8–4.8)
Alpha 1: 0.3 g/dL (ref 0.2–0.3)
Alpha 2: 0.5 g/dL (ref 0.5–0.9)
Beta 2: 0.3 g/dL (ref 0.2–0.5)
Beta Globulin: 0.5 g/dL (ref 0.4–0.6)
Gamma Globulin: 0.8 g/dL (ref 0.8–1.7)
Total Protein: 7 g/dL (ref 6.1–8.1)

## 2021-11-21 ENCOUNTER — Ambulatory Visit: Payer: No Typology Code available for payment source | Admitting: Family Medicine

## 2021-11-23 ENCOUNTER — Encounter: Payer: Self-pay | Admitting: Family Medicine

## 2021-11-23 ENCOUNTER — Other Ambulatory Visit: Payer: Self-pay

## 2021-11-23 ENCOUNTER — Encounter: Payer: Self-pay | Admitting: Cardiology

## 2021-11-23 MED ORDER — METOPROLOL SUCCINATE ER 25 MG PO TB24
25.0000 mg | ORAL_TABLET | Freq: Every day | ORAL | 5 refills | Status: DC
  Filled 2021-11-23: qty 30, 30d supply, fill #0

## 2021-11-23 NOTE — Telephone Encounter (Signed)
Called patient and left a VM requesting a call back. 

## 2021-11-23 NOTE — Telephone Encounter (Signed)
The patient has been notified of the result and verbalized understanding.  All questions (if any) were answered. Kavin Leech, RN 11/23/2021 5:07 PM

## 2021-11-23 NOTE — Telephone Encounter (Signed)
-----   Message from Kate Sable, MD sent at 11/23/2021  8:35 AM EDT ----- Occasional paroxysmal SVT noted.  Likely reason for palpitations. No other significant arrhythmias noted.  Start Toprol-XL 25 mg daily, keep follow-up appointment.

## 2021-12-03 ENCOUNTER — Other Ambulatory Visit: Payer: Self-pay

## 2021-12-03 MED ORDER — MELOXICAM 7.5 MG PO TABS
ORAL_TABLET | ORAL | 1 refills | Status: DC
  Filled 2021-12-03: qty 60, 30d supply, fill #0
  Filled 2022-02-20: qty 60, 30d supply, fill #1

## 2021-12-21 ENCOUNTER — Ambulatory Visit (INDEPENDENT_AMBULATORY_CARE_PROVIDER_SITE_OTHER): Payer: No Typology Code available for payment source | Admitting: Cardiology

## 2021-12-21 ENCOUNTER — Encounter: Payer: Self-pay | Admitting: Cardiology

## 2021-12-21 ENCOUNTER — Other Ambulatory Visit: Payer: Self-pay

## 2021-12-21 VITALS — BP 142/82 | HR 73 | Ht 71.0 in | Wt 213.8 lb

## 2021-12-21 DIAGNOSIS — I10 Essential (primary) hypertension: Secondary | ICD-10-CM

## 2021-12-21 DIAGNOSIS — R002 Palpitations: Secondary | ICD-10-CM

## 2021-12-21 DIAGNOSIS — E7800 Pure hypercholesterolemia, unspecified: Secondary | ICD-10-CM

## 2021-12-21 MED ORDER — LOSARTAN POTASSIUM 100 MG PO TABS
100.0000 mg | ORAL_TABLET | Freq: Every day | ORAL | 5 refills | Status: DC
  Filled 2021-12-21 – 2021-12-27 (×2): qty 30, 30d supply, fill #0
  Filled 2022-01-23: qty 30, 30d supply, fill #1
  Filled 2022-02-20: qty 30, 30d supply, fill #2
  Filled 2022-03-25: qty 30, 30d supply, fill #3
  Filled 2022-04-21: qty 30, 30d supply, fill #4
  Filled 2022-05-21: qty 30, 30d supply, fill #5

## 2021-12-21 NOTE — Patient Instructions (Signed)
Medication Instructions:   Your physician has recommended you make the following change in your medication:    STOP taking your Metoprolol Succinate.  2.    INCREASE your Losartan to 100 MG once a day.  *If you need a refill on your cardiac medications before your next appointment, please call your pharmacy*   Follow-Up: At [facility], you and your health needs are our priority.  As part of our continuing mission to provide you with exceptional heart care, we have created designated Provider Care Teams.  These Care Teams include your primary Cardiologist (physician) and Advanced Practice Providers (APPs -  Physician Assistants and Nurse Practitioners) who all work together to provide you with the care you need, when you need it.  We recommend signing up for the patient portal called "MyChart".  Sign up information is provided on this After Visit Summary.  MyChart is used to connect with patients for Virtual Visits (Telemedicine).  Patients are able to view lab/test results, encounter notes, upcoming appointments, etc.  Non-urgent messages can be sent to your provider as well.   To learn more about what you can do with MyChart, go to [email or web address].    Your next appointment:   5 month(s)  The format for your next appointment:   In Person  Provider:   Kate Sable, MD    Other Instructions   Important Information About Sugar

## 2021-12-21 NOTE — Progress Notes (Signed)
Cardiology Office Note:    Date:  12/21/2021   ID:  Gerald Collins, DOB Aug 26, 1965, MRN [id number]  PCP:  Ria Bush, MD   [place] [facility] Cardiologist:  Kate Sable, MD     Referring MD: Ria Bush, MD   Chief Complaint  Patient presents with   Follow-up    6-8 week follow up. Patient states that he feels fine.  Meds reviewed with patient.     History of Present Illness:    Gerald Collins is a 56 y.o. male with a hx of hypertension, hyperlipidemia, diabetes who presents for follow-up, previously seen due to palpitations.  Symptoms of palpitations were associated with stress from work.  He owns his own business.  Cardiac monitor was placed to evaluate any significant arrhythmias.  Checks his blood pressure at home, systolics usually in the 989Q.  States palpitations have improved since performing stress relieving maneuvers.  Otherwise feels okay.   Past Medical History:  Diagnosis Date   Diabetes type 2, controlled ([place]) 2012   controlled with lifestyle (diet, exercise) change   H/O: pneumonia 06/11/1987   HLD (hyperlipidemia)    HTN (hypertension)    no meds    Other specified visual disturbances     Past Surgical History:  Procedure Laterality Date   COLONOSCOPY  09/2015   hyperplastic polpy rpt 10 yrs Henrene Pastor)   SHOULDER ARTHROSCOPY WITH OPEN ROTATOR CUFF REPAIR Right 04/01/2019   RIGHT SHOULDER ARTHROSCOPY, ARTHROSCOPIC LABRAL REPAIR, SUBACROMIAL DECOMPRESSION, DISTAL CLAVICLE EXCISION, MINI OPEN ROTATOR CUFF REPAIR;  Thornton Park, MD   VASECTOMY  1998    Current Medications: Current Meds  Medication Sig   amLODipine (NORVASC) 5 MG tablet TAKE 1 TABLET (5 MG TOTAL) BY MOUTH DAILY.   atorvastatin (LIPITOR) 40 MG tablet TAKE 1 TABLET (40 MG TOTAL) BY MOUTH DAILY.   Blood Glucose Monitoring Suppl (FREESTYLE FREEDOM LITE) w/Device KIT Dispense based on patient and insurance preference. Use up to four times daily as directed.   hydrOXYzine  (ATARAX/VISTARIL) 25 MG tablet Take 0.5-1 tablets (12.5-25 mg total) by mouth 2 (two) times daily as needed for anxiety.   Lancets (FREESTYLE) lancets Use up to 4 times daily   losartan (COZAAR) 100 MG tablet Take 1 tablet (100 mg total) by mouth daily.   meloxicam (MOBIC) 7.5 MG tablet Take 1 tablet twice a day by oral route.   vitamin C (ASCORBIC ACID) 500 MG tablet Take 500 mg by mouth daily.   [DISCONTINUED] losartan (COZAAR) 50 MG tablet Take 1 tablet (50 mg total) by mouth daily.   [DISCONTINUED] metoprolol succinate (TOPROL XL) 25 MG 24 hr tablet Take 1 tablet (25 mg total) by mouth daily.     Allergies:   Latex and Metformin   Social History   Socioeconomic History   Marital status: Married    Spouse name: Not on file   Number of children: 2   Years of education: Not on file   Highest education level: Not on file  Occupational History   Occupation: [employer]    Employer: [employer]  Tobacco Use   Smoking status: Former    Types: Cigars   Smokeless tobacco: Never   Tobacco comments:    on occasion only-never regularly  Vaping Use   Vaping Use: Never used  Substance and Sexual Activity   Alcohol use: Yes    Alcohol/week: 0.0 standard drinks of alcohol    Comment: Rare   Drug use: No   Sexual activity: Not on  file  Other Topics Concern   Not on file  Social History Narrative   Caffeine: 0   Lives with wife Investment banker, corporate), 2 kids, no pets   Quarry manager at [employer]   Activity: 1-2 hours every morning combination of weights and aerobic exercise   Diet: good water, fruits/vegetables daily   Social Determinants of Health   Financial Resource Strain: Not on file  Food Insecurity: Not on file  Transportation Needs: Not on file  Physical Activity: Not on file  Stress: Not on file  Social Connections: Not on file     Family History: The patient's family history includes Cancer in his maternal uncle and mother; Colon polyps in his maternal aunt and mother; Coronary artery disease  (age of onset: 42) in his maternal grandfather; Healthy in his father; Hyperlipidemia in his mother; Hypertension in his mother; Other in his brother.  ROS:   Please see the history of present illness.     All other systems reviewed and are negative.  EKGs/Labs/Other Studies Reviewed:    The following studies were reviewed today:   EKG:  EKG not ordered today.    Recent Labs: 05/22/2021: ALT 30 10/29/2021: BUN 14; Creatinine, Ser 1.17; Hemoglobin 14.0; Platelets 151; Potassium 3.6; Sodium 138 11/06/2021: Magnesium 1.9; TSH 0.97  Recent Lipid Panel    Component Value Date/Time   CHOL 162 05/22/2021 1205   CHOL 188 11/08/2010 0000   TRIG 129 05/22/2021 1205   TRIG 101 11/08/2010 0000   HDL 56 05/22/2021 1205   CHOLHDL 2.9 05/22/2021 1205   CHOLHDL 6 07/17/2015 0828   VLDL 29.4 07/17/2015 0828   LDLCALC 83 05/22/2021 1205   LDLDIRECT 117 11/08/2010 0000     Risk Assessment/Calculations:         Physical Exam:    VS:  BP (!) 142/82 (BP Location: Left Arm, Patient Position: Sitting, Cuff Size: Normal)   Pulse 73   Ht '5\' 11"'  (1.803 m)   Wt 213 lb 12.8 oz (97 kg)   SpO2 99%   BMI 29.82 kg/m     Wt Readings from Last 3 Encounters:  12/21/21 213 lb 12.8 oz (97 kg)  11/06/21 207 lb (93.9 kg)  11/02/21 206 lb 6.4 oz (93.6 kg)     GEN:  Well nourished, well developed in no acute distress HEENT: Normal NECK: No JVD; No carotid bruits LYMPHATICS: No lymphadenopathy CARDIAC: RRR, no murmurs, rubs, gallops RESPIRATORY:  Clear to auscultation without rales, wheezing or rhonchi  ABDOMEN: Soft, non-tender, non-distended MUSCULOSKELETAL:  No edema; No deformity  SKIN: Warm and dry NEUROLOGIC:  Alert and oriented x 3 PSYCHIATRIC:  Normal affect   ASSESSMENT:    1. Palpitations   2. Primary hypertension   3. Pure hypercholesterolemia     PLAN:    In order of problems listed above:  Palpitations, cardiac monitor showed occasional paroxysmal SVT, symptoms improved  with stress reduction maneuvers.  Patient declined beta-blocker for now with symptoms improvement, monitor off beta-blockers at this time Hypertension, BP elevated.  Increase losartan to 100 mg daily, continue amlodipine. Hyperlipidemia, cholesterol controlled, continue Lipitor.  Follow-up in 4 to 5 months      Medication Adjustments/Labs and Tests Ordered: Current medicines are reviewed at length with the patient today.  Concerns regarding medicines are outlined above.  No orders of the defined types were placed in this encounter.  Meds ordered this encounter  Medications   losartan (COZAAR) 100 MG tablet    Sig: Take 1 tablet (  100 mg total) by mouth daily.    Dispense:  30 tablet    Refill:  5    Patient Instructions  Medication Instructions:   Your physician has recommended you make the following change in your medication:    STOP taking your Metoprolol Succinate.  2.    INCREASE your Losartan to 100 MG once a day.  *If you need a refill on your cardiac medications before your next appointment, please call your pharmacy*   Follow-Up: At [facility], you and your health needs are our priority.  As part of our continuing mission to provide you with exceptional heart care, we have created designated Provider Care Teams.  These Care Teams include your primary Cardiologist (physician) and Advanced Practice Providers (APPs -  Physician Assistants and Nurse Practitioners) who all work together to provide you with the care you need, when you need it.  We recommend signing up for the patient portal called "MyChart".  Sign up information is provided on this After Visit Summary.  MyChart is used to connect with patients for Virtual Visits (Telemedicine).  Patients are able to view lab/test results, encounter notes, upcoming appointments, etc.  Non-urgent messages can be sent to your provider as well.   To learn more about what you can do with MyChart, go to [email or web address].     Your next appointment:   5 month(s)  The format for your next appointment:   In Person  Provider:   Kate Sable, MD    Other Instructions   Important Information About Sugar          Signed, Kate Sable, MD  12/21/2021 5:22 PM    [place]

## 2021-12-27 ENCOUNTER — Other Ambulatory Visit: Payer: Self-pay

## 2021-12-31 ENCOUNTER — Other Ambulatory Visit: Payer: Self-pay

## 2022-01-23 ENCOUNTER — Other Ambulatory Visit: Payer: Self-pay

## 2022-02-20 ENCOUNTER — Other Ambulatory Visit: Payer: Self-pay | Admitting: Family Medicine

## 2022-02-20 ENCOUNTER — Other Ambulatory Visit: Payer: Self-pay

## 2022-02-20 ENCOUNTER — Encounter: Payer: Self-pay | Admitting: Cardiology

## 2022-02-20 MED ORDER — AZELASTINE HCL 0.1 % NA SOLN
2.0000 | Freq: Two times a day (BID) | NASAL | 3 refills | Status: DC
  Filled 2022-02-20: qty 30, 30d supply, fill #0
  Filled 2022-03-23: qty 30, 30d supply, fill #1
  Filled 2022-05-15: qty 30, 30d supply, fill #2
  Filled 2022-11-18: qty 30, 30d supply, fill #3

## 2022-02-20 MED ORDER — FREESTYLE LANCETS MISC
0 refills | Status: DC
  Filled 2022-02-20: qty 100, 25d supply, fill #0

## 2022-02-20 MED ORDER — GLUCOSE BLOOD VI STRP
ORAL_STRIP | 0 refills | Status: DC
  Filled 2022-02-20: qty 100, 25d supply, fill #0

## 2022-02-20 MED FILL — Blood Glucose Monitoring Kit w/ Device: 30 days supply | Qty: 1 | Fill #0 | Status: AC

## 2022-02-25 ENCOUNTER — Other Ambulatory Visit: Payer: Self-pay

## 2022-03-14 ENCOUNTER — Other Ambulatory Visit: Payer: Self-pay | Admitting: Family Medicine

## 2022-03-14 DIAGNOSIS — E291 Testicular hypofunction: Secondary | ICD-10-CM

## 2022-03-14 DIAGNOSIS — E1169 Type 2 diabetes mellitus with other specified complication: Secondary | ICD-10-CM

## 2022-03-14 DIAGNOSIS — Z1159 Encounter for screening for other viral diseases: Secondary | ICD-10-CM

## 2022-03-14 DIAGNOSIS — Z125 Encounter for screening for malignant neoplasm of prostate: Secondary | ICD-10-CM

## 2022-03-15 ENCOUNTER — Other Ambulatory Visit: Payer: No Typology Code available for payment source

## 2022-03-18 ENCOUNTER — Other Ambulatory Visit (INDEPENDENT_AMBULATORY_CARE_PROVIDER_SITE_OTHER): Payer: No Typology Code available for payment source

## 2022-03-18 DIAGNOSIS — E1169 Type 2 diabetes mellitus with other specified complication: Secondary | ICD-10-CM

## 2022-03-18 DIAGNOSIS — E785 Hyperlipidemia, unspecified: Secondary | ICD-10-CM

## 2022-03-18 DIAGNOSIS — Z1159 Encounter for screening for other viral diseases: Secondary | ICD-10-CM

## 2022-03-18 DIAGNOSIS — E291 Testicular hypofunction: Secondary | ICD-10-CM

## 2022-03-18 DIAGNOSIS — Z125 Encounter for screening for malignant neoplasm of prostate: Secondary | ICD-10-CM | POA: Diagnosis not present

## 2022-03-18 LAB — COMPREHENSIVE METABOLIC PANEL
ALT: 33 U/L (ref 0–53)
AST: 27 U/L (ref 0–37)
Albumin: 4.7 g/dL (ref 3.5–5.2)
Alkaline Phosphatase: 66 U/L (ref 39–117)
BUN: 12 mg/dL (ref 6–23)
CO2: 24 mEq/L (ref 19–32)
Calcium: 9.5 mg/dL (ref 8.4–10.5)
Chloride: 103 mEq/L (ref 96–112)
Creatinine, Ser: 1.14 mg/dL (ref 0.40–1.50)
GFR: 71.7 mL/min (ref >60.00)
Glucose, Bld: 118 mg/dL — ABNORMAL HIGH (ref 70–99)
Potassium: 3.8 mEq/L (ref 3.5–5.1)
Sodium: 140 mEq/L (ref 135–145)
Total Bilirubin: 0.7 mg/dL (ref 0.2–1.2)
Total Protein: 7.1 g/dL (ref 6.0–8.3)

## 2022-03-18 LAB — PSA: PSA: 0.12 ng/mL (ref 0.10–4.00)

## 2022-03-18 LAB — MICROALBUMIN / CREATININE URINE RATIO
Creatinine,U: 133.2 mg/dL
Microalb Creat Ratio: 0.5 mg/g (ref 0.0–30.0)
Microalb, Ur: <0.7 mg/dL (ref 0.0–1.9)

## 2022-03-18 LAB — LIPID PANEL
Cholesterol: 128 mg/dL (ref 0–200)
HDL: 63.7 mg/dL (ref >39.00)
LDL Cholesterol: 52 mg/dL (ref 0–99)
NonHDL: 64.47
Total CHOL/HDL Ratio: 2
Triglycerides: 60 mg/dL (ref 0.0–149.0)
VLDL: 12 mg/dL (ref 0.0–40.0)

## 2022-03-18 LAB — HEMOGLOBIN A1C: Hgb A1c MFr Bld: 6.8 % — ABNORMAL HIGH (ref 4.6–6.5)

## 2022-03-18 LAB — TESTOSTERONE: Testosterone: 188.3 ng/dL — ABNORMAL LOW (ref 300.00–890.00)

## 2022-03-19 LAB — HEPATITIS C ANTIBODY: Hepatitis C Ab: NONREACTIVE

## 2022-03-22 ENCOUNTER — Ambulatory Visit (INDEPENDENT_AMBULATORY_CARE_PROVIDER_SITE_OTHER): Payer: No Typology Code available for payment source | Admitting: Family Medicine

## 2022-03-22 ENCOUNTER — Encounter: Payer: Self-pay | Admitting: Family Medicine

## 2022-03-22 ENCOUNTER — Other Ambulatory Visit: Payer: Self-pay

## 2022-03-22 VITALS — BP 140/72 | HR 73 | Temp 97.7°F | Ht 71.5 in | Wt 206.4 lb

## 2022-03-22 DIAGNOSIS — H18519 Endothelial corneal dystrophy, unspecified eye: Secondary | ICD-10-CM

## 2022-03-22 DIAGNOSIS — Z0000 Encounter for general adult medical examination without abnormal findings: Secondary | ICD-10-CM | POA: Diagnosis not present

## 2022-03-22 DIAGNOSIS — I10 Essential (primary) hypertension: Secondary | ICD-10-CM

## 2022-03-22 DIAGNOSIS — E785 Hyperlipidemia, unspecified: Secondary | ICD-10-CM

## 2022-03-22 DIAGNOSIS — E1169 Type 2 diabetes mellitus with other specified complication: Secondary | ICD-10-CM

## 2022-03-22 DIAGNOSIS — E291 Testicular hypofunction: Secondary | ICD-10-CM | POA: Diagnosis not present

## 2022-03-22 MED ORDER — TESTOSTERONE 20.25 MG/ACT (1.62%) TD GEL
1.0000 | Freq: Every day | TRANSDERMAL | 5 refills | Status: DC
  Filled 2022-03-22: qty 75, 60d supply, fill #0
  Filled 2022-03-26: qty 75, 90d supply, fill #0
  Filled 2022-07-22: qty 75, 90d supply, fill #1

## 2022-03-22 NOTE — Progress Notes (Signed)
Patient ID: Gerald Collins, male    DOB: 12/21/1965, 56 y.o.   MRN: [id number]  This visit was conducted in person.  BP (!) 140/72 (BP Location: Right Arm, Cuff Size: Large)   Pulse 73   Temp 97.7 F (36.5 C) (Temporal)   Ht 5' 11.5" (1.816 m)   Wt 206 lb 6 oz (93.6 kg)   SpO2 96%   BMI 28.38 kg/m   BP Readings from Last 3 Encounters:  03/22/22 (!) 140/72  12/21/21 (!) 142/82  11/06/21 (!) 150/82    CC: CPE Subjective:   HPI: Gerald Collins is a 56 y.o. male presenting on 03/22/2022 for Annual Exam   DM - currently diet controlled diabetes. Has freestyle glucometer he uses PRN.   HTN - on amlodipine 69m daily, losartan 1074mdaily. BP remaining mildly elevated today - he just finished exercising before coming into office today. Home readings 127-131/80s.   Notes some sleepiness in afternoons, denies significant depressed mood, overall stable libido.    Preventative: COLONOSCOPY 09/2015 hyperplastic polpy rpt 10 yrs (PHenrene PastorProstate cancer - no fmhx. Asxs. Yearly PSA.  Lung cancer screen - not eligible  Flu - doesn't receive in h/o bad reaction  COVID vaccine - [place]/2021, 09/2019, no booster Pneumovax 03/2020 Td 2012, Tdap 2018  Shingrix - discussed, declines but will consider  Seat belt use discussed  Sunscreen use discussed. No changing moles on skin.  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 Interim medical history since our last visit reviewed. Allergies and medications reviewed and updated. Outpatient Medications Prior to Visit  Medication Sig Dispense Refill   amLODipine  (NORVASC) 5 MG tablet TAKE 1 TABLET (5 MG TOTAL) BY MOUTH DAILY. 90 tablet 3   atorvastatin (LIPITOR) 40 MG tablet TAKE 1 TABLET (40 MG TOTAL) BY MOUTH DAILY. 90 tablet 3   azelastine (ASTELIN) 0.1 % nasal spray Place 2 sprays into both nostrils 2 (two) times daily. 30 mL 3   Blood Glucose Monitoring Suppl (FREESTYLE FREEDOM LITE) w/Device KIT Use up to four times daily as directed. 1 kit 0   fluticasone (FLONASE) 50 MCG/ACT nasal spray 2 sprays in each nostril once daily 16 g 12   glucose blood test strip Use up to four times daily as directed 100 each 0   Lancets (FREESTYLE) lancets Use up to 4 times daily 100 each 0   Lancets (FREESTYLE) lancets Use up to four times daily as directed 100 each 0   losartan (COZAAR) 100 MG tablet Take 1 tablet (100 mg total) by mouth daily. 30 tablet 5   vitamin C (ASCORBIC ACID) 500 MG tablet Take 500 mg by mouth daily.     hydrOXYzine (ATARAX/VISTARIL) 25 MG tablet Take 0.5-1 tablets (12.5-25 mg total) by mouth 2 (two) times daily as needed for anxiety. 30 tablet 0   meloxicam (MOBIC) 7.5 MG tablet Take 1 tablet twice a day by oral route. 60 tablet 1   No facility-administered medications prior to visit.     Per HPI unless specifically indicated in ROS section below Review of Systems  Constitutional:  Negative for activity change, appetite change, chills, fatigue, fever and unexpected weight change.  HENT:  Negative for hearing loss.   Eyes:  Negative for visual disturbance.  Respiratory:  Negative for cough, chest tightness, shortness of breath and wheezing.   Cardiovascular:  Negative for chest pain, palpitations and leg swelling.  Gastrointestinal:  Negative for abdominal distention, abdominal pain, blood in stool, constipation, diarrhea, nausea and vomiting.  Genitourinary:  Negative for difficulty urinating and hematuria.  Musculoskeletal:  Negative for arthralgias, myalgias and neck pain.  Skin:  Negative for rash.  Neurological:  Positive for  dizziness (x1, resolved). Negative for seizures, syncope and headaches.  Hematological:  Negative for adenopathy. Does not bruise/bleed easily.  Psychiatric/Behavioral:  Negative for dysphoric mood. The patient is not nervous/anxious.     Objective:  BP (!) 140/72 (BP Location: Right Arm, Cuff Size: Large)   Pulse 73   Temp 97.7 F (36.5 C) (Temporal)   Ht 5' 11.5" (1.816 m)   Wt 206 lb 6 oz (93.6 kg)   SpO2 96%   BMI 28.38 kg/m   Wt Readings from Last 3 Encounters:  03/22/22 206 lb 6 oz (93.6 kg)  12/21/21 213 lb 12.8 oz (97 kg)  11/06/21 207 lb (93.9 kg)      Physical Exam Vitals and nursing note reviewed.  Constitutional:      General: He is not in acute distress.    Appearance: Normal appearance. He is well-developed. He is not ill-appearing.  HENT:     Head: Normocephalic and atraumatic.     Right Ear: Hearing, tympanic membrane, ear canal and external ear normal.     Left Ear: Hearing, tympanic membrane, ear canal and external ear normal.  Eyes:     General: No scleral icterus.    Extraocular Movements: Extraocular movements intact.     Conjunctiva/sclera: Conjunctivae normal.     Pupils: Pupils are equal, round, and reactive to light.  Neck:     Thyroid: No thyroid mass or thyromegaly.  Cardiovascular:     Rate and Rhythm: Normal rate and regular rhythm.     Pulses: Normal pulses.          Radial pulses are 2+ on the right side and 2+ on the left side.     Heart sounds: Normal heart sounds. No murmur heard. Pulmonary:     Effort: Pulmonary effort is normal. No respiratory distress.     Breath sounds: Normal breath sounds. No wheezing, rhonchi or rales.  Abdominal:     General: Bowel sounds are normal. There is no distension.     Palpations: Abdomen is soft. There is no mass.     Tenderness: There is no abdominal tenderness. There is no guarding or rebound.     Hernia: No hernia is present.  Musculoskeletal:        General: Normal range of motion.      Cervical back: Normal range of motion and neck supple.     Right lower leg: No edema.     Left lower leg: No edema.  Lymphadenopathy:     Cervical: No cervical adenopathy.  Skin:    General: Skin is warm and dry.     Findings: No rash.  Neurological:     General: No focal deficit present.     Mental Status: He is alert and oriented to person, place, and time.  Psychiatric:        Mood and Affect: Mood normal.  Behavior: Behavior normal.        Thought Content: Thought content normal.        Judgment: Judgment normal.    Diabetic Foot Exam - Simple   Simple Foot Form Diabetic Foot exam was performed with the following findings: Yes 03/22/2022 12:01 PM  Visual Inspection No deformities, no ulcerations, no other skin breakdown bilaterally: Yes Sensation Testing Intact to touch and monofilament testing bilaterally: Yes Pulse Check Posterior Tibialis and Dorsalis pulse intact bilaterally: Yes Comments        Results for orders placed or performed in visit on 03/18/22  Hepatitis C antibody  Result Value Ref Range   Hepatitis C Ab NON-REACTIVE NON-REACTIVE  Testosterone  Result Value Ref Range   Testosterone 188.30 (L) 300.00 - 890.00 ng/dL  Lipid panel  Result Value Ref Range   Cholesterol 128 0 - 200 mg/dL   Triglycerides 60.0 0.0 - 149.0 mg/dL   HDL 63.70 >39.00 mg/dL   VLDL 12.0 0.0 - 40.0 mg/dL   LDL Cholesterol 52 0 - 99 mg/dL   Total CHOL/HDL Ratio 2    NonHDL 64.47   Comprehensive metabolic panel  Result Value Ref Range   Sodium 140 135 - 145 mEq/L   Potassium 3.8 3.5 - 5.1 mEq/L   Chloride 103 96 - 112 mEq/L   CO2 24 19 - 32 mEq/L   Glucose, Bld 118 (H) 70 - 99 mg/dL   BUN 12 6 - 23 mg/dL   Creatinine, Ser 1.14 0.40 - 1.50 mg/dL   Total Bilirubin 0.7 0.2 - 1.2 mg/dL   Alkaline Phosphatase 66 39 - 117 U/L   AST 27 0 - 37 U/L   ALT 33 0 - 53 U/L   Total Protein 7.1 6.0 - 8.3 g/dL   Albumin 4.7 3.5 - 5.2 g/dL   GFR 71.70 >60.00 mL/min   Calcium 9.5  8.4 - 10.5 mg/dL  Hemoglobin A1c  Result Value Ref Range   Hgb A1c MFr Bld 6.8 (H) 4.6 - 6.5 %  Microalbumin / creatinine urine ratio  Result Value Ref Range   Microalb, Ur <0.7 0.0 - 1.9 mg/dL   Creatinine,U 133.2 mg/dL   Microalb Creat Ratio 0.5 0.0 - 30.0 mg/g  PSA  Result Value Ref Range   PSA 0.12 0.10 - 4.00 ng/mL    Assessment & Plan:   Problem List Items Addressed This Visit     Health maintenance examination - Primary (Chronic)    Preventative protocols reviewed and updated unless pt declined. Discussed healthy diet and lifestyle.       Type 2 diabetes mellitus with other specified complication (HCC)    Chronic, stable off medication.  Reviewed diet choices to keep good glycemic control.  Encouraged he schedule diabetic eye exam as due.       Hyperlipidemia associated with type 2 diabetes mellitus (HCC)    Chronic, great control on current regimen - continue. The ASCVD Risk score (Arnett DK, et al., 2019) failed to calculate for the following reasons:   The valid total cholesterol range is 130 to 320 mg/dL       Hypogonadism in male    Previously unaffordable/not covered by insurance.  Retrial daily testosterone cream given low T levels.  Reassess at 6 mo f/u visit.       Essential hypertension    Chronic, stable on current regimen - continue       Fuchs' corneal dystrophy    Overdue for eye exam. Advised call and  schedule.         Meds ordered this encounter  Medications   Testosterone 20.25 MG/ACT (1.62%) GEL    Sig: Place 1 Act onto the skin daily.    Dispense:  75 g    Refill:  5   No orders of the defined types were placed in this encounter.   Patient instructions: Consider shingles vaccine.  Call and schedule diabetic eye exam  Retrial testosterone cream - sent to pharmacy. Will see if insurance will cover this.  Good to see you today  Return in 6 months for diabetes follow up visit.   Follow up plan: Return in about 6 months (around  09/21/2022) for follow up visit.  Ria Bush, MD

## 2022-03-22 NOTE — Assessment & Plan Note (Signed)
Overdue for eye exam. Advised call and schedule.

## 2022-03-22 NOTE — Assessment & Plan Note (Signed)
Chronic, stable on current regimen - continue. 

## 2022-03-22 NOTE — Assessment & Plan Note (Addendum)
Preventative protocols reviewed and updated unless pt declined. Discussed healthy diet and lifestyle.  

## 2022-03-22 NOTE — Assessment & Plan Note (Signed)
Chronic, great control on current regimen - continue. The ASCVD Risk score (Arnett DK, et al., 2019) failed to calculate for the following reasons:   The valid total cholesterol range is 130 to 320 mg/dL

## 2022-03-22 NOTE — Assessment & Plan Note (Addendum)
Chronic, stable off medication.  Reviewed diet choices to keep good glycemic control.  Encouraged he schedule diabetic eye exam as due.

## 2022-03-22 NOTE — Assessment & Plan Note (Addendum)
Previously unaffordable/not covered by insurance.  Retrial daily testosterone cream given low T levels.  Reassess at 6 mo f/u visit.

## 2022-03-22 NOTE — Patient Instructions (Addendum)
Consider shingles vaccine.  Call and schedule diabetic eye exam  Retrial testosterone cream - sent to pharmacy. Will see if insurance will cover this.  Good to see you today  Return in 6 months for diabetes follow up visit.   Health Maintenance, Male Adopting a healthy lifestyle and getting preventive care are important in promoting health and wellness. Ask your health care provider about: The right schedule for you to have regular tests and exams. Things you can do on your own to prevent diseases and keep yourself healthy. What should I know about diet, weight, and exercise? Eat a healthy diet  Eat a diet that includes plenty of vegetables, fruits, low-fat dairy products, and lean protein. Do not eat a lot of foods that are high in solid fats, added sugars, or sodium. Maintain a healthy weight Body mass index (BMI) is a measurement that can be used to identify possible weight problems. It estimates body fat based on height and weight. Your health care provider can help determine your BMI and help you achieve or maintain a healthy weight. Get regular exercise Get regular exercise. This is one of the most important things you can do for your health. Most adults should: Exercise for at least 150 minutes each week. The exercise should increase your heart rate and make you sweat (moderate-intensity exercise). Do strengthening exercises at least twice a week. This is in addition to the moderate-intensity exercise. Spend less time sitting. Even light physical activity can be beneficial. Watch cholesterol and blood lipids Have your blood tested for lipids and cholesterol at 57 years of age, then have this test every 5 years. You may need to have your cholesterol levels checked more often if: Your lipid or cholesterol levels are high. You are older than 57 years of age. You are at high risk for heart disease. What should I know about cancer screening? Many types of cancers can be detected early  and may often be prevented. Depending on your health history and family history, you may need to have cancer screening at various ages. This may include screening for: Colorectal cancer. Prostate cancer. Skin cancer. Lung cancer. What should I know about heart disease, diabetes, and high blood pressure? Blood pressure and heart disease High blood pressure causes heart disease and increases the risk of stroke. This is more likely to develop in people who have high blood pressure readings or are overweight. Talk with your health care provider about your target blood pressure readings. Have your blood pressure checked: Every 3-5 years if you are 46-65 years of age. Every year if you are 64 years old or older. If you are between the ages of 82 and 6 and are a current or former smoker, ask your health care provider if you should have a one-time screening for abdominal aortic aneurysm (AAA). Diabetes Have regular diabetes screenings. This checks your fasting blood sugar level. Have the screening done: Once every three years after age 11 if you are at a normal weight and have a low risk for diabetes. More often and at a younger age if you are overweight or have a high risk for diabetes. What should I know about preventing infection? Hepatitis B If you have a higher risk for hepatitis B, you should be screened for this virus. Talk with your health care provider to find out if you are at risk for hepatitis B infection. Hepatitis C Blood testing is recommended for: Everyone born from [birth year] through 1966. Anyone with known risk  factors for hepatitis C. Sexually transmitted infections (STIs) You should be screened each year for STIs, including gonorrhea and chlamydia, if: You are sexually active and are younger than 57 years of age. You are older than 57 years of age and your health care provider tells you that you are at risk for this type of infection. Your sexual activity has changed since you were  last screened, and you are at increased risk for chlamydia or gonorrhea. Ask your health care provider if you are at risk. Ask your health care provider about whether you are at high risk for HIV. Your health care provider may recommend a prescription medicine to help prevent HIV infection. If you choose to take medicine to prevent HIV, you should first get tested for HIV. You should then be tested every 3 months for as long as you are taking the medicine. Follow these instructions at home: Alcohol use Do not drink alcohol if your health care provider tells you not to drink. If you drink alcohol: Limit how much you have to 0-2 drinks a day. Know how much alcohol is in your drink. In the [place] one drink equals one 12 oz bottle of beer (355 mL), one 5 oz glass of wine (148 mL), or one 1 oz glass of hard liquor (44 mL). Lifestyle Do not use any products that contain nicotine or tobacco. These products include cigarettes, chewing tobacco, and vaping devices, such as e-cigarettes. If you need help quitting, ask your health care provider. Do not use street drugs. Do not share needles. Ask your health care provider for help if you need support or information about quitting drugs. General instructions Schedule regular health, dental, and eye exams. Stay current with your vaccines. Tell your health care provider if: You often feel depressed. You have ever been abused or do not feel safe at home. Summary Adopting a healthy lifestyle and getting preventive care are important in promoting health and wellness. Follow your health care provider's instructions about healthy diet, exercising, and getting tested or screened for diseases. Follow your health care provider's instructions on monitoring your cholesterol and blood pressure. This information is not intended to replace advice given to you by your health care provider. Make sure you discuss any questions you have with your health care  provider. Document Revised: 10/16/2020 Document Reviewed: 10/16/2020 Elsevier Patient Education  [place].

## 2022-03-25 ENCOUNTER — Telehealth: Payer: Self-pay

## 2022-03-25 ENCOUNTER — Other Ambulatory Visit: Payer: Self-pay

## 2022-03-25 NOTE — Telephone Encounter (Signed)
Prior auth started and approved for Testosterone 20.25 MG/ACT(1.62%) gel. Marjo Bicker Key: [id number] - [place] Case ID: [id number] - Rx #: [id number]  This letter is to notify you that your prior authorization request for TESTOSTERONE 1.62% GEL PUMP has been approved based upon the information we received from your healthcare practitioner.   The authorization is effective from 03/25/2022 to 03/25/2023, as long as you are enrolled as a member of your current health plan. The request was approved with a quantity restriction. This has been approved for a max daily dosage of 5.  Patient notified via mychart.  Approval letter sent to scanning.

## 2022-03-26 ENCOUNTER — Other Ambulatory Visit: Payer: Self-pay

## 2022-03-28 ENCOUNTER — Encounter: Payer: Self-pay | Admitting: Cardiology

## 2022-03-28 ENCOUNTER — Other Ambulatory Visit: Payer: Self-pay

## 2022-03-28 ENCOUNTER — Ambulatory Visit: Payer: No Typology Code available for payment source | Attending: Cardiology | Admitting: Cardiology

## 2022-03-28 VITALS — BP 142/76 | HR 86 | Ht 71.5 in | Wt 206.0 lb

## 2022-03-28 DIAGNOSIS — I10 Essential (primary) hypertension: Secondary | ICD-10-CM | POA: Diagnosis not present

## 2022-03-28 DIAGNOSIS — E7800 Pure hypercholesterolemia, unspecified: Secondary | ICD-10-CM

## 2022-03-28 DIAGNOSIS — R002 Palpitations: Secondary | ICD-10-CM

## 2022-03-28 MED ORDER — AMLODIPINE BESYLATE 10 MG PO TABS
10.0000 mg | ORAL_TABLET | Freq: Every day | ORAL | 1 refills | Status: DC
  Filled 2022-03-28: qty 90, 90d supply, fill #0
  Filled 2022-06-18: qty 90, 90d supply, fill #1

## 2022-03-28 NOTE — Progress Notes (Signed)
Cardiology Office Note:    Date:  03/28/2022   ID:  Gerald Collins, DOB 08-26-1965, MRN [id number]  PCP:  Ria Bush, MD   [facility] Providers Cardiologist:  Kate Sable, MD     Referring MD: Ria Bush, MD   Chief Complaint  Patient presents with   Follow-up    HTN, no cardiac concerns     History of Present Illness:    Gerald Collins is a 56 y.o. male with a hx of hypertension, hyperlipidemia, diabetes who presents for follow-up.  Being seen for hypertension and medication management.  Losartan previously increased to 100 mg daily.  Still endorse occasionally eating salty foods.  Blood pressures at home are typically in the 130s.  Endorses being compliant with medications as prescribed.  Feels well otherwise.    Past Medical History:  Diagnosis Date   Diabetes type 2, controlled ([place]) 2012   controlled with lifestyle (diet, exercise) change   H/O: pneumonia 06/11/1987   HLD (hyperlipidemia)    HTN (hypertension)    no meds    Other specified visual disturbances     Past Surgical History:  Procedure Laterality Date   COLONOSCOPY  09/2015   hyperplastic polpy rpt 10 yrs Henrene Pastor)   SHOULDER ARTHROSCOPY WITH OPEN ROTATOR CUFF REPAIR Right 04/01/2019   RIGHT SHOULDER ARTHROSCOPY, ARTHROSCOPIC LABRAL REPAIR, SUBACROMIAL DECOMPRESSION, DISTAL CLAVICLE EXCISION, MINI OPEN ROTATOR CUFF REPAIR;  Thornton Park, MD   VASECTOMY  1998    Current Medications: Current Meds  Medication Sig   atorvastatin (LIPITOR) 40 MG tablet TAKE 1 TABLET (40 MG TOTAL) BY MOUTH DAILY.   azelastine (ASTELIN) 0.1 % nasal spray Place 2 sprays into both nostrils 2 (two) times daily.   Blood Glucose Monitoring Suppl (FREESTYLE FREEDOM LITE) w/Device KIT Use up to four times daily as directed.   fluticasone (FLONASE) 50 MCG/ACT nasal spray 2 sprays in each nostril once daily   glucose blood test strip Use up to four times daily as directed   Lancets (FREESTYLE) lancets Use  up to 4 times daily   Lancets (FREESTYLE) lancets Use up to four times daily as directed   losartan (COZAAR) 100 MG tablet Take 1 tablet (100 mg total) by mouth daily.   Testosterone 20.25 MG/ACT (1.62%) GEL Place 1 pump onto the skin daily.   vitamin C (ASCORBIC ACID) 500 MG tablet Take 500 mg by mouth daily.   [DISCONTINUED] amLODipine (NORVASC) 5 MG tablet TAKE 1 TABLET (5 MG TOTAL) BY MOUTH DAILY.     Allergies:   Latex and Metformin   Social History   Socioeconomic History   Marital status: Married    Spouse name: Not on file   Number of children: 2   Years of education: Not on file   Highest education level: Not on file  Occupational History   Occupation: LabTech    Employer: [employer]  Tobacco Use   Smoking status: Former    Types: Cigars   Smokeless tobacco: Never   Tobacco comments:    on occasion only-never regularly  Vaping Use   Vaping Use: Never used  Substance and Sexual Activity   Alcohol use: Yes    Alcohol/week: 0.0 standard drinks of alcohol    Comment: Rare   Drug use: No   Sexual activity: Not on file  Other Topics Concern   Not on file  Social History Narrative   Caffeine: 0   Lives with wife Investment banker, corporate), 2 kids, no pets   Lab  Tech at [employer]   Activity: 1-2 hours every morning combination of weights and aerobic exercise   Diet: good water, fruits/vegetables daily   Social Determinants of Health   Financial Resource Strain: Not on file  Food Insecurity: Not on file  Transportation Needs: Not on file  Physical Activity: Not on file  Stress: Not on file  Social Connections: Not on file     Family History: The patient's family history includes Cancer in his maternal uncle and mother; Colon polyps in his maternal aunt and mother; Coronary artery disease (age of onset: 58) in his maternal grandfather; Healthy in his father; Hyperlipidemia in his mother; Hypertension in his mother; Other in his brother.  ROS:   Please see the history of present  illness.     All other systems reviewed and are negative.  EKGs/Labs/Other Studies Reviewed:    The following studies were reviewed today:   EKG:  EKG is ordered today.  EKG shows normal sinus rhythm, normal ECG.  Recent Labs: 10/29/2021: Hemoglobin 14.0; Platelets 151 11/06/2021: Magnesium 1.9; TSH 0.97 03/18/2022: ALT 33; BUN 12; Creatinine, Ser 1.14; Potassium 3.8; Sodium 140  Recent Lipid Panel    Component Value Date/Time   CHOL 128 03/18/2022 0939   CHOL 162 05/22/2021 1205   CHOL 188 11/08/2010 0000   TRIG 60.0 03/18/2022 0939   TRIG 101 11/08/2010 0000   HDL 63.70 03/18/2022 0939   HDL 56 05/22/2021 1205   CHOLHDL 2 03/18/2022 0939   VLDL 12.0 03/18/2022 0939   LDLCALC 52 03/18/2022 0939   LDLCALC 83 05/22/2021 1205   LDLDIRECT 117 11/08/2010 0000     Risk Assessment/Calculations:         Physical Exam:    VS:  BP (!) 142/76 (BP Location: Left Arm, Patient Position: Sitting)   Pulse 86   Ht 5' 11.5" (1.816 m)   Wt 206 lb (93.4 kg)   SpO2 97%   BMI 28.33 kg/m     Wt Readings from Last 3 Encounters:  03/28/22 206 lb (93.4 kg)  03/22/22 206 lb 6 oz (93.6 kg)  12/21/21 213 lb 12.8 oz (97 kg)     GEN:  Well nourished, well developed in no acute distress HEENT: Normal NECK: No JVD; No carotid bruits CARDIAC: RRR, no murmurs, rubs, gallops RESPIRATORY:  Clear to auscultation without rales, wheezing or rhonchi  ABDOMEN: Soft, non-tender, non-distended MUSCULOSKELETAL:  No edema; No deformity  SKIN: Warm and dry NEUROLOGIC:  Alert and oriented x 3 PSYCHIATRIC:  Normal affect   ASSESSMENT:    1. Palpitations   2. Primary hypertension   3. Pure hypercholesterolemia    PLAN:    In order of problems listed above:  Palpitations, cardiac monitor showed occasional paroxysmal SVT, symptoms better with stress reduction.  Continue to monitor off medications.  Previously declined beta-blockers. Hypertension, BP elevated.  Increase amlodipine to 10 mg daily.   Continue losartan 100 mg daily.  Hyperlipidemia, cholesterol controlled, continue Lipitor.  Follow-up in 4 to 5 months      Medication Adjustments/Labs and Tests Ordered: Current medicines are reviewed at length with the patient today.  Concerns regarding medicines are outlined above.  No orders of the defined types were placed in this encounter.  Meds ordered this encounter  Medications   amLODipine (NORVASC) 10 MG tablet    Sig: Take 1 tablet (10 mg total) by mouth daily.    Dispense:  90 tablet    Refill:  1    Patient  Instructions  Medication Instructions:   Your physician has recommended you make the following change in your medication:    INCREASE your Amlodipine to 10 MG once a day.  *If you need a refill on your cardiac medications before your next appointment, please call your pharmacy*    Follow-Up: At [facility], you and your health needs are our priority.  As part of our continuing mission to provide you with exceptional heart care, we have created designated Provider Care Teams.  These Care Teams include your primary Cardiologist (physician) and Advanced Practice Providers (APPs -  Physician Assistants and Nurse Practitioners) who all work together to provide you with the care you need, when you need it.  We recommend signing up for the patient portal called "MyChart".  Sign up information is provided on this After Visit Summary.  MyChart is used to connect with patients for Virtual Visits (Telemedicine).  Patients are able to view lab/test results, encounter notes, upcoming appointments, etc.  Non-urgent messages can be sent to your provider as well.   To learn more about what you can do with MyChart, go to [email or web address].    Your next appointment:   4 month(s)  The format for your next appointment:   In Person  Provider:   Kate Sable, MD    Other Instructions   Important Information About Sugar         Signed, Kate Sable, MD  03/28/2022 2:02 PM    [place]

## 2022-03-28 NOTE — Addendum Note (Signed)
Addended by: Nestor Ramp on: 03/28/2022 04:41 PM   Modules accepted: Orders

## 2022-03-28 NOTE — Patient Instructions (Signed)
Medication Instructions:   Your physician has recommended you make the following change in your medication:    INCREASE your Amlodipine to 10 MG once a day.  *If you need a refill on your cardiac medications before your next appointment, please call your pharmacy*    Follow-Up: At [facility], you and your health needs are our priority.  As part of our continuing mission to provide you with exceptional heart care, we have created designated Provider Care Teams.  These Care Teams include your primary Cardiologist (physician) and Advanced Practice Providers (APPs -  Physician Assistants and Nurse Practitioners) who all work together to provide you with the care you need, when you need it.  We recommend signing up for the patient portal called "MyChart".  Sign up information is provided on this After Visit Summary.  MyChart is used to connect with patients for Virtual Visits (Telemedicine).  Patients are able to view lab/test results, encounter notes, upcoming appointments, etc.  Non-urgent messages can be sent to your provider as well.   To learn more about what you can do with MyChart, go to [email or web address].    Your next appointment:   4 month(s)  The format for your next appointment:   In Person  Provider:   Kate Sable, MD    Other Instructions   Important Information About Sugar

## 2022-04-22 ENCOUNTER — Other Ambulatory Visit: Payer: Self-pay

## 2022-05-07 ENCOUNTER — Other Ambulatory Visit: Payer: No Typology Code available for payment source

## 2022-05-14 ENCOUNTER — Encounter: Payer: No Typology Code available for payment source | Admitting: Family Medicine

## 2022-05-15 ENCOUNTER — Other Ambulatory Visit: Payer: Self-pay | Admitting: Family Medicine

## 2022-05-15 ENCOUNTER — Other Ambulatory Visit: Payer: Self-pay

## 2022-05-15 DIAGNOSIS — E1169 Type 2 diabetes mellitus with other specified complication: Secondary | ICD-10-CM

## 2022-05-17 ENCOUNTER — Other Ambulatory Visit: Payer: Self-pay

## 2022-05-17 MED ORDER — FREESTYLE LITE TEST VI STRP
ORAL_STRIP | 3 refills | Status: DC
  Filled 2022-05-17: qty 100, 90d supply, fill #0
  Filled 2022-06-11 – 2022-08-10 (×3): qty 100, 90d supply, fill #1
  Filled 2022-11-18: qty 100, 90d supply, fill #2
  Filled 2023-01-30: qty 100, 90d supply, fill #3

## 2022-05-20 ENCOUNTER — Other Ambulatory Visit: Payer: Self-pay

## 2022-05-21 ENCOUNTER — Other Ambulatory Visit: Payer: Self-pay | Admitting: Family Medicine

## 2022-05-21 ENCOUNTER — Other Ambulatory Visit: Payer: Self-pay

## 2022-05-21 MED FILL — Fluticasone Propionate Nasal Susp 50 MCG/ACT: NASAL | 30 days supply | Qty: 16 | Fill #0 | Status: AC

## 2022-05-22 ENCOUNTER — Other Ambulatory Visit: Payer: Self-pay

## 2022-06-11 ENCOUNTER — Other Ambulatory Visit: Payer: Self-pay

## 2022-06-18 ENCOUNTER — Other Ambulatory Visit: Payer: Self-pay

## 2022-06-18 ENCOUNTER — Other Ambulatory Visit: Payer: Self-pay | Admitting: Family Medicine

## 2022-06-18 DIAGNOSIS — E1169 Type 2 diabetes mellitus with other specified complication: Secondary | ICD-10-CM

## 2022-06-18 MED ORDER — LOSARTAN POTASSIUM 100 MG PO TABS
100.0000 mg | ORAL_TABLET | Freq: Every day | ORAL | 5 refills | Status: DC
  Filled 2022-06-18: qty 30, 30d supply, fill #0
  Filled 2022-07-22: qty 30, 30d supply, fill #1
  Filled 2022-08-20: qty 30, 30d supply, fill #2
  Filled 2022-09-16: qty 30, 30d supply, fill #3
  Filled 2022-10-23: qty 30, 30d supply, fill #4
  Filled 2022-11-18: qty 30, 30d supply, fill #5

## 2022-06-18 MED FILL — Atorvastatin Calcium Tab 40 MG (Base Equivalent): ORAL | 90 days supply | Qty: 90 | Fill #0 | Status: AC

## 2022-06-21 ENCOUNTER — Other Ambulatory Visit: Payer: Self-pay

## 2022-07-22 ENCOUNTER — Other Ambulatory Visit: Payer: Self-pay

## 2022-07-29 ENCOUNTER — Ambulatory Visit: Payer: 59 | Attending: Cardiology | Admitting: Cardiology

## 2022-07-29 ENCOUNTER — Encounter: Payer: Self-pay | Admitting: Cardiology

## 2022-07-29 VITALS — BP 136/74 | HR 64 | Ht 72.0 in | Wt 210.2 lb

## 2022-07-29 DIAGNOSIS — I10 Essential (primary) hypertension: Secondary | ICD-10-CM

## 2022-07-29 DIAGNOSIS — R002 Palpitations: Secondary | ICD-10-CM

## 2022-07-29 DIAGNOSIS — E7800 Pure hypercholesterolemia, unspecified: Secondary | ICD-10-CM | POA: Diagnosis not present

## 2022-07-29 NOTE — Patient Instructions (Signed)
Medication Instructions:   Your physician recommends that you continue on your current medications as directed. Please refer to the Current Medication list given to you today.  *If you need a refill on your cardiac medications before your next appointment, please call your pharmacy*   Lab Work:  None Ordered  If you have labs (blood work) drawn today and your tests are completely normal, you will receive your results only by: [place] (if you have MyChart) OR A paper copy in the mail If you have any lab test that is abnormal or we need to change your treatment, we will call you to review the results.   Testing/Procedures:  None Ordered   Follow-Up: At [facility], you and your health needs are our priority.  As part of our continuing mission to provide you with exceptional heart care, we have created designated Provider Care Teams.  These Care Teams include your primary Cardiologist (physician) and Advanced Practice Providers (APPs -  Physician Assistants and Nurse Practitioners) who all work together to provide you with the care you need, when you need it.  We recommend signing up for the patient portal called "MyChart".  Sign up information is provided on this After Visit Summary.  MyChart is used to connect with patients for Virtual Visits (Telemedicine).  Patients are able to view lab/test results, encounter notes, upcoming appointments, etc.  Non-urgent messages can be sent to your provider as well.   To learn more about what you can do with MyChart, go to [email or web address].    Your next appointment:    AS NEEDED

## 2022-07-29 NOTE — Progress Notes (Signed)
Cardiology Office Note:    Date:  07/29/2022   ID:  Gerald Collins, DOB Mar 22, 1966, MRN [id number]  PCP:  Ria Bush, MD   [facility] Providers Cardiologist:  Kate Sable, MD     Referring MD: Ria Bush, MD   Chief Complaint  Patient presents with   Follow-up    4 month f/u, no ;cardiac concerns     History of Present Illness:    Gerald Collins is a 57 y.o. male with a hx of hypertension, hyperlipidemia, diabetes who presents for follow-up.  Being seen for hypertension and palpitations.  Symptoms of palpitations improved with stress reduction.  Norvasc previously increased to 10 mg daily.  Low-salt diet also recommended.  Takes medications as prescribed.  No new concerns at this time.  Checks blood pressure frequently at home, usually around [id number] systolic.   Past Medical History:  Diagnosis Date   Diabetes type 2, controlled ([place]) 2012   controlled with lifestyle (diet, exercise) change   H/O: pneumonia 06/11/1987   HLD (hyperlipidemia)    HTN (hypertension)    no meds    Other specified visual disturbances     Past Surgical History:  Procedure Laterality Date   COLONOSCOPY  09/2015   hyperplastic polpy rpt 10 yrs Henrene Pastor)   SHOULDER ARTHROSCOPY WITH OPEN ROTATOR CUFF REPAIR Right 04/01/2019   RIGHT SHOULDER ARTHROSCOPY, ARTHROSCOPIC LABRAL REPAIR, SUBACROMIAL DECOMPRESSION, DISTAL CLAVICLE EXCISION, MINI OPEN ROTATOR CUFF REPAIR;  Thornton Park, MD   VASECTOMY  1998    Current Medications: Current Meds  Medication Sig   amLODipine (NORVASC) 10 MG tablet Take 1 tablet (10 mg total) by mouth daily.   atorvastatin (LIPITOR) 40 MG tablet TAKE 1 TABLET (40 MG TOTAL) BY MOUTH DAILY.   azelastine (ASTELIN) 0.1 % nasal spray Place 2 sprays into both nostrils 2 (two) times daily.   Blood Glucose Monitoring Suppl (FREESTYLE FREEDOM LITE) w/Device KIT Use up to four times daily as directed.   fluticasone (FLONASE) 50 MCG/ACT nasal spray 2 sprays  in each nostril once daily   glucose blood (FREESTYLE LITE) test strip Use as directed to blood sugar once a day   Lancets (FREESTYLE) lancets Use up to four times daily as directed   losartan (COZAAR) 100 MG tablet Take 1 tablet (100 mg total) by mouth daily.   Testosterone 20.25 MG/ACT (1.62%) GEL Place 1 pump onto the skin daily.   vitamin C (ASCORBIC ACID) 500 MG tablet Take 500 mg by mouth daily.     Allergies:   Latex and Metformin   Social History   Socioeconomic History   Marital status: Married    Spouse name: Not on file   Number of children: 2   Years of education: Not on file   Highest education level: Not on file  Occupational History   Occupation: LabTech    Employer: [employer]  Tobacco Use   Smoking status: Former    Types: Cigars   Smokeless tobacco: Never   Tobacco comments:    on occasion only-never regularly  Vaping Use   Vaping Use: Never used  Substance and Sexual Activity   Alcohol use: Yes    Alcohol/week: 0.0 standard drinks of alcohol    Comment: Rare   Drug use: No   Sexual activity: Not on file  Other Topics Concern   Not on file  Social History Narrative   Caffeine: 0   Lives with wife Investment banker, corporate), 2 kids, no pets   Lab Tech at  [employer]   Activity: 1-2 hours every morning combination of weights and aerobic exercise   Diet: good water, fruits/vegetables daily   Social Determinants of Health   Financial Resource Strain: Not on file  Food Insecurity: Not on file  Transportation Needs: Not on file  Physical Activity: Not on file  Stress: Not on file  Social Connections: Not on file     Family History: The patient's family history includes Cancer in his maternal uncle and mother; Colon polyps in his maternal aunt and mother; Coronary artery disease (age of onset: 43) in his maternal grandfather; Healthy in his father; Hyperlipidemia in his mother; Hypertension in his mother; Other in his brother.  ROS:   Please see the history of present  illness.     All other systems reviewed and are negative.  EKGs/Labs/Other Studies Reviewed:    The following studies were reviewed today:   EKG:  EKG is ordered today.  EKG shows normal sinus rhythm, normal ECG.  Recent Labs: 10/29/2021: Hemoglobin 14.0; Platelets 151 11/06/2021: Magnesium 1.9; TSH 0.97 03/18/2022: ALT 33; BUN 12; Creatinine, Ser 1.14; Potassium 3.8; Sodium 140  Recent Lipid Panel    Component Value Date/Time   CHOL 128 03/18/2022 0939   CHOL 162 05/22/2021 1205   CHOL 188 11/08/2010 0000   TRIG 60.0 03/18/2022 0939   TRIG 101 11/08/2010 0000   HDL 63.70 03/18/2022 0939   HDL 56 05/22/2021 1205   CHOLHDL 2 03/18/2022 0939   VLDL 12.0 03/18/2022 0939   LDLCALC 52 03/18/2022 0939   LDLCALC 83 05/22/2021 1205   LDLDIRECT 117 11/08/2010 0000     Risk Assessment/Calculations:         Physical Exam:    VS:  BP 136/74 (BP Location: Left Arm, Patient Position: Sitting, Cuff Size: Large)   Pulse 64   Ht 6' (1.829 m)   Wt 210 lb 3.2 oz (95.3 kg)   SpO2 99%   BMI 28.51 kg/m     Wt Readings from Last 3 Encounters:  07/29/22 210 lb 3.2 oz (95.3 kg)  03/28/22 206 lb (93.4 kg)  03/22/22 206 lb 6 oz (93.6 kg)     GEN:  Well nourished, well developed in no acute distress HEENT: Normal NECK: No JVD; No carotid bruits CARDIAC: RRR, no murmurs, rubs, gallops RESPIRATORY:  Clear to auscultation without rales, wheezing or rhonchi  ABDOMEN: Soft, non-tender, non-distended MUSCULOSKELETAL:  No edema; No deformity  SKIN: Warm and dry NEUROLOGIC:  Alert and oriented x 3 PSYCHIATRIC:  Normal affect   ASSESSMENT:    1. Palpitations   2. Primary hypertension   3. Pure hypercholesterolemia    PLAN:    In order of problems listed above:  Palpitations, symptoms improved with stress reduction.  Cardiac monitor showed occasional paroxysmal SVT.no significant sustained arrhythmias, no significant heart block.  Continue to monitor off medications.  Hypertension,  BP now well-controlled..  Continue amlodipine to 10 mg daily.  Continue losartan 100 mg daily.  Adherence to low-salt diet advised. Hyperlipidemia, cholesterol controlled, continue Lipitor.  Follow-up as needed.    Medication Adjustments/Labs and Tests Ordered: Current medicines are reviewed at length with the patient today.  Concerns regarding medicines are outlined above.  No orders of the defined types were placed in this encounter.  No orders of the defined types were placed in this encounter.   Patient Instructions  Medication Instructions:   Your physician recommends that you continue on your current medications as directed. Please refer to the  Current Medication list given to you today.  *If you need a refill on your cardiac medications before your next appointment, please call your pharmacy*   Lab Work:  None Ordered  If you have labs (blood work) drawn today and your tests are completely normal, you will receive your results only by: [place] (if you have MyChart) OR A paper copy in the mail If you have any lab test that is abnormal or we need to change your treatment, we will call you to review the results.   Testing/Procedures:  None Ordered   Follow-Up: At [facility], you and your health needs are our priority.  As part of our continuing mission to provide you with exceptional heart care, we have created designated Provider Care Teams.  These Care Teams include your primary Cardiologist (physician) and Advanced Practice Providers (APPs -  Physician Assistants and Nurse Practitioners) who all work together to provide you with the care you need, when you need it.  We recommend signing up for the patient portal called "MyChart".  Sign up information is provided on this After Visit Summary.  MyChart is used to connect with patients for Virtual Visits (Telemedicine).  Patients are able to view lab/test results, encounter notes, upcoming appointments,  etc.  Non-urgent messages can be sent to your provider as well.   To learn more about what you can do with MyChart, go to [email or web address].    Your next appointment:    AS NEEDED      Signed, Kate Sable, MD  07/29/2022 10:47 AM    [place]

## 2022-09-16 MED FILL — Atorvastatin Calcium Tab 40 MG (Base Equivalent): ORAL | 90 days supply | Qty: 90 | Fill #1 | Status: AC

## 2022-09-17 ENCOUNTER — Other Ambulatory Visit: Payer: Self-pay

## 2022-09-24 ENCOUNTER — Ambulatory Visit: Payer: No Typology Code available for payment source | Admitting: Family Medicine

## 2022-09-29 ENCOUNTER — Other Ambulatory Visit: Payer: Self-pay

## 2022-09-29 ENCOUNTER — Encounter: Payer: Self-pay | Admitting: Family Medicine

## 2022-09-30 ENCOUNTER — Other Ambulatory Visit: Payer: Self-pay

## 2022-09-30 MED ORDER — AMLODIPINE BESYLATE 10 MG PO TABS
10.0000 mg | ORAL_TABLET | Freq: Every day | ORAL | 3 refills | Status: DC
  Filled 2022-09-30: qty 90, 90d supply, fill #0
  Filled 2022-10-23 – 2022-12-24 (×2): qty 90, 90d supply, fill #1
  Filled 2023-03-22: qty 90, 90d supply, fill #2
  Filled 2023-06-17: qty 90, 90d supply, fill #3

## 2022-10-02 ENCOUNTER — Ambulatory Visit (INDEPENDENT_AMBULATORY_CARE_PROVIDER_SITE_OTHER): Payer: 59 | Admitting: Family Medicine

## 2022-10-02 ENCOUNTER — Encounter: Payer: Self-pay | Admitting: Family Medicine

## 2022-10-02 ENCOUNTER — Other Ambulatory Visit: Payer: Self-pay

## 2022-10-02 VITALS — BP 132/70 | HR 71 | Temp 97.6°F | Ht 72.0 in | Wt 206.5 lb

## 2022-10-02 DIAGNOSIS — E1169 Type 2 diabetes mellitus with other specified complication: Secondary | ICD-10-CM

## 2022-10-02 DIAGNOSIS — I10 Essential (primary) hypertension: Secondary | ICD-10-CM | POA: Diagnosis not present

## 2022-10-02 DIAGNOSIS — R011 Cardiac murmur, unspecified: Secondary | ICD-10-CM | POA: Diagnosis not present

## 2022-10-02 DIAGNOSIS — E291 Testicular hypofunction: Secondary | ICD-10-CM | POA: Diagnosis not present

## 2022-10-02 LAB — POCT GLYCOSYLATED HEMOGLOBIN (HGB A1C): Hemoglobin A1C: 6 % — AB (ref 4.0–5.6)

## 2022-10-02 MED ORDER — TESTOSTERONE 20.25 MG/ACT (1.62%) TD GEL
1.0000 | Freq: Every day | TRANSDERMAL | 5 refills | Status: DC
  Filled 2022-10-02: qty 75, 90d supply, fill #0

## 2022-10-02 NOTE — Assessment & Plan Note (Signed)
Minimal, heard today. Will continue to monitor.

## 2022-10-02 NOTE — Assessment & Plan Note (Signed)
Secondary hypogonadism - now on testosterone cream replacement 1 pump/day Will return for 8am T levels.

## 2022-10-02 NOTE — Progress Notes (Signed)
Ph: [phone]       Fax: [phone]   Patient ID: Gerald Collins, male    DOB: May 26, 1966, 57 y.o.   MRN: [id number]  This visit was conducted in person.  BP 132/70 (BP Location: Right Arm, Cuff Size: Large)   Pulse 71   Temp 97.6 F (36.4 C) (Temporal)   Ht 6' (1.829 m)   Wt 206 lb 8 oz (93.7 kg)   SpO2 97%   BMI 28.01 kg/m   BP Readings from Last 3 Encounters:  10/02/22 132/70  07/29/22 136/74  03/28/22 (!) 142/76    CC: 6 mo DM f/u visit  Subjective:   HPI: Gerald Collins is a 57 y.o. male presenting on 10/02/2022 for Medical Management of Chronic Issues (Here for 6 mo DM f/u. )   Saw cardiology Dr Azucena Cecil last 07/2022 for palpitations - cardiac monitor showed occ parox SVT without sustained arrhythmias.   Hypogonadism - started testosterone replacement currently on 20.25mg /act 1 pump daily. Tolerating well and feels more energy, improved sex drive. Walks 4 mi/runs 1 mi every day except Sundays.   HTN - continues full dose amlodipine and losartan. Missed a week of amlodipine recently when he took wrong med on cruise. He's since restarted amlodipine. Home BP readings run 120-130 systolic. Limits salt/sodium in diet, limits caffeine. Denies HA, vision changes, CP/tightness, SOB, leg swelling.   He notes astelin nasal spray raised sugar levels.   DM - does regularly check sugars once daily fasting - 98-108. Compliant with antihyperglycemic regimen which includes: diet controlled. Denies low sugars or hypoglycemic symptoms. Denies paresthesias, blurry vision. Last diabetic eye exam 2021 - due. Glucometer brand: freestyle freedom Lite. Last foot exam: 03/2022. DSME: completed remotely.  Lab Results  Component Value Date   HGBA1C 6.0 (A) 10/02/2022   Diabetic Foot Exam - Simple   Simple Foot Form Diabetic Foot exam was performed with the following findings: Yes 10/02/2022 11:43 AM  Visual Inspection No deformities, no ulcerations, no other skin breakdown  bilaterally: Yes Sensation Testing Intact to touch and monofilament testing bilaterally: Yes Pulse Check Posterior Tibialis and Dorsalis pulse intact bilaterally: Yes Comments No claudication symptoms    Lab Results  Component Value Date   MICROALBUR <0.7 03/18/2022        Relevant past medical, surgical, family and social history reviewed and updated as indicated. Interim medical history since our last visit reviewed. Allergies and medications reviewed and updated. Outpatient Medications Prior to Visit  Medication Sig Dispense Refill   amLODipine (NORVASC) 10 MG tablet Take 1 tablet (10 mg total) by mouth daily. 90 tablet 3   atorvastatin (LIPITOR) 40 MG tablet TAKE 1 TABLET (40 MG TOTAL) BY MOUTH DAILY. 90 tablet 3   azelastine (ASTELIN) 0.1 % nasal spray Place 2 sprays into both nostrils 2 (two) times daily. 30 mL 3   Blood Glucose Monitoring Suppl (FREESTYLE FREEDOM LITE) w/Device KIT Use up to four times daily as directed. 1 kit 0   fluticasone (FLONASE) 50 MCG/ACT nasal spray 2 sprays in each nostril once daily 16 g 10   glucose blood (FREESTYLE LITE) test strip Use as directed to blood sugar once a day 100 each 3   Lancets (FREESTYLE) lancets Use up to four times daily as directed 100 each 0   losartan (COZAAR) 100 MG tablet Take 1 tablet (100 mg total) by mouth daily. 30 tablet 5   vitamin C (ASCORBIC ACID) 500 MG tablet Take 500 mg by mouth  daily.     Testosterone 20.25 MG/ACT (1.62%) GEL Place 1 pump onto the skin daily. 75 g 5   No facility-administered medications prior to visit.     Per HPI unless specifically indicated in ROS section below Review of Systems  Objective:  BP 132/70 (BP Location: Right Arm, Cuff Size: Large)   Pulse 71   Temp 97.6 F (36.4 C) (Temporal)   Ht 6' (1.829 m)   Wt 206 lb 8 oz (93.7 kg)   SpO2 97%   BMI 28.01 kg/m   Wt Readings from Last 3 Encounters:  10/02/22 206 lb 8 oz (93.7 kg)  07/29/22 210 lb 3.2 oz (95.3 kg)  03/28/22 206  lb (93.4 kg)      Physical Exam Vitals and nursing note reviewed.  Constitutional:      Appearance: Normal appearance. He is not ill-appearing.  HENT:     Mouth/Throat:     Mouth: Mucous membranes are moist.     Pharynx: Oropharynx is clear. No oropharyngeal exudate or posterior oropharyngeal erythema.  Eyes:     Extraocular Movements: Extraocular movements intact.     Conjunctiva/sclera: Conjunctivae normal.     Pupils: Pupils are equal, round, and reactive to light.  Neck:     Thyroid: No thyroid mass or thyromegaly.  Cardiovascular:     Rate and Rhythm: Normal rate and regular rhythm.     Pulses: Normal pulses.     Heart sounds: Murmur (2/6 systolic) heard.  Pulmonary:     Effort: Pulmonary effort is normal. No respiratory distress.     Breath sounds: Normal breath sounds. No wheezing, rhonchi or rales.  Musculoskeletal:     Cervical back: Normal range of motion and neck supple. No rigidity.     Right lower leg: No edema.     Left lower leg: No edema.     Comments: See HPI for foot exam if done  Lymphadenopathy:     Cervical: No cervical adenopathy.  Skin:    General: Skin is warm and dry.     Findings: No rash.  Neurological:     Mental Status: He is alert.  Psychiatric:        Mood and Affect: Mood normal.        Behavior: Behavior normal.       Results for orders placed or performed in visit on 10/02/22  POCT glycosylated hemoglobin (Hb A1C)  Result Value Ref Range   Hemoglobin A1C 6.0 (A) 4.0 - 5.6 %   HbA1c POC (<> result, Gerald entry)     HbA1c, POC (prediabetic range)     HbA1c, POC (controlled diabetic range)      Assessment & Plan:   Problem List Items Addressed This Visit     Type 2 diabetes mellitus with other specified complication - Primary    Chronic, great control off medications with latest A1c actually back in prediabetes range.  Continue healthy diet and lifestyle choices.       Relevant Orders   POCT glycosylated hemoglobin (Hb A1C)  (Completed)   Hypogonadism in male    Secondary hypogonadism - now on testosterone cream replacement 1 pump/day Will return for 8am T levels.       Relevant Orders   CBC with Differential/Platelet   Comprehensive metabolic panel   Testosterone   Lipid panel   T4, free   Essential hypertension    Chronic, well controlled on current regimen - continue       Systolic murmur  Minimal, heard today. Will continue to monitor.         Meds ordered this encounter  Medications   Testosterone 20.25 MG/ACT (1.62%) GEL    Sig: Place 1 pump onto the skin daily.    Dispense:  75 g    Refill:  5    Orders Placed This Encounter  Procedures   CBC with Differential/Platelet    Standing Status:   Future    Standing Expiration Date:   10/02/2023   Comprehensive metabolic panel    Standing Status:   Future    Standing Expiration Date:   10/02/2023   Testosterone    Standing Status:   Future    Standing Expiration Date:   10/02/2023   Lipid panel    Standing Status:   Future    Standing Expiration Date:   10/02/2023   T4, free    Standing Status:   Future    Standing Expiration Date:   10/02/2023   POCT glycosylated hemoglobin (Hb A1C)    Patient Instructions  Schedule diabetic eye exam.  Return for 8am labs one morning at your convenience to check testosterone. Good to see you today Return as needed or in 6 months for physical.   Follow up plan: Return in about 6 months (around 04/03/2023) for annual exam, prior fasting for blood work.  Eustaquio Boyden, MD

## 2022-10-02 NOTE — Assessment & Plan Note (Signed)
Chronic, great control off medications with latest A1c actually back in prediabetes range.  Continue healthy diet and lifestyle choices.

## 2022-10-02 NOTE — Assessment & Plan Note (Signed)
Chronic, well controlled on current regimen - continue.  

## 2022-10-02 NOTE — Patient Instructions (Addendum)
Schedule diabetic eye exam.  Return for 8am labs one morning at your convenience to check testosterone. Good to see you today Return as needed or in 6 months for physical.

## 2022-10-04 ENCOUNTER — Other Ambulatory Visit: Payer: Self-pay

## 2022-10-05 ENCOUNTER — Other Ambulatory Visit: Payer: Self-pay | Admitting: Family Medicine

## 2022-10-10 ENCOUNTER — Other Ambulatory Visit (INDEPENDENT_AMBULATORY_CARE_PROVIDER_SITE_OTHER): Payer: 59

## 2022-10-10 DIAGNOSIS — E291 Testicular hypofunction: Secondary | ICD-10-CM | POA: Diagnosis not present

## 2022-10-10 LAB — CBC WITH DIFFERENTIAL/PLATELET
Basophils Absolute: 0 10*3/uL (ref 0.0–0.1)
Basophils Relative: 0.7 % (ref 0.0–3.0)
Eosinophils Absolute: 0.1 10*3/uL (ref 0.0–0.7)
Eosinophils Relative: 1.8 % (ref 0.0–5.0)
HCT: 39.1 % (ref 39.0–52.0)
Hemoglobin: 13 g/dL (ref 13.0–17.0)
Lymphocytes Relative: 52.1 % — ABNORMAL HIGH (ref 12.0–46.0)
Lymphs Abs: 1.6 10*3/uL (ref 0.7–4.0)
MCHC: 33.1 g/dL (ref 30.0–36.0)
MCV: 83.8 fl (ref 78.0–100.0)
Monocytes Absolute: 0.3 10*3/uL (ref 0.1–1.0)
Monocytes Relative: 8.2 % (ref 3.0–12.0)
Neutro Abs: 1.1 10*3/uL — ABNORMAL LOW (ref 1.4–7.7)
Neutrophils Relative %: 37.2 % — ABNORMAL LOW (ref 43.0–77.0)
Platelets: 171 10*3/uL (ref 150.0–400.0)
RBC: 4.67 Mil/uL (ref 4.22–5.81)
RDW: 13.1 % (ref 11.5–15.5)
WBC: 3.1 10*3/uL — ABNORMAL LOW (ref 4.0–10.5)

## 2022-10-10 LAB — COMPREHENSIVE METABOLIC PANEL
ALT: 24 U/L (ref 0–53)
AST: 25 U/L (ref 0–37)
Albumin: 4.5 g/dL (ref 3.5–5.2)
Alkaline Phosphatase: 71 U/L (ref 39–117)
BUN: 13 mg/dL (ref 6–23)
CO2: 27 mEq/L (ref 19–32)
Calcium: 9.3 mg/dL (ref 8.4–10.5)
Chloride: 102 mEq/L (ref 96–112)
Creatinine, Ser: 1.18 mg/dL (ref 0.40–1.50)
GFR: 68.52 mL/min (ref >60.00)
Glucose, Bld: 152 mg/dL — ABNORMAL HIGH (ref 70–99)
Potassium: 4 mEq/L (ref 3.5–5.1)
Sodium: 139 mEq/L (ref 135–145)
Total Bilirubin: 0.7 mg/dL (ref 0.2–1.2)
Total Protein: 6.7 g/dL (ref 6.0–8.3)

## 2022-10-10 LAB — LIPID PANEL
Cholesterol: 128 mg/dL (ref 0–200)
HDL: 57.1 mg/dL (ref >39.00)
LDL Cholesterol: 59 mg/dL (ref 0–99)
NonHDL: 71.38
Total CHOL/HDL Ratio: 2
Triglycerides: 64 mg/dL (ref 0.0–149.0)
VLDL: 12.8 mg/dL (ref 0.0–40.0)

## 2022-10-10 LAB — TESTOSTERONE: Testosterone: 258.11 ng/dL — ABNORMAL LOW (ref 300.00–890.00)

## 2022-10-10 LAB — T4, FREE: Free T4: 0.94 ng/dL (ref 0.60–1.60)

## 2022-10-17 ENCOUNTER — Other Ambulatory Visit: Payer: Self-pay | Admitting: Family Medicine

## 2022-10-17 ENCOUNTER — Other Ambulatory Visit: Payer: Self-pay

## 2022-10-17 DIAGNOSIS — D72819 Decreased white blood cell count, unspecified: Secondary | ICD-10-CM

## 2022-10-17 DIAGNOSIS — E291 Testicular hypofunction: Secondary | ICD-10-CM

## 2022-10-17 MED ORDER — TESTOSTERONE 20.25 MG/ACT (1.62%) TD GEL
2.0000 | Freq: Every day | TRANSDERMAL | 5 refills | Status: DC
  Filled 2022-10-17: qty 150, 30d supply, fill #0
  Filled 2022-11-18: qty 150, 30d supply, fill #1

## 2022-10-24 ENCOUNTER — Other Ambulatory Visit: Payer: Self-pay

## 2022-10-24 MED ORDER — AMOXICILLIN 500 MG PO CAPS
500.0000 mg | ORAL_CAPSULE | Freq: Three times a day (TID) | ORAL | 0 refills | Status: DC
  Filled 2022-10-24: qty 30, 10d supply, fill #0

## 2022-11-08 ENCOUNTER — Other Ambulatory Visit: Payer: Self-pay

## 2022-11-18 ENCOUNTER — Other Ambulatory Visit: Payer: Self-pay

## 2022-11-18 MED FILL — Fluticasone Propionate Nasal Susp 50 MCG/ACT: NASAL | 30 days supply | Qty: 16 | Fill #1 | Status: AC

## 2022-12-24 ENCOUNTER — Other Ambulatory Visit: Payer: Self-pay

## 2022-12-24 ENCOUNTER — Other Ambulatory Visit: Payer: Self-pay | Admitting: Family Medicine

## 2022-12-24 MED ORDER — LOSARTAN POTASSIUM 100 MG PO TABS
100.0000 mg | ORAL_TABLET | Freq: Every day | ORAL | 5 refills | Status: DC
  Filled 2022-12-24: qty 30, 30d supply, fill #0
  Filled 2023-01-19: qty 30, 30d supply, fill #1
  Filled 2023-02-17: qty 30, 30d supply, fill #2
  Filled 2023-03-22: qty 30, 30d supply, fill #3
  Filled 2023-04-16: qty 30, 30d supply, fill #4
  Filled 2023-05-20: qty 30, 30d supply, fill #5

## 2022-12-24 NOTE — Telephone Encounter (Signed)
Requested Prescriptions   Signed Prescriptions Disp Refills   losartan (COZAAR) 100 MG tablet 30 tablet 5    Sig: Take 1 tablet (100 mg total) by mouth daily.    Authorizing Provider: Debbe Odea    Ordering User: Guerry Minors

## 2022-12-24 NOTE — Telephone Encounter (Signed)
This is OTC.   Request denied.

## 2022-12-26 MED FILL — Atorvastatin Calcium Tab 40 MG (Base Equivalent): ORAL | 90 days supply | Qty: 90 | Fill #2 | Status: AC

## 2022-12-27 ENCOUNTER — Other Ambulatory Visit: Payer: Self-pay

## 2023-01-20 ENCOUNTER — Other Ambulatory Visit: Payer: Self-pay

## 2023-03-17 ENCOUNTER — Other Ambulatory Visit (INDEPENDENT_AMBULATORY_CARE_PROVIDER_SITE_OTHER): Payer: 59

## 2023-03-17 DIAGNOSIS — E291 Testicular hypofunction: Secondary | ICD-10-CM

## 2023-03-17 DIAGNOSIS — E1169 Type 2 diabetes mellitus with other specified complication: Secondary | ICD-10-CM

## 2023-03-17 DIAGNOSIS — D72819 Decreased white blood cell count, unspecified: Secondary | ICD-10-CM | POA: Diagnosis not present

## 2023-03-17 DIAGNOSIS — Z125 Encounter for screening for malignant neoplasm of prostate: Secondary | ICD-10-CM

## 2023-03-17 DIAGNOSIS — Z0000 Encounter for general adult medical examination without abnormal findings: Secondary | ICD-10-CM

## 2023-03-17 LAB — TESTOSTERONE: Testosterone: 313.72 ng/dL (ref 300.00–890.00)

## 2023-03-17 LAB — FOLATE: Folate: 11.6 ng/mL (ref >5.9)

## 2023-03-17 NOTE — Addendum Note (Signed)
Addended by: Vincenza Hews on: 03/17/2023 08:33 AM   Modules accepted: Orders

## 2023-03-19 ENCOUNTER — Other Ambulatory Visit: Payer: 59

## 2023-03-19 DIAGNOSIS — Z0000 Encounter for general adult medical examination without abnormal findings: Secondary | ICD-10-CM | POA: Diagnosis not present

## 2023-03-19 DIAGNOSIS — E785 Hyperlipidemia, unspecified: Secondary | ICD-10-CM | POA: Diagnosis not present

## 2023-03-19 DIAGNOSIS — Z125 Encounter for screening for malignant neoplasm of prostate: Secondary | ICD-10-CM

## 2023-03-19 DIAGNOSIS — E1169 Type 2 diabetes mellitus with other specified complication: Secondary | ICD-10-CM

## 2023-03-19 NOTE — Addendum Note (Signed)
Addended by: Karen Kays C on: 03/19/2023 04:03 PM   Modules accepted: Orders

## 2023-03-19 NOTE — Addendum Note (Signed)
Addended by: Karen Kays C on: 03/19/2023 04:04 PM   Modules accepted: Orders

## 2023-03-19 NOTE — Addendum Note (Signed)
Addended by: Alvina Chou on: 03/19/2023 09:01 AM   Modules accepted: Orders

## 2023-03-20 LAB — LIPID PANEL
Cholesterol: 137 mg/dL (ref <200)
HDL: 64 mg/dL (ref >=40)
LDL Cholesterol (Calc): 60 mg/dL
Non-HDL Cholesterol (Calc): 73 mg/dL (ref <130)
Total CHOL/HDL Ratio: 2.1 (calc) (ref <5.0)
Triglycerides: 44 mg/dL (ref <150)

## 2023-03-20 LAB — COMPREHENSIVE METABOLIC PANEL
AG Ratio: 1.8 (calc) (ref 1.0–2.5)
ALT: 28 U/L (ref 9–46)
AST: 25 U/L (ref 10–35)
Albumin: 4.5 g/dL (ref 3.6–5.1)
Alkaline phosphatase (APISO): 71 U/L (ref 35–144)
BUN: 16 mg/dL (ref 7–25)
CO2: 27 mmol/L (ref 20–32)
Calcium: 9.3 mg/dL (ref 8.6–10.3)
Chloride: 103 mmol/L (ref 98–110)
Creat: 1.23 mg/dL (ref 0.70–1.30)
Globulin: 2.5 g/dL (ref 1.9–3.7)
Glucose, Bld: 117 mg/dL — ABNORMAL HIGH (ref 65–99)
Potassium: 3.9 mmol/L (ref 3.5–5.3)
Sodium: 139 mmol/L (ref 135–146)
Total Bilirubin: 0.3 mg/dL (ref 0.2–1.2)
Total Protein: 7 g/dL (ref 6.1–8.1)

## 2023-03-20 LAB — PSA: PSA: 0.22 ng/mL (ref <=4.00)

## 2023-03-22 MED FILL — Atorvastatin Calcium Tab 40 MG (Base Equivalent): ORAL | 90 days supply | Qty: 90 | Fill #3 | Status: CN

## 2023-03-24 ENCOUNTER — Other Ambulatory Visit: Payer: Self-pay

## 2023-03-24 ENCOUNTER — Ambulatory Visit: Payer: 59 | Admitting: Family Medicine

## 2023-03-24 ENCOUNTER — Encounter: Payer: Self-pay | Admitting: Family Medicine

## 2023-03-24 VITALS — BP 136/74 | HR 70 | Temp 97.8°F | Ht 71.5 in | Wt 207.4 lb

## 2023-03-24 DIAGNOSIS — Z0000 Encounter for general adult medical examination without abnormal findings: Secondary | ICD-10-CM

## 2023-03-24 DIAGNOSIS — E785 Hyperlipidemia, unspecified: Secondary | ICD-10-CM

## 2023-03-24 DIAGNOSIS — E663 Overweight: Secondary | ICD-10-CM | POA: Diagnosis not present

## 2023-03-24 DIAGNOSIS — E291 Testicular hypofunction: Secondary | ICD-10-CM | POA: Diagnosis not present

## 2023-03-24 DIAGNOSIS — R011 Cardiac murmur, unspecified: Secondary | ICD-10-CM

## 2023-03-24 DIAGNOSIS — E1169 Type 2 diabetes mellitus with other specified complication: Secondary | ICD-10-CM

## 2023-03-24 DIAGNOSIS — D72819 Decreased white blood cell count, unspecified: Secondary | ICD-10-CM | POA: Diagnosis not present

## 2023-03-24 DIAGNOSIS — I10 Essential (primary) hypertension: Secondary | ICD-10-CM | POA: Diagnosis not present

## 2023-03-24 LAB — POCT GLYCOSYLATED HEMOGLOBIN (HGB A1C): Hemoglobin A1C: 6 % — AB (ref 4.0–5.6)

## 2023-03-24 LAB — CBC WITH DIFFERENTIAL/PLATELET
Absolute Monocytes: 320 {cells}/uL (ref 200–950)
Basophils Absolute: 31 {cells}/uL (ref 0–200)
Basophils Relative: 0.9 %
Eosinophils Absolute: 51 {cells}/uL (ref 15–500)
Eosinophils Relative: 1.5 %
HCT: 40.5 % (ref 38.5–50.0)
Hemoglobin: 13.4 g/dL (ref 13.2–17.1)
Lymphs Abs: 1435 {cells}/uL (ref 850–3900)
MCH: 27.7 pg (ref 27.0–33.0)
MCHC: 33.1 g/dL (ref 32.0–36.0)
MCV: 83.9 fL (ref 80.0–100.0)
MPV: 10.4 fL (ref 7.5–12.5)
Monocytes Relative: 9.4 %
Neutro Abs: 1564 {cells}/uL (ref 1500–7800)
Neutrophils Relative %: 46 %
Platelets: 138 10*3/uL — ABNORMAL LOW (ref 140–400)
RBC: 4.83 10*6/uL (ref 4.20–5.80)
RDW: 12.6 % (ref 11.0–15.0)
Total Lymphocyte: 42.2 %
WBC: 3.4 10*3/uL — ABNORMAL LOW (ref 3.8–10.8)

## 2023-03-24 LAB — COPPER, SERUM: Copper: 84 ug/dL (ref 70–175)

## 2023-03-24 LAB — PATHOLOGIST SMEAR REVIEW

## 2023-03-24 MED ORDER — FREESTYLE LITE TEST VI STRP
ORAL_STRIP | 3 refills | Status: DC
Start: 2023-03-24 — End: 2024-01-09
  Filled 2023-03-24 (×2): qty 100, 25d supply, fill #0
  Filled 2023-06-17: qty 100, 25d supply, fill #1
  Filled 2023-08-15: qty 100, 25d supply, fill #2
  Filled 2023-09-15: qty 100, 25d supply, fill #3

## 2023-03-24 MED ORDER — TESTOSTERONE 20.25 MG/ACT (1.62%) TD GEL
2.0000 | Freq: Every day | TRANSDERMAL | 5 refills | Status: DC
  Filled 2023-03-24: qty 150, 30d supply, fill #0

## 2023-03-24 MED ORDER — ATORVASTATIN CALCIUM 40 MG PO TABS
ORAL_TABLET | Freq: Every day | ORAL | 4 refills | Status: DC
  Filled 2023-03-24: qty 90, 90d supply, fill #0
  Filled 2023-06-17: qty 90, 90d supply, fill #1
  Filled 2023-07-10 – 2023-09-15 (×2): qty 90, 90d supply, fill #2
  Filled 2023-12-10 – 2023-12-23 (×2): qty 90, 90d supply, fill #3

## 2023-03-24 MED ORDER — FREESTYLE LANCETS MISC
3 refills | Status: DC
  Filled 2023-03-24: qty 100, 25d supply, fill #0
  Filled 2023-08-15: qty 100, 25d supply, fill #1
  Filled 2023-12-10 – 2023-12-23 (×2): qty 100, 25d supply, fill #2

## 2023-03-24 NOTE — Assessment & Plan Note (Signed)
Chronic, diet controlled diabetes.  Continue healthy diet and lifestyle choices.

## 2023-03-24 NOTE — Assessment & Plan Note (Signed)
Chronic, stable on atorvastatin 40mg  daily - continue The 10-year ASCVD risk score (Arnett DK, et al., 2019) is: 20.1%   Values used to calculate the score:     Age: 57 years     Sex: Male     Is Non-Hispanic African American: Yes     Diabetic: Yes     Tobacco smoker: No     Systolic Blood Pressure: 136 mmHg     Is BP treated: Yes     HDL Cholesterol: 64 mg/dL     Total Cholesterol: 137 mg/dL

## 2023-03-24 NOTE — Assessment & Plan Note (Signed)
Preventative protocols reviewed and updated unless pt declined. Discussed healthy diet and lifestyle.  

## 2023-03-24 NOTE — Assessment & Plan Note (Signed)
Chronic, mild. Periph smear stable. Continue to monitor.

## 2023-03-24 NOTE — Assessment & Plan Note (Addendum)
Mild. Will continue to monitor.

## 2023-03-24 NOTE — Progress Notes (Signed)
Ph: [phone] Fax: [phone]   Patient ID: Gerald Collins, male    DOB: [birth year], 57 y.o.   MRN: [id number]  This visit was conducted in person.  BP 136/74   Pulse 70   Temp 97.8 F (36.6 C) (Oral)   Ht 5' 11.5" (1.816 m)   Wt 207 lb 6 oz (94.1 kg)   SpO2 98%   BMI 28.52 kg/m   BP Readings from Last 3 Encounters:  03/24/23 136/74  10/02/22 132/70  07/29/22 136/74   CC: CPE Subjective:   HPI: Gerald Collins is a 57 y.o. male presenting on 03/24/2023 for Annual Exam   Saw cardiology Dr Azucena Cecil last 07/2022 for palpitations - cardiac monitor showed occ parox SVT without sustained arrhythmias.    Hypogonadism - started testosterone replacement currently on 20.25mg /act 1 pump daily. Tolerating well and feels more energy, improved sex drive. Walks 4 mi/runs 1 mi every day except Sundays.   DM - diet controlled. Uses freestyle freedom lite glucometer. Fasting cbg 105-113, drop to 70-80s during the day.   Preventative: COLONOSCOPY 09/2015 hyperplastic polpy rpt 10 yrs Marina Goodell) Prostate cancer - no fmhx, yearly PSA.  Lung cancer screen - not eligible  Flu shot doesn't receive -  h/o bad reaction  COVID vaccine - Pfizer 08/2019, 09/2019, no booster Pneumovax 03/2020 Td 2012, Tdap 2018  Shingrix - declined  Seat belt use discussed  Sunscreen use discussed. No changing moles on skin.  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 Interim medical history since our last visit reviewed. Allergies and medications reviewed and updated. Outpatient Medications Prior to Visit  Medication Sig  Dispense Refill   amLODipine (NORVASC) 10 MG tablet Take 1 tablet (10 mg total) by mouth daily. 90 tablet 3   azelastine (ASTELIN) 0.1 % nasal spray Place 2 sprays into both nostrils 2 (two) times daily. 30 mL 3   Blood Glucose Monitoring Suppl (FREESTYLE FREEDOM LITE) w/Device KIT Use up to four times daily as directed. 1 kit 0   fluticasone (FLONASE) 50 MCG/ACT nasal spray 2 sprays in each nostril once daily 16 g 10   losartan (COZAAR) 100 MG tablet Take 1 tablet (100 mg total) by mouth daily. 30 tablet 5   vitamin C (ASCORBIC ACID) 500 MG tablet Take 500 mg by mouth daily.     atorvastatin (LIPITOR) 40 MG tablet TAKE 1 TABLET (40 MG TOTAL) BY MOUTH DAILY. 90 tablet 3   glucose blood (FREESTYLE LITE) test strip Use as directed to blood sugar once a day 100 each 3   Lancets (FREESTYLE) lancets Use up to four times daily as directed 100 each 0   Testosterone 20.25 MG/ACT (1.62%) GEL Place 2 Act onto the skin daily. 150 g 5   amoxicillin (AMOXIL) 500 MG capsule Take 1 capsule (500 mg total) by mouth 3 (three) times daily. 30 capsule 0   No facility-administered medications prior to visit.     Per HPI unless specifically indicated in ROS section below Review of Systems  Constitutional:  Negative for activity change, appetite change, chills, fatigue, fever and unexpected weight  change.  HENT:  Negative for hearing loss.   Eyes:  Negative for visual disturbance.  Respiratory:  Negative for cough, chest tightness, shortness of breath and wheezing.   Cardiovascular:  Negative for chest pain, palpitations and leg swelling.  Gastrointestinal:  Negative for abdominal distention, abdominal pain, blood in stool, constipation, diarrhea, nausea and vomiting.  Genitourinary:  Negative for difficulty urinating and hematuria.  Musculoskeletal:  Negative for arthralgias, myalgias and neck pain.  Skin:  Negative for rash.  Neurological:  Negative for dizziness, seizures, syncope and headaches.   Hematological:  Negative for adenopathy. Does not bruise/bleed easily.  Psychiatric/Behavioral:  Negative for dysphoric mood. The patient is not nervous/anxious.     Objective:  BP 136/74   Pulse 70   Temp 97.8 F (36.6 C) (Oral)   Ht 5' 11.5" (1.816 m)   Wt 207 lb 6 oz (94.1 kg)   SpO2 98%   BMI 28.52 kg/m   Wt Readings from Last 3 Encounters:  03/24/23 207 lb 6 oz (94.1 kg)  10/02/22 206 lb 8 oz (93.7 kg)  07/29/22 210 lb 3.2 oz (95.3 kg)      Physical Exam Vitals and nursing note reviewed.  Constitutional:      General: He is not in acute distress.    Appearance: Normal appearance. He is well-developed. He is not ill-appearing.  HENT:     Head: Normocephalic and atraumatic.     Right Ear: Hearing, tympanic membrane, ear canal and external ear normal.     Left Ear: Hearing, tympanic membrane, ear canal and external ear normal.     Mouth/Throat:     Mouth: Mucous membranes are moist.     Pharynx: Oropharynx is clear. No oropharyngeal exudate or posterior oropharyngeal erythema.  Eyes:     General: No scleral icterus.    Extraocular Movements: Extraocular movements intact.     Conjunctiva/sclera: Conjunctivae normal.     Pupils: Pupils are equal, round, and reactive to light.  Neck:     Thyroid: No thyroid mass or thyromegaly.  Cardiovascular:     Rate and Rhythm: Normal rate and regular rhythm.     Pulses: Normal pulses.          Radial pulses are 2+ on the right side and 2+ on the left side.     Heart sounds: Murmur (2/6 systolic USB) heard.  Pulmonary:     Effort: Pulmonary effort is normal. No respiratory distress.     Breath sounds: Normal breath sounds. No wheezing, rhonchi or rales.  Abdominal:     General: Bowel sounds are normal. There is no distension.     Palpations: Abdomen is soft. There is no mass.     Tenderness: There is no abdominal tenderness. There is no guarding or rebound.     Hernia: No hernia is present.  Musculoskeletal:        General:  Normal range of motion.     Cervical back: Normal range of motion and neck supple.     Right lower leg: No edema.     Left lower leg: No edema.  Lymphadenopathy:     Cervical: No cervical adenopathy.  Skin:    General: Skin is warm and dry.     Findings: No rash.  Neurological:     General: No focal deficit present.     Mental Status: He is alert and oriented to person, place, and time.  Psychiatric:        Mood and Affect: Mood  normal.        Behavior: Behavior normal.        Thought Content: Thought content normal.        Judgment: Judgment normal.       Results for orders placed or performed in visit on 03/24/23  POCT glycosylated hemoglobin (Hb A1C)  Result Value Ref Range   Hemoglobin A1C 6.0 (A) 4.0 - 5.6 %   HbA1c POC (<> result, Gerald entry)     HbA1c, POC (prediabetic range)     HbA1c, POC (controlled diabetic range)     Lab Results  Component Value Date   CHOL 137 03/19/2023   HDL 64 03/19/2023   LDLCALC 60 03/19/2023   LDLDIRECT 117 11/08/2010   TRIG 44 03/19/2023   CHOLHDL 2.1 03/19/2023    Lab Results  Component Value Date   NA 139 03/19/2023   CL 103 03/19/2023   K 3.9 03/19/2023   CO2 27 03/19/2023   BUN 16 03/19/2023   CREATININE 1.23 03/19/2023   GFR 68.52 10/10/2022   CALCIUM 9.3 03/19/2023   ALBUMIN 4.5 10/10/2022   GLUCOSE 117 (H) 03/19/2023    Lab Results  Component Value Date   ALT 28 03/19/2023   AST 25 03/19/2023   ALKPHOS 71 10/10/2022   BILITOT 0.3 03/19/2023    Lab Results  Component Value Date   TSH 0.97 11/06/2021    Assessment & Plan:   Problem List Items Addressed This Visit     Health maintenance examination - Primary (Chronic)    Preventative protocols reviewed and updated unless pt declined. Discussed healthy diet and lifestyle.       Type 2 diabetes mellitus with other specified complication (HCC)    Chronic, diet controlled diabetes.  Continue healthy diet and lifestyle choices.      Relevant Medications    atorvastatin (LIPITOR) 40 MG tablet   glucose blood (FREESTYLE LITE) test strip   Lancets (FREESTYLE) lancets   Other Relevant Orders   POCT glycosylated hemoglobin (Hb A1C) (Completed)   Hyperlipidemia associated with type 2 diabetes mellitus (HCC)    Chronic, stable on atorvastatin 40mg  daily - continue The 10-year ASCVD risk score (Arnett DK, et al., 2019) is: 20.1%   Values used to calculate the score:     Age: 6 years     Sex: Male     Is Non-Hispanic African American: Yes     Diabetic: Yes     Tobacco smoker: No     Systolic Blood Pressure: 136 mmHg     Is BP treated: Yes     HDL Cholesterol: 64 mg/dL     Total Cholesterol: 137 mg/dL       Relevant Medications   atorvastatin (LIPITOR) 40 MG tablet   Overweight with body mass index (BMI) 25.0-29.9   Hypogonadism in male    Continue testosterone 2 pumps daily. Levels stable. Has missed a few doses here and there.       Essential hypertension    Chronic, stable. Continue current regimen.       Relevant Medications   atorvastatin (LIPITOR) 40 MG tablet   Systolic murmur    Mild. Will continue to monitor.       Chronic leukopenia    Chronic, mild. Periph smear stable. Continue to monitor.         Meds ordered this encounter  Medications   atorvastatin (LIPITOR) 40 MG tablet    Sig: TAKE 1 TABLET (40 MG TOTAL) BY MOUTH DAILY.  Dispense:  90 tablet    Refill:  4   glucose blood (FREESTYLE LITE) test strip    Sig: Use as instructed to check blood sugar once a day    Dispense:  100 each    Refill:  3   Lancets (FREESTYLE) lancets    Sig: Use up to four times daily as directed    Dispense:  100 each    Refill:  3   Testosterone 20.25 MG/ACT (1.62%) GEL    Sig: Place 2 Act onto the skin daily.    Dispense:  150 g    Refill:  5    Orders Placed This Encounter  Procedures   POCT glycosylated hemoglobin (Hb A1C)    Patient Instructions  Schedule nurse visit at your convenience for shingles shots  (shingrix) 2 shot series.  A1c today  Schedule diabetic eye exam when you can.  You are doing well today Return as needed or in 6 months for diabetes and testosterone follow up visit   Follow up plan: Return in about 6 months (around 09/22/2023) for follow up visit.  Eustaquio Boyden, MD

## 2023-03-24 NOTE — Patient Instructions (Addendum)
Schedule nurse visit at your convenience for shingles shots (shingrix) 2 shot series.  A1c today  Schedule diabetic eye exam when you can.  You are doing well today Return as needed or in 6 months for diabetes and testosterone follow up visit

## 2023-03-24 NOTE — Assessment & Plan Note (Addendum)
Continue testosterone 2 pumps daily. Levels stable. Has missed a few doses here and there.

## 2023-03-24 NOTE — Assessment & Plan Note (Signed)
Chronic, stable. Continue current regimen. 

## 2023-06-16 LAB — HM DIABETES EYE EXAM

## 2023-06-17 ENCOUNTER — Other Ambulatory Visit: Payer: Self-pay

## 2023-06-17 MED ORDER — LOSARTAN POTASSIUM 100 MG PO TABS
100.0000 mg | ORAL_TABLET | Freq: Every day | ORAL | 0 refills | Status: DC
  Filled 2023-06-17: qty 30, 30d supply, fill #0

## 2023-06-17 NOTE — Telephone Encounter (Signed)
 Requested Prescriptions   Signed Prescriptions Disp Refills   losartan  (COZAAR ) 100 MG tablet 30 tablet 0    Sig: Take 1 tablet (100 mg total) by mouth daily.    Authorizing Provider: DARLISS ROGUE    Ordering User: CLAUDENE POWELL CROME   Last office visit: 07/29/22 with plan to f/u prn next office visit: none/no active recall

## 2023-06-20 ENCOUNTER — Other Ambulatory Visit: Payer: Self-pay

## 2023-07-10 ENCOUNTER — Encounter: Payer: Self-pay | Admitting: Pharmacy Technician

## 2023-07-10 ENCOUNTER — Other Ambulatory Visit: Payer: Self-pay | Admitting: Family Medicine

## 2023-07-10 ENCOUNTER — Other Ambulatory Visit: Payer: Self-pay

## 2023-07-11 ENCOUNTER — Other Ambulatory Visit: Payer: Self-pay

## 2023-07-11 MED FILL — Losartan Potassium Tab 100 MG: ORAL | 30 days supply | Qty: 30 | Fill #0 | Status: AC

## 2023-07-25 ENCOUNTER — Other Ambulatory Visit: Payer: Self-pay

## 2023-07-25 ENCOUNTER — Telehealth: Payer: 59 | Admitting: Physician Assistant

## 2023-07-25 DIAGNOSIS — J208 Acute bronchitis due to other specified organisms: Secondary | ICD-10-CM

## 2023-07-25 DIAGNOSIS — B9689 Other specified bacterial agents as the cause of diseases classified elsewhere: Secondary | ICD-10-CM | POA: Diagnosis not present

## 2023-07-25 MED ORDER — AZITHROMYCIN 250 MG PO TABS
ORAL_TABLET | ORAL | 0 refills | Status: AC
  Filled 2023-07-25: qty 6, 5d supply, fill #0

## 2023-07-25 MED ORDER — BENZONATATE 100 MG PO CAPS
100.0000 mg | ORAL_CAPSULE | Freq: Three times a day (TID) | ORAL | 0 refills | Status: DC | PRN
  Filled 2023-07-25: qty 30, 10d supply, fill #0

## 2023-07-25 NOTE — Progress Notes (Signed)
for a work or school excuse. You will get an email in the next two days asking about your experience. I hope that your e-visit has been valuable and will speed your recovery.

## 2023-07-25 NOTE — Progress Notes (Signed)
I have spent 5 minutes in review of e-visit questionnaire, review and updating patient chart, medical decision making and response to patient.   Piedad Climes, PA-C

## 2023-07-31 ENCOUNTER — Telehealth: Payer: Self-pay

## 2023-07-31 NOTE — Telephone Encounter (Signed)
See 07/25/23 E-Visit notes.

## 2023-07-31 NOTE — Telephone Encounter (Signed)
Copied from CRM [id number]. Topic: Clinical - Medical Advice >> Jul 31, 2023 12:03 PM Gerald Collins wrote: Reason for CRM: Patient states after the antibiotic treatment prescribed to him by Dr. Sharen Hones, his symptoms have not gotten better at all. Patient Is requesting a different medication, as he is having deep wheezing but refused to speak with a triage nurse because he can breathe fine.

## 2023-08-01 ENCOUNTER — Encounter: Payer: Self-pay | Admitting: Family Medicine

## 2023-08-01 ENCOUNTER — Ambulatory Visit: Payer: 59 | Admitting: Family Medicine

## 2023-08-01 ENCOUNTER — Other Ambulatory Visit: Payer: Self-pay

## 2023-08-01 VITALS — BP 138/80 | HR 76 | Temp 98.3°F | Ht 71.5 in | Wt 210.1 lb

## 2023-08-01 DIAGNOSIS — E1169 Type 2 diabetes mellitus with other specified complication: Secondary | ICD-10-CM

## 2023-08-01 DIAGNOSIS — R059 Cough, unspecified: Secondary | ICD-10-CM | POA: Insufficient documentation

## 2023-08-01 DIAGNOSIS — R051 Acute cough: Secondary | ICD-10-CM

## 2023-08-01 MED ORDER — PREDNISONE 20 MG PO TABS
ORAL_TABLET | ORAL | 0 refills | Status: AC
  Filled 2023-08-01: qty 9, 6d supply, fill #0

## 2023-08-01 MED ORDER — HYDROCODONE BIT-HOMATROP MBR 5-1.5 MG/5ML PO SOLN
5.0000 mL | Freq: Two times a day (BID) | ORAL | 0 refills | Status: DC | PRN
  Filled 2023-08-01: qty 120, 12d supply, fill #0

## 2023-08-01 NOTE — Assessment & Plan Note (Signed)
Ongoing for 3 wk. S/p zpack antibiotic course started last week  Anticipate post-infectious cough.  No signs of ongoing bacterial infection. Rec plain mucinex, hycodan cough syrup, prednisone taper sent to pharmacy.  Update if not improving with treatment. He agrees with plan.

## 2023-08-01 NOTE — Progress Notes (Signed)
Ph: [phone] Fax: [phone]   Patient ID: Gerald Collins, male    DOB: Oct 01, 1965, 58 y.o.   MRN: [id number]  This visit was conducted in person.  BP 138/80   Pulse 76   Temp 98.3 F (36.8 C) (Oral)   Ht 5' 11.5" (1.816 m)   Wt 210 lb 2 oz (95.3 kg)   SpO2 97%   BMI 28.90 kg/m    CC: ongoing cough Subjective:   HPI: Gerald Collins is a 58 y.o. male presenting on 08/01/2023 for Cough (Seen for e-visit on 07/25/23, tx abx. C/o ongoing cough- improved and now hears wheezing/crackling in chest. )   E visit 07/25/2023 treated with azithromcyin.   Ill for the past 3 weeks - started after running at the beach. No dyspnea but notes ongoing wheezing and rattling cough productive of clear mucous. +PN drainage. Mild sinus congestion. No significant chest congestion.   No fevers/chills, ear or tooth pain, ST, HA.   Tried tessalon perls and OTC cough syrup.   No sick contacts at home.  No asthma, non smoker.    Lab Results  Component Value Date   HGBA1C 6.0 (A) 03/24/2023       Relevant past medical, surgical, family and social history reviewed and updated as indicated. Interim medical history since our last visit reviewed. Allergies and medications reviewed and updated. Outpatient Medications Prior to Visit  Medication Sig Dispense Refill   amLODipine (NORVASC) 10 MG tablet Take 1 tablet (10 mg total) by mouth daily. 90 tablet 3   atorvastatin (LIPITOR) 40 MG tablet TAKE 1 TABLET (40 MG TOTAL) BY MOUTH DAILY. 90 tablet 4   azelastine (ASTELIN) 0.1 % nasal spray Place 2 sprays into both nostrils 2 (two) times daily. 30 mL 3   Blood Glucose Monitoring Suppl (FREESTYLE FREEDOM LITE) w/Device KIT Use up to four times daily as directed. 1 kit 0   fluticasone (FLONASE) 50 MCG/ACT nasal spray 2 sprays in each nostril once daily 16 g 10   glucose blood (FREESTYLE LITE) test strip Use as instructed to check blood sugar up to 4 times daily. 100 each 3   Lancets (FREESTYLE)  lancets Use up to four times daily as directed 100 each 3   losartan (COZAAR) 100 MG tablet Take 1 tablet (100 mg total) by mouth daily. 30 tablet 0   Testosterone 20.25 MG/ACT (1.62%) GEL Place 2 Act onto the skin daily. 150 g 5   vitamin C (ASCORBIC ACID) 500 MG tablet Take 500 mg by mouth daily.     benzonatate (TESSALON) 100 MG capsule Take 1 capsule (100 mg total) by mouth 3 (three) times daily as needed for cough. 30 capsule 0   No facility-administered medications prior to visit.     Per HPI unless specifically indicated in ROS section below Review of Systems  Objective:  BP 138/80   Pulse 76   Temp 98.3 F (36.8 C) (Oral)   Ht 5' 11.5" (1.816 m)   Wt 210 lb 2 oz (95.3 kg)   SpO2 97%   BMI 28.90 kg/m   Wt Readings from Last 3 Encounters:  08/01/23 210 lb 2 oz (95.3 kg)  03/24/23 207 lb 6 oz (94.1 kg)  10/02/22 206 lb 8 oz (93.7 kg)      Physical Exam Vitals and nursing note reviewed.  Constitutional:      Appearance: Normal appearance. He is not ill-appearing.  HENT:     Head: Normocephalic and atraumatic.  Right Ear: Hearing, tympanic membrane, ear canal and external ear normal. There is no impacted cerumen.     Left Ear: Hearing, tympanic membrane, ear canal and external ear normal. There is no impacted cerumen.     Nose:     Comments: Wearing mask    Mouth/Throat:     Mouth: Mucous membranes are moist.     Pharynx: Oropharynx is clear. No oropharyngeal exudate or posterior oropharyngeal erythema.  Eyes:     Extraocular Movements: Extraocular movements intact.     Conjunctiva/sclera: Conjunctivae normal.     Pupils: Pupils are equal, round, and reactive to light.  Cardiovascular:     Rate and Rhythm: Normal rate and regular rhythm.     Pulses: Normal pulses.     Heart sounds: Normal heart sounds. No murmur heard. Pulmonary:     Effort: Pulmonary effort is normal. No respiratory distress.     Breath sounds: Normal breath sounds. No wheezing, rhonchi or  rales.     Comments: Lungs clear Musculoskeletal:     Cervical back: Normal range of motion and neck supple. No rigidity.     Right lower leg: No edema.     Left lower leg: No edema.  Lymphadenopathy:     Cervical: No cervical adenopathy.  Skin:    General: Skin is warm and dry.     Findings: No rash.  Neurological:     Mental Status: He is alert.  Psychiatric:        Mood and Affect: Mood normal.        Behavior: Behavior normal.       Results for orders placed or performed in visit on 06/17/23  HM DIABETES EYE EXAM   Collection Time: 06/16/23  2:52 PM  Result Value Ref Range   HM Diabetic Eye Exam No Retinopathy No Retinopathy    Assessment & Plan:   Problem List Items Addressed This Visit     Type 2 diabetes mellitus with other specified complication (HCC)   Chronic, diet controlled diabetes. Reviewed steroid and hyperglycemia precautions      Cough - Primary   Ongoing for 3 wk. S/p zpack antibiotic course started last week  Anticipate post-infectious cough.  No signs of ongoing bacterial infection. Rec plain mucinex, hycodan cough syrup, prednisone taper sent to pharmacy.  Update if not improving with treatment. He agrees with plan.        Meds ordered this encounter  Medications   HYDROcodone bit-homatropine (HYCODAN) 5-1.5 MG/5ML syrup    Sig: Take 5 mLs by mouth 2 (two) times daily as needed for cough (sedation precautions).    Dispense:  120 mL    Refill:  0   predniSONE (DELTASONE) 20 MG tablet    Sig: Take two tablets daily for 3 days followed by one tablet daily for 3 days    Dispense:  9 tablet    Refill:  0    No orders of the defined types were placed in this encounter.   Patient Instructions  I think you have post-viral or post-infectious cough that should get better with time. Use fast acting guaifenesin (mucinex) with glass of water to mobilize mucous. Push fluids and rest May use hycodan cough syrup with hydrocodone to suppress cough  - sent to pharmacy May take prednisone taper also sent to pharmacy.  Let us know if fever >101, worsening productive cough or symptoms not improving with treatment.  Schedule DM follow up in April 2025  Follow up plan:  Return in about 2 months (around 09/29/2023) for follow up visit.  Eustaquio Boyden, MD

## 2023-08-01 NOTE — Telephone Encounter (Signed)
Spoke with pt asking about sxs and Dr Reece Agar prescribing abx. Pt confirms abx was prescribed by e-visit doc, not Dr Anne Ng he still has cough and now hears wheezing/crackling in chest when taking deep breath. Offered 10:30 AM OV today with Dr Reece Agar. Pt accepted and scheduled.

## 2023-08-01 NOTE — Assessment & Plan Note (Signed)
Chronic, diet controlled diabetes. Reviewed steroid and hyperglycemia precautions

## 2023-08-01 NOTE — Telephone Encounter (Addendum)
Seen by E-visit last week prescribed azithromycin.  Recommend evaluation in office if not better after E-visit treatment would offer 10:30am appt. If unable to come so soon, could see at 4pm.

## 2023-08-01 NOTE — Patient Instructions (Addendum)
I think you have post-viral or post-infectious cough that should get better with time. Use fast acting guaifenesin (mucinex) with glass of water to mobilize mucous. Push fluids and rest May use hycodan cough syrup with hydrocodone to suppress cough - sent to pharmacy May take prednisone taper also sent to pharmacy.  Let us know if fever >101, worsening productive cough or symptoms not improving with treatment.  Schedule DM follow up in April 2025

## 2023-08-12 ENCOUNTER — Ambulatory Visit: Payer: 59 | Admitting: Family Medicine

## 2023-08-15 ENCOUNTER — Other Ambulatory Visit: Payer: Self-pay | Admitting: Family Medicine

## 2023-08-15 ENCOUNTER — Other Ambulatory Visit: Payer: Self-pay

## 2023-08-15 DIAGNOSIS — I10 Essential (primary) hypertension: Secondary | ICD-10-CM

## 2023-08-15 MED ORDER — LOSARTAN POTASSIUM 100 MG PO TABS
100.0000 mg | ORAL_TABLET | Freq: Every day | ORAL | 0 refills | Status: DC
  Filled 2023-08-15: qty 30, 30d supply, fill #0

## 2023-08-15 NOTE — Telephone Encounter (Signed)
 Future refills with primary care physician. Pt is following with me on as needed basis

## 2023-09-15 ENCOUNTER — Other Ambulatory Visit: Payer: Self-pay

## 2023-09-16 ENCOUNTER — Other Ambulatory Visit: Payer: Self-pay | Admitting: *Deleted

## 2023-09-16 ENCOUNTER — Other Ambulatory Visit: Payer: Self-pay | Admitting: Family Medicine

## 2023-09-16 ENCOUNTER — Other Ambulatory Visit: Payer: Self-pay

## 2023-09-16 ENCOUNTER — Other Ambulatory Visit: Payer: Self-pay | Admitting: Emergency Medicine

## 2023-09-16 MED ORDER — AMLODIPINE BESYLATE 10 MG PO TABS
10.0000 mg | ORAL_TABLET | Freq: Every day | ORAL | 3 refills | Status: AC
  Filled 2023-09-16: qty 90, 90d supply, fill #0
  Filled 2023-12-10 – 2023-12-23 (×2): qty 90, 90d supply, fill #1
  Filled 2024-03-23: qty 90, 90d supply, fill #2
  Filled 2024-06-18: qty 90, 90d supply, fill #3

## 2023-09-16 MED FILL — Losartan Potassium Tab 100 MG: ORAL | 90 days supply | Qty: 90 | Fill #0 | Status: AC

## 2023-09-16 NOTE — Telephone Encounter (Signed)
error 

## 2023-09-17 ENCOUNTER — Other Ambulatory Visit: Payer: Self-pay

## 2023-09-29 ENCOUNTER — Ambulatory Visit: Payer: 59 | Admitting: Family Medicine

## 2023-09-29 ENCOUNTER — Encounter: Payer: Self-pay | Admitting: Family Medicine

## 2023-09-29 ENCOUNTER — Other Ambulatory Visit: Payer: Self-pay

## 2023-09-29 ENCOUNTER — Telehealth: Payer: Self-pay

## 2023-09-29 ENCOUNTER — Other Ambulatory Visit (HOSPITAL_COMMUNITY): Payer: Self-pay

## 2023-09-29 VITALS — BP 124/72 | HR 68 | Temp 98.2°F | Ht 71.5 in | Wt 217.2 lb

## 2023-09-29 DIAGNOSIS — E291 Testicular hypofunction: Secondary | ICD-10-CM | POA: Diagnosis not present

## 2023-09-29 DIAGNOSIS — I10 Essential (primary) hypertension: Secondary | ICD-10-CM

## 2023-09-29 DIAGNOSIS — E1169 Type 2 diabetes mellitus with other specified complication: Secondary | ICD-10-CM | POA: Diagnosis not present

## 2023-09-29 LAB — CBC WITH DIFFERENTIAL/PLATELET
Basophils Absolute: 0 10*3/uL (ref 0.0–0.1)
Basophils Relative: 0.6 % (ref 0.0–3.0)
Eosinophils Absolute: 0 10*3/uL (ref 0.0–0.7)
Eosinophils Relative: 1.3 % (ref 0.0–5.0)
HCT: 41.4 % (ref 39.0–52.0)
Hemoglobin: 13.8 g/dL (ref 13.0–17.0)
Lymphocytes Relative: 53.9 % — ABNORMAL HIGH (ref 12.0–46.0)
Lymphs Abs: 1.7 10*3/uL (ref 0.7–4.0)
MCHC: 33.3 g/dL (ref 30.0–36.0)
MCV: 84.5 fl (ref 78.0–100.0)
Monocytes Absolute: 0.3 10*3/uL (ref 0.1–1.0)
Monocytes Relative: 9.7 % (ref 3.0–12.0)
Neutro Abs: 1.1 10*3/uL — ABNORMAL LOW (ref 1.4–7.7)
Neutrophils Relative %: 34.5 % — ABNORMAL LOW (ref 43.0–77.0)
Platelets: 142 10*3/uL — ABNORMAL LOW (ref 150.0–400.0)
RBC: 4.9 Mil/uL (ref 4.22–5.81)
RDW: 13.4 % (ref 11.5–15.5)
WBC: 3.2 10*3/uL — ABNORMAL LOW (ref 4.0–10.5)

## 2023-09-29 LAB — COMPREHENSIVE METABOLIC PANEL WITH GFR
ALT: 29 U/L (ref 0–53)
AST: 25 U/L (ref 0–37)
Albumin: 4.9 g/dL (ref 3.5–5.2)
Alkaline Phosphatase: 59 U/L (ref 39–117)
BUN: 14 mg/dL (ref 6–23)
CO2: 30 meq/L (ref 19–32)
Calcium: 9.4 mg/dL (ref 8.4–10.5)
Chloride: 102 meq/L (ref 96–112)
Creatinine, Ser: 1.2 mg/dL (ref 0.40–1.50)
GFR: 66.69 mL/min (ref >60.00)
Glucose, Bld: 133 mg/dL — ABNORMAL HIGH (ref 70–99)
Potassium: 4.2 meq/L (ref 3.5–5.1)
Sodium: 139 meq/L (ref 135–145)
Total Bilirubin: 0.7 mg/dL (ref 0.2–1.2)
Total Protein: 6.9 g/dL (ref 6.0–8.3)

## 2023-09-29 LAB — POCT GLYCOSYLATED HEMOGLOBIN (HGB A1C): Hemoglobin A1C: 6.7 % — AB (ref 4.0–5.6)

## 2023-09-29 LAB — TESTOSTERONE: Testosterone: 245.34 ng/dL — ABNORMAL LOW (ref 300.00–890.00)

## 2023-09-29 MED ORDER — TESTOSTERONE 20.25 MG/ACT (1.62%) TD GEL
2.0000 | Freq: Every day | TRANSDERMAL | 5 refills | Status: DC
  Filled 2023-09-29: qty 150, 30d supply, fill #0

## 2023-09-29 NOTE — Assessment & Plan Note (Signed)
 Chronic, stable. Continue current regimen.

## 2023-09-29 NOTE — Progress Notes (Signed)
 Ph: [phone] Fax: [phone]   Patient ID: Gerald Collins, male    DOB: Dec 19, 1965, 58 y.o.   MRN: [id number]  This visit was conducted in person.  BP 124/72   Pulse 68   Temp 98.2 F (36.8 C) (Oral)   Ht 5' 11.5" (1.816 m)   Wt 217 lb 4 oz (98.5 kg)   SpO2 94%   BMI 29.88 kg/m    CC: DM f/u visit  Subjective:   HPI: Gerald Collins is a 58 y.o. male presenting on 09/29/2023 for Medical Management of Chronic Issues (Here for 2 mo DM f/u.)   Hypogonadism - continues testosterone  2 pumps daily.   HTN - Compliant with current antihypertensive regimen of amlodipine  and losartan .  No low blood pressure readings or symptoms of dizziness/syncope.    DM - does regularly check sugars fasting 116-130s. PM readings 80s. Compliant with antihyperglycemic regimen which includes: diet controlled. Denies low sugars or hypoglycemic symptoms. Denies paresthesias, blurry vision. Last diabetic eye exam 06/2023. Glucometer brand: freestyle freedom lite. Last foot exam: 09/2022. DSME: completed.  Lab Results  Component Value Date   HGBA1C 6.7 (A) 09/29/2023   Diabetic Foot Exam - Simple   Simple Foot Form Diabetic Foot exam was performed with the following findings: Yes 09/29/2023  8:22 AM  Visual Inspection No deformities, no ulcerations, no other skin breakdown bilaterally: Yes Sensation Testing Intact to touch and monofilament testing bilaterally: Yes Pulse Check Posterior Tibialis and Dorsalis pulse intact bilaterally: Yes Comments No claudication    Lab Results  Component Value Date   MICROALBUR <0.7 03/18/2022        Relevant past medical, surgical, family and social history reviewed and updated as indicated. Interim medical history since our last visit reviewed. Allergies and medications reviewed and updated. Outpatient Medications Prior to Visit  Medication Sig Dispense Refill   amLODipine  (NORVASC ) 10 MG tablet Take 1 tablet (10 mg total) by mouth daily. 90 tablet 3    atorvastatin  (LIPITOR) 40 MG tablet TAKE 1 TABLET (40 MG TOTAL) BY MOUTH DAILY. 90 tablet 4   azelastine  (ASTELIN ) 0.1 % nasal spray Place 2 sprays into both nostrils 2 (two) times daily. 30 mL 3   Blood Glucose Monitoring Suppl (FREESTYLE FREEDOM LITE) w/Device KIT Use up to four times daily as directed. 1 kit 0   fluticasone  (FLONASE ) 50 MCG/ACT nasal spray 2 sprays in each nostril once daily 16 g 10   glucose blood (FREESTYLE LITE) test strip Use as instructed to check blood sugar up to 4 times daily. 100 each 3   Lancets (FREESTYLE) lancets Use up to four times daily as directed 100 each 3   losartan  (COZAAR ) 100 MG tablet Take 1 tablet (100 mg total) by mouth daily. 90 tablet 0   vitamin C (ASCORBIC ACID) 500 MG tablet Take 500 mg by mouth daily.     Testosterone  20.25 MG/ACT (1.62%) GEL Place 2 Act onto the skin daily. 150 g 5   HYDROcodone  bit-homatropine (HYCODAN) 5-1.5 MG/5ML syrup Take 5 mLs by mouth 2 (two) times daily as needed for cough (sedation precautions). 120 mL 0   No facility-administered medications prior to visit.     Per HPI unless specifically indicated in ROS section below Review of Systems  Objective:  BP 124/72   Pulse 68   Temp 98.2 F (36.8 C) (Oral)   Ht 5' 11.5" (1.816 m)   Wt 217 lb 4 oz (98.5 kg)   SpO2 94%  BMI 29.88 kg/m   Wt Readings from Last 3 Encounters:  09/29/23 217 lb 4 oz (98.5 kg)  08/01/23 210 lb 2 oz (95.3 kg)  03/24/23 207 lb 6 oz (94.1 kg)      Physical Exam Vitals and nursing note reviewed.  Constitutional:      Appearance: Normal appearance. He is not ill-appearing.  HENT:     Head: Normocephalic and atraumatic.     Mouth/Throat:     Mouth: Mucous membranes are moist.     Pharynx: Oropharynx is clear. No oropharyngeal exudate or posterior oropharyngeal erythema.  Eyes:     Extraocular Movements: Extraocular movements intact.     Conjunctiva/sclera: Conjunctivae normal.     Pupils: Pupils are equal, round, and reactive  to light.  Cardiovascular:     Rate and Rhythm: Normal rate and regular rhythm.     Pulses: Normal pulses.     Heart sounds: Normal heart sounds. No murmur heard. Pulmonary:     Effort: Pulmonary effort is normal. No respiratory distress.     Breath sounds: Normal breath sounds. No wheezing, rhonchi or rales.  Musculoskeletal:     Right lower leg: No edema.     Left lower leg: No edema.     Comments: See HPI for foot exam if done  Skin:    General: Skin is warm and dry.     Findings: No rash.  Neurological:     Mental Status: He is alert.  Psychiatric:        Mood and Affect: Mood normal.        Behavior: Behavior normal.       Results for orders placed or performed in visit on 09/29/23  POCT glycosylated hemoglobin (Hb A1C)   Collection Time: 09/29/23  8:16 AM  Result Value Ref Range   Hemoglobin A1C 6.7 (A) 4.0 - 5.6 %   HbA1c POC (<> result, manual entry)     HbA1c, POC (prediabetic range)     HbA1c, POC (controlled diabetic range)     Lab Results  Component Value Date   NA 139 03/19/2023   CL 103 03/19/2023   K 3.9 03/19/2023   CO2 27 03/19/2023   BUN 16 03/19/2023   CREATININE 1.23 03/19/2023   GFR 68.52 10/10/2022   CALCIUM  9.3 03/19/2023   ALBUMIN 4.5 10/10/2022   GLUCOSE 117 (H) 03/19/2023    Lab Results  Component Value Date   WBC 3.4 (L) 03/17/2023   HGB 13.4 03/17/2023   HCT 40.5 03/17/2023   MCV 83.9 03/17/2023   PLT 138 (L) 03/17/2023    Lab Results  Component Value Date   TESTOSTERONE  313.72 03/17/2023   Assessment & Plan:   Problem List Items Addressed This Visit     Type 2 diabetes mellitus with other specified complication (HCC) - Primary   Chronic, stable, diet controlled. Weight gain noted.  Update A1c - some deterioration noted. Encouraged continue following diabetic diet.  Reassess at f/u visit.       Relevant Orders   POCT glycosylated hemoglobin (Hb A1C) (Completed)   Hypogonadism in male   Chronic, overall stable on  testosterone  20.25mg  /act 1.62% gel 2 pumps daily.  Update labs, refilled testosterone .       Relevant Orders   Testosterone    CBC with Differential/Platelet   Comprehensive metabolic panel with GFR   Essential hypertension   Chronic, stable. Continue current regimen.         Meds ordered this encounter  Medications   Testosterone  20.25 MG/ACT (1.62%) GEL    Sig: Place 2 Act onto the skin daily.    Dispense:  150 g    Refill:  5    Orders Placed This Encounter  Procedures   Testosterone    CBC with Differential/Platelet   Comprehensive metabolic panel with GFR   POCT glycosylated hemoglobin (Hb A1C)    Patient Instructions  Testosterone  refilled Labs today  Return in 3 months for physical.   Follow up plan: Return in about 3 months (around 12/29/2023) for annual exam, prior fasting for blood work.  Claire Crick, MD

## 2023-09-29 NOTE — Telephone Encounter (Signed)
 Pharmacy Patient Advocate Encounter   Received notification from CoverMyMeds that prior authorization for TESTOSTERONE  1.62% GEL is required/requested.   Insurance verification completed.   The patient is insured through [facility] .   Per test claim: PA required; PA submitted to above mentioned insurance via CoverMyMeds Key/confirmation #/EOC [facility] Status is pending

## 2023-09-29 NOTE — Telephone Encounter (Signed)
 Pharmacy Patient Advocate Encounter  Received notification from [employer] that Prior Authorization for TESTOSTERONE  1.62% GEL  has been APPROVED from 09/29/2023 to 09/28/2024. Ran test claim, Copay is $42.36. This test claim was processed through [facility]- copay amounts may vary at other pharmacies due to pharmacy/plan contracts, or as the patient moves through the different stages of their insurance plan.   PA #/Case ID/Reference #: [id number]

## 2023-09-29 NOTE — Assessment & Plan Note (Signed)
 Chronic, overall stable on testosterone  20.25mg  /act 1.62% gel 2 pumps daily.  Update labs, refilled testosterone .

## 2023-09-29 NOTE — Assessment & Plan Note (Addendum)
 Chronic, stable, diet controlled. Weight gain noted.  Update A1c - some deterioration noted. Encouraged continue following diabetic diet.  Reassess at f/u visit.

## 2023-09-29 NOTE — Patient Instructions (Addendum)
 Testosterone  refilled Labs today  Return in 3 months for physical.

## 2023-09-30 ENCOUNTER — Other Ambulatory Visit: Payer: Self-pay | Admitting: Family Medicine

## 2023-09-30 ENCOUNTER — Other Ambulatory Visit: Payer: Self-pay

## 2023-09-30 ENCOUNTER — Encounter: Payer: Self-pay | Admitting: Family Medicine

## 2023-09-30 MED ORDER — TESTOSTERONE 20.25 MG/ACT (1.62%) TD GEL
3.0000 | Freq: Every day | TRANSDERMAL | 5 refills | Status: DC
  Filled 2023-09-30: qty 150, 20d supply, fill #0
  Filled 2023-10-01: qty 150, 30d supply, fill #0

## 2023-10-02 ENCOUNTER — Other Ambulatory Visit: Payer: Self-pay

## 2023-10-06 ENCOUNTER — Other Ambulatory Visit: Payer: Self-pay

## 2023-10-27 ENCOUNTER — Other Ambulatory Visit: Payer: Self-pay

## 2023-12-10 ENCOUNTER — Other Ambulatory Visit: Payer: Self-pay | Admitting: Family Medicine

## 2023-12-10 ENCOUNTER — Encounter: Payer: Self-pay | Admitting: Family Medicine

## 2023-12-11 ENCOUNTER — Other Ambulatory Visit: Payer: Self-pay

## 2023-12-11 ENCOUNTER — Other Ambulatory Visit: Payer: Self-pay | Admitting: Family Medicine

## 2023-12-11 DIAGNOSIS — I10 Essential (primary) hypertension: Secondary | ICD-10-CM

## 2023-12-11 MED FILL — Losartan Potassium Tab 100 MG: ORAL | 90 days supply | Qty: 90 | Fill #0 | Status: CN

## 2023-12-11 NOTE — Telephone Encounter (Signed)
 Spoke with pt scheduling CPE on 01/09/24 at 9:30 and fasting labs on 12/31/23 at 7:45. Pt expresses his thanks.

## 2023-12-15 ENCOUNTER — Other Ambulatory Visit: Payer: Self-pay

## 2023-12-23 ENCOUNTER — Other Ambulatory Visit: Payer: Self-pay

## 2023-12-23 MED FILL — Losartan Potassium Tab 100 MG: ORAL | 90 days supply | Qty: 90 | Fill #0 | Status: AC

## 2023-12-28 ENCOUNTER — Other Ambulatory Visit: Payer: Self-pay | Admitting: Family Medicine

## 2023-12-28 DIAGNOSIS — E1169 Type 2 diabetes mellitus with other specified complication: Secondary | ICD-10-CM

## 2023-12-28 DIAGNOSIS — Z125 Encounter for screening for malignant neoplasm of prostate: Secondary | ICD-10-CM

## 2023-12-28 DIAGNOSIS — E291 Testicular hypofunction: Secondary | ICD-10-CM

## 2023-12-28 DIAGNOSIS — D72819 Decreased white blood cell count, unspecified: Secondary | ICD-10-CM

## 2023-12-31 ENCOUNTER — Other Ambulatory Visit (INDEPENDENT_AMBULATORY_CARE_PROVIDER_SITE_OTHER)

## 2023-12-31 DIAGNOSIS — Z125 Encounter for screening for malignant neoplasm of prostate: Secondary | ICD-10-CM

## 2023-12-31 DIAGNOSIS — E1169 Type 2 diabetes mellitus with other specified complication: Secondary | ICD-10-CM | POA: Diagnosis not present

## 2023-12-31 DIAGNOSIS — E291 Testicular hypofunction: Secondary | ICD-10-CM

## 2023-12-31 DIAGNOSIS — D72819 Decreased white blood cell count, unspecified: Secondary | ICD-10-CM | POA: Diagnosis not present

## 2023-12-31 LAB — COMPREHENSIVE METABOLIC PANEL WITH GFR
ALT: 27 U/L (ref 0–53)
AST: 26 U/L (ref 0–37)
Albumin: 4.7 g/dL (ref 3.5–5.2)
Alkaline Phosphatase: 62 U/L (ref 39–117)
BUN: 13 mg/dL (ref 6–23)
CO2: 29 meq/L (ref 19–32)
Calcium: 9.1 mg/dL (ref 8.4–10.5)
Chloride: 102 meq/L (ref 96–112)
Creatinine, Ser: 1.17 mg/dL (ref 0.40–1.50)
GFR: 68.63 mL/min (ref >60.00)
Glucose, Bld: 113 mg/dL — ABNORMAL HIGH (ref 70–99)
Potassium: 3.9 meq/L (ref 3.5–5.1)
Sodium: 137 meq/L (ref 135–145)
Total Bilirubin: 0.6 mg/dL (ref 0.2–1.2)
Total Protein: 6.7 g/dL (ref 6.0–8.3)

## 2023-12-31 LAB — CBC WITH DIFFERENTIAL/PLATELET
Basophils Absolute: 0 K/uL (ref 0.0–0.1)
Basophils Relative: 0.7 % (ref 0.0–3.0)
Eosinophils Absolute: 0 K/uL (ref 0.0–0.7)
Eosinophils Relative: 1 % (ref 0.0–5.0)
HCT: 38.9 % — ABNORMAL LOW (ref 39.0–52.0)
Hemoglobin: 12.7 g/dL — ABNORMAL LOW (ref 13.0–17.0)
Lymphocytes Relative: 46.3 % — ABNORMAL HIGH (ref 12.0–46.0)
Lymphs Abs: 1.4 K/uL (ref 0.7–4.0)
MCHC: 32.7 g/dL (ref 30.0–36.0)
MCV: 83.5 fl (ref 78.0–100.0)
Monocytes Absolute: 0.3 K/uL (ref 0.1–1.0)
Monocytes Relative: 9.8 % (ref 3.0–12.0)
Neutro Abs: 1.3 K/uL — ABNORMAL LOW (ref 1.4–7.7)
Neutrophils Relative %: 42.2 % — ABNORMAL LOW (ref 43.0–77.0)
Platelets: 143 K/uL — ABNORMAL LOW (ref 150.0–400.0)
RBC: 4.66 Mil/uL (ref 4.22–5.81)
RDW: 13.2 % (ref 11.5–15.5)
WBC: 3 K/uL — ABNORMAL LOW (ref 4.0–10.5)

## 2023-12-31 LAB — LIPID PANEL
Cholesterol: 135 mg/dL (ref 0–200)
HDL: 63.4 mg/dL (ref >39.00)
LDL Cholesterol: 60 mg/dL (ref 0–99)
NonHDL: 71.14
Total CHOL/HDL Ratio: 2
Triglycerides: 54 mg/dL (ref 0.0–149.0)
VLDL: 10.8 mg/dL (ref 0.0–40.0)

## 2023-12-31 LAB — HEMOGLOBIN A1C: Hgb A1c MFr Bld: 6.7 % — ABNORMAL HIGH (ref 4.6–6.5)

## 2023-12-31 LAB — TESTOSTERONE: Testosterone: 382.89 ng/dL (ref 300.00–890.00)

## 2023-12-31 LAB — PSA: PSA: 0.25 ng/mL (ref 0.10–4.00)

## 2023-12-31 LAB — MICROALBUMIN / CREATININE URINE RATIO
Creatinine,U: 34.2 mg/dL
Microalb Creat Ratio: UNDETERMINED mg/g (ref 0.0–30.0)
Microalb, Ur: <0.7 mg/dL

## 2023-12-31 LAB — VITAMIN B12: Vitamin B-12: 409 pg/mL (ref 211–911)

## 2024-01-01 ENCOUNTER — Ambulatory Visit: Payer: Self-pay | Admitting: Family Medicine

## 2024-01-08 ENCOUNTER — Encounter: Payer: Self-pay | Admitting: Family Medicine

## 2024-01-09 ENCOUNTER — Ambulatory Visit (INDEPENDENT_AMBULATORY_CARE_PROVIDER_SITE_OTHER)
Admission: RE | Admit: 2024-01-09 | Discharge: 2024-01-09 | Disposition: A | Discharge status: Home / Self Care | Source: Ambulatory Visit | Attending: Family Medicine | Admitting: Family Medicine

## 2024-01-09 ENCOUNTER — Other Ambulatory Visit: Payer: Self-pay

## 2024-01-09 ENCOUNTER — Ambulatory Visit: Admitting: Family Medicine

## 2024-01-09 ENCOUNTER — Encounter: Payer: Self-pay | Admitting: Family Medicine

## 2024-01-09 VITALS — BP 128/80 | HR 63 | Temp 98.8°F | Ht 71.5 in | Wt 211.4 lb

## 2024-01-09 DIAGNOSIS — D649 Anemia, unspecified: Secondary | ICD-10-CM | POA: Diagnosis not present

## 2024-01-09 DIAGNOSIS — M7731 Calcaneal spur, right foot: Secondary | ICD-10-CM | POA: Diagnosis not present

## 2024-01-09 DIAGNOSIS — E291 Testicular hypofunction: Secondary | ICD-10-CM

## 2024-01-09 DIAGNOSIS — I10 Essential (primary) hypertension: Secondary | ICD-10-CM

## 2024-01-09 DIAGNOSIS — D72819 Decreased white blood cell count, unspecified: Secondary | ICD-10-CM | POA: Diagnosis not present

## 2024-01-09 DIAGNOSIS — M7661 Achilles tendinitis, right leg: Secondary | ICD-10-CM | POA: Diagnosis not present

## 2024-01-09 DIAGNOSIS — Z0000 Encounter for general adult medical examination without abnormal findings: Secondary | ICD-10-CM

## 2024-01-09 DIAGNOSIS — S99921A Unspecified injury of right foot, initial encounter: Secondary | ICD-10-CM | POA: Diagnosis not present

## 2024-01-09 DIAGNOSIS — H18519 Endothelial corneal dystrophy, unspecified eye: Secondary | ICD-10-CM | POA: Diagnosis not present

## 2024-01-09 DIAGNOSIS — S92514A Nondisplaced fracture of proximal phalanx of right lesser toe(s), initial encounter for closed fracture: Secondary | ICD-10-CM | POA: Diagnosis not present

## 2024-01-09 DIAGNOSIS — E1169 Type 2 diabetes mellitus with other specified complication: Secondary | ICD-10-CM | POA: Diagnosis not present

## 2024-01-09 DIAGNOSIS — E663 Overweight: Secondary | ICD-10-CM

## 2024-01-09 DIAGNOSIS — Z0001 Encounter for general adult medical examination with abnormal findings: Secondary | ICD-10-CM | POA: Diagnosis not present

## 2024-01-09 LAB — URINALYSIS, ROUTINE W REFLEX MICROSCOPIC
Bilirubin Urine: NEGATIVE
Hgb urine dipstick: NEGATIVE
Ketones, ur: NEGATIVE
Leukocytes,Ua: NEGATIVE
Nitrite: NEGATIVE
RBC / HPF: NONE SEEN (ref 0–?)
Specific Gravity, Urine: 1.025 (ref 1.000–1.030)
Total Protein, Urine: NEGATIVE
Urine Glucose: NEGATIVE
Urobilinogen, UA: 1 (ref 0.0–1.0)
WBC, UA: NONE SEEN (ref 0–?)
pH: 6 (ref 5.0–8.0)

## 2024-01-09 MED ORDER — FREESTYLE LANCETS MISC
3 refills | Status: AC
  Filled 2024-01-09 – 2024-06-02 (×2): qty 100, 25d supply, fill #0

## 2024-01-09 MED ORDER — FLUTICASONE PROPIONATE 50 MCG/ACT NA SUSP
2.0000 | Freq: Every day | NASAL | 5 refills | Status: AC
  Filled 2024-01-09 – 2024-03-23 (×2): qty 16, 30d supply, fill #0
  Filled 2024-06-02: qty 16, 30d supply, fill #1

## 2024-01-09 MED ORDER — AZELASTINE HCL 0.1 % NA SOLN
2.0000 | Freq: Two times a day (BID) | NASAL | 3 refills | Status: AC
  Filled 2024-01-09: qty 30, 50d supply, fill #0
  Filled 2024-03-23: qty 30, 30d supply, fill #0
  Filled 2024-06-02: qty 30, 30d supply, fill #1

## 2024-01-09 MED ORDER — ATORVASTATIN CALCIUM 40 MG PO TABS
40.0000 mg | ORAL_TABLET | Freq: Every day | ORAL | 3 refills | Status: AC
  Filled 2024-01-09: qty 90, fill #0
  Filled 2024-03-23: qty 90, 90d supply, fill #0
  Filled 2024-06-18: qty 90, 90d supply, fill #1

## 2024-01-09 MED ORDER — FREESTYLE LITE TEST VI STRP
ORAL_STRIP | 3 refills | Status: AC
  Filled 2024-01-09 – 2024-03-23 (×2): qty 100, 25d supply, fill #0
  Filled 2024-06-02: qty 100, 25d supply, fill #1

## 2024-01-09 MED ORDER — LOSARTAN POTASSIUM 100 MG PO TABS
100.0000 mg | ORAL_TABLET | Freq: Every day | ORAL | 3 refills | Status: AC
  Filled 2024-01-09 – 2024-03-23 (×2): qty 90, 90d supply, fill #0
  Filled 2024-06-18: qty 90, 90d supply, fill #1

## 2024-01-09 MED ORDER — TESTOSTERONE 20.25 MG/ACT (1.62%) TD GEL
3.0000 | Freq: Every day | TRANSDERMAL | 5 refills | Status: AC
  Filled 2024-01-09 – 2024-03-23 (×2): qty 150, 30d supply, fill #0
  Filled 2024-06-02: qty 150, 30d supply, fill #1

## 2024-01-09 NOTE — Assessment & Plan Note (Signed)
 Chronic, stable. Continue current regimen.

## 2024-01-09 NOTE — Progress Notes (Signed)
 Ph: [phone] Fax: [phone]   Patient ID: Gerald Collins Finder, male    DOB: 20-Jan-1966, 58 y.o.   MRN: [id number]  This visit was conducted in person.  BP 128/80   Pulse 63   Temp 98.8 F (37.1 C) (Oral)   Ht 5' 11.5 (1.816 m)   Wt 211 lb 6 oz (95.9 kg)   SpO2 100%   BMI 29.07 kg/m    CC: CPE Subjective:   HPI: Gerald Collins is a 58 y.o. male presenting on 01/09/2024 for Annual Exam   States can get 1 physical per calendar year.   Saw cardiology Dr Darliss last 07/2022 for palpitations - cardiac monitor showed occ parox SVT without sustained arrhythmias.    Hypogonadism - on testosterone  replacement 20.25mg /act 3 pumps daily. Tolerating well and feels more energy, improved sex drive. Walks 4 mi/runs 1 mi every day except Sundays, lifting weights. Sometimes misses doses.   Last week 12/31/2023 stubbed toe walking in dark on coffee table.   DM - diet controlled. Uses freestyle freedom lite glucometer. Fasting sugars 108-120.   Preventative: COLONOSCOPY 09/2015 hyperplastic polpy rpt 10 yrs [place]ta) Prostate cancer - no fmhx, yearly PSA.  Lung cancer screen - not eligible  Flu shot doesn't receive -  h/o bad reaction  COVID vaccine - Pfizer 08/2019, 09/2019, no booster Pneumovax 03/2020 Td 2012, Tdap 2018  Shingrix - discussed, declined for now Seat belt use discussed  Sunscreen use discussed. No changing moles on skin.  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 Interim medical history since our last visit reviewed. Allergies and medications reviewed and updated. Outpatient  Medications Prior to Visit  Medication Sig Dispense Refill   amLODipine  (NORVASC ) 10 MG tablet Take 1 tablet (10 mg total) by mouth daily. 90 tablet 3   Blood Glucose Monitoring Suppl (FREESTYLE FREEDOM LITE) w/Device KIT Use up to four times daily as directed. 1 kit 0   vitamin C (ASCORBIC ACID) 500 MG tablet Take 500 mg by mouth daily.     atorvastatin  (LIPITOR) 40 MG tablet TAKE 1 TABLET (40 MG TOTAL) BY MOUTH DAILY. 90 tablet 4   azelastine  (ASTELIN ) 0.1 % nasal spray Place 2 sprays into both nostrils 2 (two) times daily. 30 mL 3   fluticasone  (FLONASE ) 50 MCG/ACT nasal spray 2 sprays in each nostril once daily 16 g 10   glucose blood (FREESTYLE LITE) test strip Use as instructed to check blood sugar up to 4 times daily. 100 each 3   Lancets (FREESTYLE) lancets Use up to four times daily as directed 100 each 3   losartan  (COZAAR ) 100 MG tablet Take 1 tablet (100 mg total) by mouth daily. 90 tablet 0   Testosterone  20.25 MG/ACT (1.62%) GEL Place 3 Pump onto the skin daily. 150 g 5   No facility-administered medications prior to visit.     Per HPI unless specifically indicated in ROS section below Review of Systems  Constitutional:  Negative for activity change, appetite change, chills, fatigue, fever and unexpected weight change.  HENT:  Negative for hearing loss.   Eyes:  Negative  for visual disturbance.  Respiratory:  Negative for cough, chest tightness, shortness of breath and wheezing.   Cardiovascular:  Negative for chest pain, palpitations and leg swelling.  Gastrointestinal:  Negative for abdominal distention, abdominal pain, blood in stool, constipation, diarrhea, nausea and vomiting.  Genitourinary:  Negative for difficulty urinating and hematuria.  Musculoskeletal:  Negative for arthralgias, myalgias and neck pain.  Skin:  Negative for rash.  Neurological:  Negative for dizziness, seizures, syncope and headaches.  Hematological:  Negative for adenopathy. Does not bruise/bleed  easily.  Psychiatric/Behavioral:  Negative for dysphoric mood. The patient is not nervous/anxious.     Objective:  BP 128/80   Pulse 63   Temp 98.8 F (37.1 C) (Oral)   Ht 5' 11.5 (1.816 m)   Wt 211 lb 6 oz (95.9 kg)   SpO2 100%   BMI 29.07 kg/m   Wt Readings from Last 3 Encounters:  01/09/24 211 lb 6 oz (95.9 kg)  09/29/23 217 lb 4 oz (98.5 kg)  08/01/23 210 lb 2 oz (95.3 kg)      Physical Exam Vitals and nursing note reviewed.  Constitutional:      General: He is not in acute distress.    Appearance: Normal appearance. He is well-developed. He is not ill-appearing.  HENT:     Head: Normocephalic and atraumatic.     Right Ear: Hearing, tympanic membrane, ear canal and external ear normal.     Left Ear: Hearing, tympanic membrane, ear canal and external ear normal.     Mouth/Throat:     Mouth: Mucous membranes are moist.     Pharynx: Oropharynx is clear. No oropharyngeal exudate or posterior oropharyngeal erythema.  Eyes:     General: No scleral icterus.    Extraocular Movements: Extraocular movements intact.     Conjunctiva/sclera: Conjunctivae normal.     Pupils: Pupils are equal, round, and reactive to light.  Neck:     Thyroid : No thyroid  mass or thyromegaly.  Cardiovascular:     Rate and Rhythm: Normal rate and regular rhythm.     Pulses: Normal pulses.          Radial pulses are 2+ on the right side and 2+ on the left side.     Heart sounds: Normal heart sounds. No murmur heard. Pulmonary:     Effort: Pulmonary effort is normal. No respiratory distress.     Breath sounds: Normal breath sounds. No wheezing, rhonchi or rales.  Abdominal:     General: Bowel sounds are normal. There is no distension.     Palpations: Abdomen is soft. There is no mass.     Tenderness: There is no abdominal tenderness. There is no guarding or rebound.     Hernia: No hernia is present.  Musculoskeletal:        General: Tenderness present. Normal range of motion.     Cervical back:  Normal range of motion and neck supple.     Right lower leg: No edema.     Left lower leg: No edema.     Comments:  2+ DP bilaterally No pain at base of 5th MT Point tender to palpation at 5th MTPJ on right and distal metatarsal shaft  Mild discomfort to palpation of 5th toe but no significant pain with flexion/extension at joint Painful with axial loading of R 5th toe  Sensation intact  Lymphadenopathy:     Cervical: No cervical adenopathy.  Skin:    General: Skin is warm and dry.  Findings: No rash.  Neurological:     General: No focal deficit present.     Mental Status: He is alert and oriented to person, place, and time.  Psychiatric:        Mood and Affect: Mood normal.        Behavior: Behavior normal.        Thought Content: Thought content normal.        Judgment: Judgment normal.    Diabetic Foot Exam - Simple   Simple Foot Form Diabetic Foot exam was performed with the following findings: Yes 01/09/2024  9:51 AM  Visual Inspection No deformities, no ulcerations, no other skin breakdown bilaterally: Yes Sensation Testing Intact to touch and monofilament testing bilaterally: Yes Pulse Check Posterior Tibialis and Dorsalis pulse intact bilaterally: Yes Comments No claudication       Results for orders placed or performed in visit on 12/31/23  Testosterone    Collection Time: 12/31/23  7:46 AM  Result Value Ref Range   Testosterone  382.89 300.00 - 890.00 ng/dL  PSA   Collection Time: 12/31/23  7:46 AM  Result Value Ref Range   PSA 0.25 0.10 - 4.00 ng/mL  CBC with Differential/Platelet   Collection Time: 12/31/23  7:46 AM  Result Value Ref Range   WBC 3.0 (L) 4.0 - 10.5 K/uL   RBC 4.66 4.22 - 5.81 Mil/uL   Hemoglobin 12.7 (L) 13.0 - 17.0 g/dL   HCT 61.0 (L) 60.9 - 47.9 %   MCV 83.5 78.0 - 100.0 fl   MCHC 32.7 30.0 - 36.0 g/dL   RDW 86.7 88.4 - 84.4 %   Platelets 143.0 (L) 150.0 - 400.0 K/uL   Neutrophils Relative % 42.2 (L) 43.0 - 77.0 %   Lymphocytes  Relative 46.3 (H) 12.0 - 46.0 %   Monocytes Relative 9.8 3.0 - 12.0 %   Eosinophils Relative 1.0 0.0 - 5.0 %   Basophils Relative 0.7 0.0 - 3.0 %   Neutro Abs 1.3 (L) 1.4 - 7.7 K/uL   Lymphs Abs 1.4 0.7 - 4.0 K/uL   Monocytes Absolute 0.3 0.1 - 1.0 K/uL   Eosinophils Absolute 0.0 0.0 - 0.7 K/uL   Basophils Absolute 0.0 0.0 - 0.1 K/uL  Vitamin B12   Collection Time: 12/31/23  7:46 AM  Result Value Ref Range   Vitamin B-12 409 211 - 911 pg/mL  Microalbumin / creatinine urine ratio   Collection Time: 12/31/23  7:46 AM  Result Value Ref Range   Microalb, Ur <0.7 mg/dL   Creatinine,U 65.7 mg/dL   Microalb Creat Ratio Unable to calculate 0.0 - 30.0 mg/g  Hemoglobin A1c   Collection Time: 12/31/23  7:46 AM  Result Value Ref Range   Hgb A1c MFr Bld 6.7 (H) 4.6 - 6.5 %  Comprehensive metabolic panel with GFR   Collection Time: 12/31/23  7:46 AM  Result Value Ref Range   Sodium 137 135 - 145 mEq/L   Potassium 3.9 3.5 - 5.1 mEq/L   Chloride 102 96 - 112 mEq/L   CO2 29 19 - 32 mEq/L   Glucose, Bld 113 (H) 70 - 99 mg/dL   BUN 13 6 - 23 mg/dL   Creatinine, Ser 8.82 0.40 - 1.50 mg/dL   Total Bilirubin 0.6 0.2 - 1.2 mg/dL   Alkaline Phosphatase 62 39 - 117 U/L   AST 26 0 - 37 U/L   ALT 27 0 - 53 U/L   Total Protein 6.7 6.0 - 8.3 g/dL   Albumin  4.7 3.5 - 5.2 g/dL   GFR 31.36 >39.99 mL/min   Calcium  9.1 8.4 - 10.5 mg/dL  Lipid panel   Collection Time: 12/31/23  7:46 AM  Result Value Ref Range   Cholesterol 135 0 - 200 mg/dL   Triglycerides 45.9 0.0 - 149.0 mg/dL   HDL 36.59 >60.99 mg/dL   VLDL 89.1 0.0 - 59.9 mg/dL   LDL Cholesterol 60 0 - 99 mg/dL   Total CHOL/HDL Ratio 2    NonHDL 71.14     Assessment & Plan:   Problem List Items Addressed This Visit     Health maintenance examination - Primary (Chronic)   Preventative protocols reviewed and updated unless pt declined. Discussed healthy diet and lifestyle.       Type 2 diabetes mellitus with other specified complication  (HCC)   Chronic, remains diet controlled. Continue low sugar/low carb diabetic diet.  Reassess at 6 mo labs.       Relevant Medications   atorvastatin  (LIPITOR) 40 MG tablet   glucose blood (FREESTYLE LITE) test strip   Lancets (FREESTYLE) lancets   losartan  (COZAAR ) 100 MG tablet   Hyperlipidemia associated with type 2 diabetes mellitus (HCC)   Chronic, great control on atorva - continue. The 10-year ASCVD risk score (Arnett DK, et al., 2019) is: 18.8%   Values used to calculate the score:     Age: 75 years     Clincally relevant sex: Male     Is Non-Hispanic African American: Yes     Diabetic: Yes     Tobacco smoker: No     Systolic Blood Pressure: 128 mmHg     Is BP treated: Yes     HDL Cholesterol: 63.4 mg/dL     Total Cholesterol: 135 mg/dL       Relevant Medications   atorvastatin  (LIPITOR) 40 MG tablet   losartan  (COZAAR ) 100 MG tablet   Overweight with body mass index (BMI) 25.0-29.9   Hypogonadism in male   Chronic, stable period on testosterone  3 pumps daily.  Notes he misses often.  Continue current regimen, encouraged more regular use.       Essential hypertension   Chronic, stable. Continue current regimen.      Relevant Medications   atorvastatin  (LIPITOR) 40 MG tablet   losartan  (COZAAR ) 100 MG tablet   Fuchs' corneal dystrophy   Continue yearly eye exam.s       Chronic leukopenia   Chronic issue, with mild thrombocytopenia. Continue to monitor.  Periph smear normal 2024      Anemia   Mild newly noted microcytic anemia Pt denies blood loss.  Update iFOB and UA.       Relevant Orders   Fecal occult blood, imunochemical   Urinalysis, Routine w reflex microscopic   Right foot injury, initial encounter   Suspect possible distal 5th MT fracture vs involving MTPJ - check foot xray today Supportive measures reviewed.  Update if not improving over next 2-3 weeks.       Relevant Orders   DG Foot Complete Right     Meds ordered this  encounter  Medications   atorvastatin  (LIPITOR) 40 MG tablet    Sig: Take 1 tablet (40 mg total) by mouth daily.    Dispense:  90 tablet    Refill:  3   azelastine  (ASTELIN ) 0.1 % nasal spray    Sig: Place 2 sprays into both nostrils 2 (two) times daily.    Dispense:  30 mL  Refill:  3   fluticasone  (FLONASE ) 50 MCG/ACT nasal spray    Sig: 2 sprays in each nostril once daily    Dispense:  16 g    Refill:  5   glucose blood (FREESTYLE LITE) test strip    Sig: Use as instructed to check blood sugar up to 4 times daily.    Dispense:  100 each    Refill:  3   Lancets (FREESTYLE) lancets    Sig: Use up to four times daily as directed    Dispense:  100 each    Refill:  3   losartan  (COZAAR ) 100 MG tablet    Sig: Take 1 tablet (100 mg total) by mouth daily.    Dispense:  90 tablet    Refill:  3   Testosterone  20.25 MG/ACT (1.62%) GEL    Sig: Place 3 Pump onto the skin daily.    Dispense:  150 g    Refill:  5    Note new dose    Orders Placed This Encounter  Procedures   Fecal occult blood, imunochemical    Standing Status:   Future    Expiration Date:   01/08/2025   DG Foot Complete Right    Standing Status:   Future    Number of Occurrences:   1    Expiration Date:   01/08/2025    Reason for Exam (SYMPTOM  OR DIAGNOSIS REQUIRED):   R 5th toe injury 1 wk ago    Preferred imaging location?:   [facility]   Urinalysis, Routine w reflex microscopic    Standing Status:   Future    Number of Occurrences:   1    Expiration Date:   01/08/2025    Patient Instructions  Right foot xray today  Return at your convenience for nurse visit for shingles shots.  New mild anemia  - check urine and stool tests for hidden blood - pass by lab.  Good to see you today Return in 6 months for diabetes follow up visit, early am appointment Return in 1 year for next physical  Follow up plan: Return in about 1 year (around 01/08/2025), or if symptoms worsen or fail to improve, for annual  exam, prior fasting for blood work.  Anton Blas, MD

## 2024-01-09 NOTE — Assessment & Plan Note (Signed)
Continue yearly eye exams.

## 2024-01-09 NOTE — Assessment & Plan Note (Signed)
 Chronic issue, with mild thrombocytopenia. Continue to monitor.  Periph smear normal 2024

## 2024-01-09 NOTE — Assessment & Plan Note (Signed)
 Suspect possible distal 5th MT fracture vs involving MTPJ - check foot xray today Supportive measures reviewed.  Update if not improving over next 2-3 weeks.

## 2024-01-09 NOTE — Assessment & Plan Note (Addendum)
 Mild newly noted microcytic anemia Pt denies blood loss.  Update iFOB and UA.

## 2024-01-09 NOTE — Patient Instructions (Addendum)
 Right foot xray today  Return at your convenience for nurse visit for shingles shots.  New mild anemia  - check urine and stool tests for hidden blood - pass by lab.  Good to see you today Return in 6 months for diabetes follow up visit, early am appointment Return in 1 year for next physical

## 2024-01-09 NOTE — Assessment & Plan Note (Signed)
 Chronic, great control on atorva - continue. The 10-year ASCVD risk score (Arnett DK, et al., 2019) is: 18.8%   Values used to calculate the score:     Age: 58 years     Clincally relevant sex: Male     Is Non-Hispanic African American: Yes     Diabetic: Yes     Tobacco smoker: No     Systolic Blood Pressure: 128 mmHg     Is BP treated: Yes     HDL Cholesterol: 63.4 mg/dL     Total Cholesterol: 135 mg/dL

## 2024-01-09 NOTE — Assessment & Plan Note (Signed)
 Chronic, stable period on testosterone  3 pumps daily.  Notes he misses often.  Continue current regimen, encouraged more regular use.

## 2024-01-09 NOTE — Assessment & Plan Note (Signed)
 Preventative protocols reviewed and updated unless pt declined. Discussed healthy diet and lifestyle.

## 2024-01-09 NOTE — Assessment & Plan Note (Addendum)
 Chronic, remains diet controlled. Continue low sugar/low carb diabetic diet.  Reassess at 6 mo labs.

## 2024-01-10 ENCOUNTER — Ambulatory Visit: Payer: Self-pay | Admitting: Family Medicine

## 2024-01-12 ENCOUNTER — Other Ambulatory Visit: Payer: Self-pay

## 2024-01-12 DIAGNOSIS — D649 Anemia, unspecified: Secondary | ICD-10-CM

## 2024-01-13 LAB — FECAL OCCULT BLOOD, IMMUNOCHEMICAL: Fecal Occult Bld: NEGATIVE

## 2024-01-19 ENCOUNTER — Other Ambulatory Visit: Payer: Self-pay

## 2024-02-13 ENCOUNTER — Encounter: Payer: Self-pay | Admitting: Cardiology

## 2024-02-23 ENCOUNTER — Encounter: Payer: Self-pay | Admitting: Family Medicine

## 2024-02-23 ENCOUNTER — Ambulatory Visit: Admitting: Family Medicine

## 2024-02-23 ENCOUNTER — Other Ambulatory Visit: Payer: Self-pay

## 2024-02-23 ENCOUNTER — Ambulatory Visit (INDEPENDENT_AMBULATORY_CARE_PROVIDER_SITE_OTHER)
Admission: RE | Admit: 2024-02-23 | Discharge: 2024-02-23 | Disposition: A | Discharge status: Home / Self Care | Source: Ambulatory Visit | Attending: Family Medicine | Admitting: Family Medicine

## 2024-02-23 VITALS — BP 138/80 | HR 59 | Temp 97.9°F | Ht 71.5 in | Wt 215.0 lb

## 2024-02-23 DIAGNOSIS — M533 Sacrococcygeal disorders, not elsewhere classified: Secondary | ICD-10-CM

## 2024-02-23 MED ORDER — NAPROXEN 500 MG PO TABS
500.0000 mg | ORAL_TABLET | Freq: Two times a day (BID) | ORAL | 0 refills | Status: DC
  Filled 2024-02-23: qty 40, 20d supply, fill #0

## 2024-02-23 NOTE — Assessment & Plan Note (Signed)
 Present for months, acutely worse this week without inciting trauma/injury. Exam and story suspicious for R sacroiliitis/ inflammatory arthritis.  Rx naprosyn  500mg  BID with meals x1 wk then PRN.  Avoid oral prednisone  in diet controlled diabetes.  SIJ xrays today.  Discussed further evaluation with labs including inflammatory markers and HLA B27 to eval ankylosing spondylitis /other spondyloarthropathy.  Consider PT referral pending results of above.

## 2024-02-23 NOTE — Progress Notes (Signed)
 Ph: [phone] Fax: [phone]   Patient ID: Gerald Collins Finder, male    DOB: 1966-03-10, 58 y.o.   MRN: [id number]  This visit was conducted in person.  BP 138/80   Pulse (!) 59   Temp 97.9 F (36.6 C) (Oral)   Ht 5' 11.5 (1.816 m)   Wt 215 lb (97.5 kg)   SpO2 97%   BMI 29.57 kg/m    CC: R low back pain  Subjective:   HPI: Gerald Collins is a 58 y.o. male presenting on 02/23/2024 for Medical Management of Chronic Issues (Pt C/o lower back pain and R side pain. Does not Radiate anywhere.  Not due to any injury. No issues with urinating.)   Several months of low back pain but over the past week getting worse - R low back pain without radiation .  Denies inciting trauma injury or falls.  Ibuprofen and stretching helps but temporarily.  Most noticeable with prolonged sitting ie long drives.  Notes morning pain, stiffness , that improves with stretching.  Not activity limiting.   No shooting pain down legs, numbness, weakness, bowel/bladder accidents, fever/chills, saddle anesthesia.   He has been lifting weights.  Both elbows sometimes hurt.  No other joint pain, swelling, redness, warmth.      Relevant past medical, surgical, family and social history reviewed and updated as indicated. Interim medical history since our last visit reviewed. Allergies and medications reviewed and updated. Outpatient Medications Prior to Visit  Medication Sig Dispense Refill   amLODipine  (NORVASC ) 10 MG tablet Take 1 tablet (10 mg total) by mouth daily. 90 tablet 3   atorvastatin  (LIPITOR) 40 MG tablet Take 1 tablet (40 mg total) by mouth daily. 90 tablet 3   azelastine  (ASTELIN ) 0.1 % nasal spray Place 2 sprays into both nostrils 2 (two) times daily. 30 mL 3   Blood Glucose Monitoring Suppl (FREESTYLE FREEDOM LITE) w/Device KIT Use up to four times daily as directed. 1 kit 0   fluticasone  (FLONASE ) 50 MCG/ACT nasal spray 2 sprays in each nostril once daily 16 g 5   glucose blood  (FREESTYLE LITE) test strip Use as instructed to check blood sugar up to 4 times daily. 100 each 3   Lancets (FREESTYLE) lancets Use up to four times daily as directed 100 each 3   losartan  (COZAAR ) 100 MG tablet Take 1 tablet (100 mg total) by mouth daily. 90 tablet 3   Testosterone  20.25 MG/ACT (1.62%) GEL Place 3 Pump onto the skin daily. 150 g 5   vitamin C (ASCORBIC ACID) 500 MG tablet Take 500 mg by mouth daily.     No facility-administered medications prior to visit.     Per HPI unless specifically indicated in ROS section below Review of Systems  Objective:  BP 138/80   Pulse (!) 59   Temp 97.9 F (36.6 C) (Oral)   Ht 5' 11.5 (1.816 m)   Wt 215 lb (97.5 kg)   SpO2 97%   BMI 29.57 kg/m   Wt Readings from Last 3 Encounters:  02/23/24 215 lb (97.5 kg)  01/09/24 211 lb 6 oz (95.9 kg)  09/29/23 217 lb 4 oz (98.5 kg)      Physical Exam Vitals and nursing note reviewed.  Constitutional:      Appearance: Normal appearance. He is not ill-appearing.  Musculoskeletal:        General: Tenderness present. No swelling or signs of injury.     Right lower leg: No edema.  Left lower leg: No edema.     Comments:  No pain midline spine No paraspinous mm tenderness Neg SLR bilaterally. No pain with int/ext rotation at hip. + FABER on right. No pain at GTB or sciatic notch bilaterally.  Reproducible tenderness to palpation at R SIJ.   Skin:    General: Skin is warm and dry.     Findings: No rash.  Neurological:     General: No focal deficit present.     Mental Status: He is alert.     Sensory: Sensation is intact.     Motor: Motor function is intact.     Coordination: Coordination is intact.     Gait: Gait is intact.     Deep Tendon Reflexes:     Reflex Scores:      Patellar reflexes are 2+ on the right side and 2+ on the left side.    Comments:  5/5 strength BLE Sensation intact  Psychiatric:        Mood and Affect: Mood normal.        Behavior: Behavior normal.         Lab Results  Component Value Date   HGBA1C 6.7 (H) 12/31/2023   Lab Results  Component Value Date   NA 137 12/31/2023   CL 102 12/31/2023   K 3.9 12/31/2023   CO2 29 12/31/2023   BUN 13 12/31/2023   CREATININE 1.17 12/31/2023   GFR 68.63 12/31/2023   CALCIUM  9.1 12/31/2023   ALBUMIN 4.7 12/31/2023   GLUCOSE 113 (H) 12/31/2023    Assessment & Plan:   Problem List Items Addressed This Visit     Pain of right sacroiliac joint - Primary   Present for months, acutely worse this week without inciting trauma/injury. Exam and story suspicious for R sacroiliitis/ inflammatory arthritis.  Rx naprosyn  500mg  BID with meals x1 wk then PRN.  Avoid oral prednisone  in diet controlled diabetes.  SIJ xrays today.  Discussed further evaluation with labs including inflammatory markers and HLA B27 to eval ankylosing spondylitis /other spondyloarthropathy.  Consider PT referral pending results of above.       Relevant Orders   DG Si Joints     Meds ordered this encounter  Medications   naproxen  (NAPROSYN ) 500 MG tablet    Sig: Take 1 tablet (500 mg total) by mouth 2 (two) times daily with a meal. For 1 week then as needed for pain    Dispense:  40 tablet    Refill:  0    Orders Placed This Encounter  Procedures   DG Si Joints    Standing Status:   Future    Number of Occurrences:   1    Expiration Date:   02/22/2025    Reason for Exam (SYMPTOM  OR DIAGNOSIS REQUIRED):   R sacroiliac joint pain x months    Preferred imaging location?:   [facility]    Patient Instructions  Sounds like right sacroiliitis or inflammation of sacroiliac joint.  Treat with naprosyn  course - take twice daily with meals for 5-7 days then as needed. Heating pad to lower back  Xrays today  Gentle stretching exercises provided today. Let us  know if not improving for labwork and/or further evaluation/treatment.   Follow up plan: Return if symptoms worsen or fail to improve.  Anton Blas, MD

## 2024-02-23 NOTE — Patient Instructions (Addendum)
 Sounds like right sacroiliitis or inflammation of sacroiliac joint.  Treat with naprosyn  course - take twice daily with meals for 5-7 days then as needed. Heating pad to lower back  Xrays today  Gentle stretching exercises provided today. Let us  know if not improving for labwork and/or further evaluation/treatment.

## 2024-03-01 ENCOUNTER — Ambulatory Visit: Payer: Self-pay | Admitting: Family Medicine

## 2024-03-24 ENCOUNTER — Other Ambulatory Visit: Payer: Self-pay

## 2024-03-29 ENCOUNTER — Ambulatory Visit: Attending: Cardiology | Admitting: Cardiology

## 2024-03-29 ENCOUNTER — Encounter: Payer: Self-pay | Admitting: Cardiology

## 2024-03-29 VITALS — BP 126/62 | HR 64 | Ht 71.5 in | Wt 215.2 lb

## 2024-03-29 DIAGNOSIS — R002 Palpitations: Secondary | ICD-10-CM

## 2024-03-29 DIAGNOSIS — E7800 Pure hypercholesterolemia, unspecified: Secondary | ICD-10-CM

## 2024-03-29 DIAGNOSIS — I10 Essential (primary) hypertension: Secondary | ICD-10-CM | POA: Diagnosis not present

## 2024-03-29 NOTE — Patient Instructions (Signed)

## 2024-03-29 NOTE — Progress Notes (Signed)
 Cardiology Office Note:    Date:  03/29/2024   ID:  Gerald Collins, DOB 25-Nov-1965, MRN [id number]  PCP:  Rilla Baller, MD   [facility] Providers Cardiologist:  Redell Cave, MD     Referring MD: Rilla Baller, MD   Chief Complaint  Patient presents with   Follow-up    12 month follow up pat has been doing well with no complaints of chest pain, chest pressure or SOB, medciation reviewed verbally with patient    History of Present Illness:    Gerald Collins is a 58 y.o. male with a hx of hypertension, hyperlipidemia, diabetes who presents due to palpitations.  Had an episode of palpitations when daughter was getting married about 2 months ago.  States feeling a little stressed at the time.  Has not had any symptoms since.  Previously seen about 2 years ago for similar episodes of palpitations associated with stress.  Overall improved with stress reduction.  Cardiac monitor did not reveal any sustained arrhythmias.  Symptoms are very infrequent.  Overall feels well.   Past Medical History:  Diagnosis Date   Allergy Laytex   Diabetes type 2, controlled (HCC) 2012   controlled with lifestyle (diet, exercise) change   H/O: pneumonia 06/11/1987   HLD (hyperlipidemia)    HTN (hypertension)    no meds    Other specified visual disturbances     Past Surgical History:  Procedure Laterality Date   COLONOSCOPY  09/2015   hyperplastic polpy rpt 10 yrs Oletta)   SHOULDER ARTHROSCOPY WITH OPEN ROTATOR CUFF REPAIR Right 04/01/2019   RIGHT SHOULDER ARTHROSCOPY, ARTHROSCOPIC LABRAL REPAIR, SUBACROMIAL DECOMPRESSION, DISTAL CLAVICLE EXCISION, MINI OPEN ROTATOR CUFF REPAIR;  Krasinski, Kevin, MD   VASECTOMY  1998    Current Medications: Current Meds  Medication Sig   amLODipine  (NORVASC ) 10 MG tablet Take 1 tablet (10 mg total) by mouth daily.   atorvastatin  (LIPITOR) 40 MG tablet Take 1 tablet (40 mg total) by mouth daily.   azelastine  (ASTELIN ) 0.1 % nasal spray  Place 2 sprays into both nostrils 2 (two) times daily.   Blood Glucose Monitoring Suppl (FREESTYLE FREEDOM LITE) w/Device KIT Use up to four times daily as directed.   fluticasone  (FLONASE ) 50 MCG/ACT nasal spray 2 sprays in each nostril once daily   glucose blood (FREESTYLE LITE) test strip Use as instructed to check blood sugar up to 4 times daily.   Lancets (FREESTYLE) lancets Use up to four times daily as directed   losartan  (COZAAR ) 100 MG tablet Take 1 tablet (100 mg total) by mouth daily.   naproxen  (NAPROSYN ) 500 MG tablet Take 1 tablet (500 mg total) by mouth 2 (two) times daily with a meal. For 1 week then as needed for pain   Testosterone  20.25 MG/ACT (1.62%) GEL Place 3 Pump onto the skin daily.   vitamin C (ASCORBIC ACID) 500 MG tablet Take 500 mg by mouth daily.     Allergies:   Latex and Metformin   Social History   Socioeconomic History   Marital status: Married    Spouse name: Not on file   Number of children: 2   Years of education: Not on file   Highest education level: Bachelor's degree (e.g., BA, AB, BS)  Occupational History   Occupation: Statistician: [employer]  Tobacco Use   Smoking status: Former    Types: Cigars   Smokeless tobacco: Never   Tobacco comments:    on occasion only-never regularly  Vaping Use   Vaping status: Never Used  Substance and Sexual Activity   Alcohol use: Yes    Alcohol/week: 0.0 standard drinks of alcohol    Comment: Rare   Drug use: No   Sexual activity: Not on file  Other Topics Concern   Not on file  Social History Narrative   Caffeine: 0   Lives with wife Banker), 2 kids, no pets   Lab Tech at [employer]   Activity: 1-2 hours every morning combination of weights and aerobic exercise   Diet: good water, fruits/vegetables daily   Social Drivers of Corporate investment banker Strain: Low Risk  (01/05/2024)   Overall Financial Resource Strain (CARDIA)    Difficulty of Paying Living Expenses: Not hard at all  Food  Insecurity: No Food Insecurity (01/05/2024)   Hunger Vital Sign    Worried About Running Out of Food in the Last Year: Never true    Ran Out of Food in the Last Year: Never true  Transportation Needs: No Transportation Needs (01/05/2024)   PRAPARE - Administrator, Civil Service (Medical): No    Lack of Transportation (Non-Medical): No  Physical Activity: Sufficiently Active (01/05/2024)   Exercise Vital Sign    Days of Exercise per Week: 4 days    Minutes of Exercise per Session: 140 min  Stress: No Stress Concern Present (01/05/2024)   [employer] of Occupational Health - Occupational Stress Questionnaire    Feeling of Stress: Not at all  Social Connections: Moderately Integrated (01/05/2024)   Social Connection and Isolation Panel    Frequency of Communication with Friends and Family: Three times a week    Frequency of Social Gatherings with Friends and Family: Once a week    Attends Religious Services: More than 4 times per year    Active Member of [employer] or Organizations: No    Attends Engineer, structural: Not on file    Marital Status: Married     Family History: The patient's family history includes Cancer in his maternal uncle and mother; Colon polyps in his maternal aunt and mother; Coronary artery disease (age of onset: 47) in his maternal grandfather; Healthy in his father; Hyperlipidemia in his mother; Hypertension in his mother; Other in his brother.  ROS:   Please see the history of present illness.     All other systems reviewed and are negative.  EKGs/Labs/Other Studies Reviewed:    The following studies were reviewed today:   EKG:  EKG is ordered today.  EKG shows normal sinus rhythm, normal ECG.  Recent Labs: 12/31/2023: ALT 27; BUN 13; Creatinine, Ser 1.17; Hemoglobin 12.7; Platelets 143.0; Potassium 3.9; Sodium 137  Recent Lipid Panel    Component Value Date/Time   CHOL 135 12/31/2023 0746   CHOL 162 05/22/2021 1205   CHOL 188  11/08/2010 0000   TRIG 54.0 12/31/2023 0746   TRIG 101 11/08/2010 0000   HDL 63.40 12/31/2023 0746   HDL 56 05/22/2021 1205   CHOLHDL 2 12/31/2023 0746   VLDL 10.8 12/31/2023 0746   LDLCALC 60 12/31/2023 0746   LDLCALC 60 03/19/2023 1604   LDLDIRECT 117 11/08/2010 0000     Risk Assessment/Calculations:         Physical Exam:    VS:  BP 126/62 (Cuff Size: Large)   Pulse 64   Ht 5' 11.5 (1.816 m)   Wt 215 lb 3.2 oz (97.6 kg)   SpO2 99%   BMI 29.60 kg/m  Wt Readings from Last 3 Encounters:  03/29/24 215 lb 3.2 oz (97.6 kg)  02/23/24 215 lb (97.5 kg)  01/09/24 211 lb 6 oz (95.9 kg)     GEN:  Well nourished, well developed in no acute distress HEENT: Normal NECK: No JVD; No carotid bruits CARDIAC: RRR, no murmurs, rubs, gallops RESPIRATORY:  Clear to auscultation without rales, wheezing or rhonchi  ABDOMEN: Soft, non-tender, non-distended MUSCULOSKELETAL:  No edema; No deformity  SKIN: Warm and dry NEUROLOGIC:  Alert and oriented x 3 PSYCHIATRIC:  Normal affect   ASSESSMENT:    1. Palpitations   2. Pure hypercholesterolemia   3. Primary hypertension    PLAN:    In order of problems listed above:  Palpitations, associated with stress.  Cardiac monitor with no significant or sustained arrhythmias.  Symptoms are very infrequent.  Continue to monitor off AV nodal agents for now, consider beta-blocker if symptoms become more persistent.   Hypertension, BP well-controlled..  Continue amlodipine  to 10 mg daily, losartan  100 mg daily.  Adherence to low-salt diet advised. Hyperlipidemia, cholesterol controlled, continue Lipitor 40 mg daily.  Follow-up as needed.    Medication Adjustments/Labs and Tests Ordered: Current medicines are reviewed at length with the patient today.  Concerns regarding medicines are outlined above.  Orders Placed This Encounter  Procedures   EKG 12-Lead   No orders of the defined types were placed in this encounter.   Patient  Instructions  Medication Instructions:  Your physician recommends that you continue on your current medications as directed. Please refer to the Current Medication list given to you today.   *If you need a refill on your cardiac medications before your next appointment, please call your pharmacy*  Lab Work: No labs ordered today  If you have labs (blood work) drawn today and your tests are completely normal, you will receive your results only by: MyChart Message (if you have MyChart) OR A paper copy in the mail If you have any lab test that is abnormal or we need to change your treatment, we will call you to review the results.  Testing/Procedures: No test ordered today   Follow-Up: At [facility], you and your health needs are our priority.  As part of our continuing mission to provide you with exceptional heart care, our providers are all part of one team.  This team includes your primary Cardiologist (physician) and Advanced Practice Providers or APPs (Physician Assistants and Nurse Practitioners) who all work together to provide you with the care you need, when you need it.  Your next appointment:   As needed            Signed, Redell Cave, MD  03/29/2024 11:45 AM    [facility]

## 2024-06-02 ENCOUNTER — Other Ambulatory Visit: Payer: Self-pay

## 2024-06-04 ENCOUNTER — Other Ambulatory Visit: Payer: Self-pay

## 2024-06-04 ENCOUNTER — Encounter: Payer: Self-pay | Admitting: Family Medicine

## 2024-06-04 ENCOUNTER — Ambulatory Visit: Admitting: Family Medicine

## 2024-06-04 VITALS — BP 120/84 | HR 59 | Temp 98.2°F | Ht 71.46 in | Wt 216.0 lb

## 2024-06-04 DIAGNOSIS — D696 Thrombocytopenia, unspecified: Secondary | ICD-10-CM

## 2024-06-04 DIAGNOSIS — R10A2 Flank pain, left side: Secondary | ICD-10-CM | POA: Diagnosis not present

## 2024-06-04 DIAGNOSIS — M533 Sacrococcygeal disorders, not elsewhere classified: Secondary | ICD-10-CM | POA: Diagnosis not present

## 2024-06-04 DIAGNOSIS — D72819 Decreased white blood cell count, unspecified: Secondary | ICD-10-CM | POA: Diagnosis not present

## 2024-06-04 LAB — CBC WITH DIFFERENTIAL/PLATELET
Basophils Absolute: 0 K/uL (ref 0.0–0.1)
Basophils Relative: 0.6 % (ref 0.0–3.0)
Eosinophils Absolute: 0 K/uL (ref 0.0–0.7)
Eosinophils Relative: 0.8 % (ref 0.0–5.0)
HCT: 39.3 % (ref 39.0–52.0)
Hemoglobin: 13.1 g/dL (ref 13.0–17.0)
Lymphocytes Relative: 42 % (ref 12.0–46.0)
Lymphs Abs: 1.4 K/uL (ref 0.7–4.0)
MCHC: 33.3 g/dL (ref 30.0–36.0)
MCV: 82.4 fl (ref 78.0–100.0)
Monocytes Absolute: 0.3 K/uL (ref 0.1–1.0)
Monocytes Relative: 8.9 % (ref 3.0–12.0)
Neutro Abs: 1.6 K/uL (ref 1.4–7.7)
Neutrophils Relative %: 47.7 % (ref 43.0–77.0)
Platelets: 144 K/uL — ABNORMAL LOW (ref 150.0–400.0)
RBC: 4.77 Mil/uL (ref 4.22–5.81)
RDW: 13 % (ref 11.5–15.5)
WBC: 3.3 K/uL — ABNORMAL LOW (ref 4.0–10.5)

## 2024-06-04 LAB — POC URINALSYSI DIPSTICK (AUTOMATED)
Bilirubin, UA: NEGATIVE
Blood, UA: NEGATIVE
Glucose, UA: NEGATIVE
Ketones, UA: NEGATIVE
Leukocytes, UA: NEGATIVE
Nitrite, UA: NEGATIVE
Protein, UA: NEGATIVE
Spec Grav, UA: 1.015
Urobilinogen, UA: 0.2 U/dL
pH, UA: 6

## 2024-06-04 LAB — BASIC METABOLIC PANEL WITH GFR
BUN: 13 mg/dL (ref 6–23)
CO2: 30 meq/L (ref 19–32)
Calcium: 9.3 mg/dL (ref 8.4–10.5)
Chloride: 101 meq/L (ref 96–112)
Creatinine, Ser: 1.13 mg/dL (ref 0.40–1.50)
GFR: 71.34 mL/min
Glucose, Bld: 89 mg/dL (ref 70–99)
Potassium: 4.5 meq/L (ref 3.5–5.1)
Sodium: 138 meq/L (ref 135–145)

## 2024-06-04 LAB — C-REACTIVE PROTEIN: CRP: <0.5 mg/dL — ABNORMAL LOW (ref 1.0–20.0)

## 2024-06-04 LAB — SEDIMENTATION RATE: Sed Rate: 1 mm/h (ref 0–20)

## 2024-06-04 MED ORDER — CYCLOBENZAPRINE HCL 10 MG PO TABS
5.0000 mg | ORAL_TABLET | Freq: Two times a day (BID) | ORAL | 0 refills | Status: AC | PRN
  Filled 2024-06-04: qty 30, 15d supply, fill #0

## 2024-06-04 NOTE — Progress Notes (Signed)
 " Ph: [phone] Fax: [phone]   Patient ID: Gerald Collins, male    DOB: [birth year], 58 y.o.   MRN: [id number]  This visit was conducted in person.  BP 120/84 (Cuff Size: Large)   Pulse (!) 59   Temp 98.2 F (36.8 C) (Oral)   Ht 5' 11.46 (1.815 m)   Wt 216 lb (98 kg)   SpO2 99%   BMI 29.74 kg/m    CC: back pain  Subjective:   HPI: Gerald Collins is a 58 y.o. male presenting on 06/04/2024 for Acute Visit (Pt has left sided mid- to lower back pain, this new pain has started 3 days ago but pt has been seen previously for lower back pain/No known injury, 3/10 on pain scale, dull pain)   See prior note for details.  Seen here 02/2024 with R SIJ pain, concern for  inflammatory arthritis/sacroiliitis. Xrays with mild symmetric superior SIJ sclerosis as per below. Treated with naprosyn  course without significant benefit. We've been avoiding oral prednisone  in diet controlled diabetes.  Previously, most noticeable with prolonged sitting ie long drives.  Also noted morning pain and stiffness, that improved with stretching.  Not activity limiting.   Lab Results  Component Value Date   HGBA1C 6.7 (H) 12/31/2023     Today notes left lower back pain into flank that started 3d ago. Dull achey 3/10 discomfort. Pain is positional - worse when transitioning to standing.  Denies inciting trauma/injury or falls.  No fevers/chills, urinary changes, abd pain, nausea/diarrhea.  No weight changes, swollen glands, night sweats, fatigue, new joint pains or rashes.   Intermittently using testosterone  3 pumps. Energy levels are improved.      Relevant past medical, surgical, family and social history reviewed and updated as indicated. Interim medical history since our last visit reviewed. Allergies and medications reviewed and updated. Outpatient Medications Prior to Visit  Medication Sig Dispense Refill   amLODipine  (NORVASC ) 10 MG tablet Take 1 tablet (10 mg total) by mouth daily. 90  tablet 3   atorvastatin  (LIPITOR) 40 MG tablet Take 1 tablet (40 mg total) by mouth daily. 90 tablet 3   azelastine  (ASTELIN ) 0.1 % nasal spray Place 2 sprays into both nostrils 2 (two) times daily. (Patient taking differently: Place 2 sprays into both nostrils as needed for rhinitis.) 30 mL 3   Blood Glucose Monitoring Suppl (FREESTYLE FREEDOM LITE) w/Device KIT Use up to four times daily as directed. 1 kit 0   fluticasone  (FLONASE ) 50 MCG/ACT nasal spray 2 sprays in each nostril once daily (Patient taking differently: Place 2 sprays into both nostrils as needed for rhinitis.) 16 g 5   glucose blood (FREESTYLE LITE) test strip Use as instructed to check blood sugar up to 4 times daily. 100 each 3   Lancets (FREESTYLE) lancets Use up to four times daily as directed 100 each 3   losartan  (COZAAR ) 100 MG tablet Take 1 tablet (100 mg total) by mouth daily. 90 tablet 3   Testosterone  20.25 MG/ACT (1.62%) GEL Place 3 Pump onto the skin daily. 150 g 5   vitamin C (ASCORBIC ACID) 500 MG tablet Take 500 mg by mouth daily.     naproxen  (NAPROSYN ) 500 MG tablet Take 1 tablet (500 mg total) by mouth 2 (two) times daily with a meal. For 1 week then as needed for pain (Patient not taking: Reported on 06/04/2024) 40 tablet 0   No facility-administered medications prior to visit.     Per  HPI unless specifically indicated in ROS section below Review of Systems  Objective:  BP 120/84 (Cuff Size: Large)   Pulse (!) 59   Temp 98.2 F (36.8 C) (Oral)   Ht 5' 11.46 (1.815 m)   Wt 216 lb (98 kg)   SpO2 99%   BMI 29.74 kg/m   Wt Readings from Last 3 Encounters:  06/04/24 216 lb (98 kg)  03/29/24 215 lb 3.2 oz (97.6 kg)  02/23/24 215 lb (97.5 kg)      Physical Exam Vitals and nursing note reviewed.  Constitutional:      Appearance: Normal appearance. He is not ill-appearing.  HENT:     Head: Normocephalic and atraumatic.     Mouth/Throat:     Mouth: Mucous membranes are moist.     Pharynx:  Oropharynx is clear. No oropharyngeal exudate or posterior oropharyngeal erythema.  Eyes:     Extraocular Movements: Extraocular movements intact.     Pupils: Pupils are equal, round, and reactive to light.  Cardiovascular:     Rate and Rhythm: Normal rate and regular rhythm.     Pulses: Normal pulses.     Heart sounds: Normal heart sounds. No murmur heard. Pulmonary:     Effort: Pulmonary effort is normal. No respiratory distress.     Breath sounds: Normal breath sounds. No wheezing, rhonchi or rales.  Abdominal:     General: Bowel sounds are normal. There is no distension.     Palpations: Abdomen is soft. There is no mass.     Tenderness: There is no abdominal tenderness. There is no right CVA tenderness, left CVA tenderness, guarding or rebound.     Hernia: No hernia is present.  Musculoskeletal:     Right lower leg: No edema.     Left lower leg: No edema.     Comments:  No pain midline spine No paraspinous mm tenderness Neg SLR bilaterally. No pain with int/ext rotation at hip. Neg FABER. No pain at SIJ, GTB or sciatic notch bilaterally.   Skin:    General: Skin is warm and dry.     Findings: No rash.  Neurological:     Mental Status: He is alert.  Psychiatric:        Mood and Affect: Mood normal.        Behavior: Behavior normal.       Results for orders placed or performed in visit on 06/04/24  Basic metabolic panel with GFR   Collection Time: 06/04/24 12:48 PM  Result Value Ref Range   Sodium 138 135 - 145 mEq/L   Potassium 4.5 3.5 - 5.1 mEq/L   Chloride 101 96 - 112 mEq/L   CO2 30 19 - 32 mEq/L   Glucose, Bld 89 70 - 99 mg/dL   BUN 13 6 - 23 mg/dL   Creatinine, Ser 8.86 0.40 - 1.50 mg/dL   GFR 28.65 >39.99 mL/min   Calcium  9.3 8.4 - 10.5 mg/dL  CBC with Differential/Platelet   Collection Time: 06/04/24 12:48 PM  Result Value Ref Range   WBC 3.3 (L) 4.0 - 10.5 K/uL   RBC 4.77 4.22 - 5.81 Mil/uL   Hemoglobin 13.1 13.0 - 17.0 g/dL   HCT 60.6 60.9 - 47.9 %    MCV 82.4 78.0 - 100.0 fl   MCHC 33.3 30.0 - 36.0 g/dL   RDW 86.9 88.4 - 84.4 %   Platelets 144.0 (L) 150.0 - 400.0 K/uL   Neutrophils Relative % 47.7 43.0 - 77.0 %  Lymphocytes Relative 42.0 12.0 - 46.0 %   Monocytes Relative 8.9 3.0 - 12.0 %   Eosinophils Relative 0.8 0.0 - 5.0 %   Basophils Relative 0.6 0.0 - 3.0 %   Neutro Abs 1.6 1.4 - 7.7 K/uL   Lymphs Abs 1.4 0.7 - 4.0 K/uL   Monocytes Absolute 0.3 0.1 - 1.0 K/uL   Eosinophils Absolute 0.0 0.0 - 0.7 K/uL   Basophils Absolute 0.0 0.0 - 0.1 K/uL  Sedimentation rate   Collection Time: 06/04/24 12:48 PM  Result Value Ref Range   Sed Rate 1 0 - 20 mm/hr  C-reactive protein   Collection Time: 06/04/24 12:48 PM  Result Value Ref Range   CRP <0.5 (L) 1.0 - 20.0 mg/dL  POCT Urinalysis Dipstick (Automated)   Collection Time: 06/04/24 12:53 PM  Result Value Ref Range   Color, UA Yellow    Clarity, UA Clear    Glucose, UA Negative Negative   Bilirubin, UA Negative    Ketones, UA Negative    Spec Grav, UA 1.015 1.010 - 1.025   Blood, UA Negative    pH, UA 6.0 5.0 - 8.0   Protein, UA Negative Negative   Urobilinogen, UA 0.2 0.2 or 1.0 E.U./dL   Nitrite, UA Negative    Leukocytes, UA Negative Negative    DG Si Joints CLINICAL DATA:  Right SI joint pain for months  EXAM: BILATERAL SACROILIAC JOINTS - 3+ VIEW  COMPARISON:  None Available.  FINDINGS: SI joints are maintained. Minor symmetric sclerosis of the SI joints along the superior aspects. Bony pelvis and hips are unremarkable. No acute osseous finding or fracture. No diastasis.  IMPRESSION: Minor SI joint sclerosis. No acute finding by plain radiography.  Electronically Signed   By: CHRISTELLA.  Shick M.D.   On: 02/28/2024 18:03  Assessment & Plan:   Problem List Items Addressed This Visit     Chronic leukopenia   Chronic over the past 5 years (low-normal range prior), along with new mild thrombocytopenia over the past 1 year. Update CBC today.  Periph smear  2024 reassuring  ?benign ethnic neutropenia (normal variant), but first r/o rheumatologic/autoimmune cause.       Relevant Orders   CBC with Differential/Platelet (Completed)   Pain of right sacroiliac joint   Ongoing, intermittent. Xray showed mild superior symmetrical SIJ sclerosis - ?inflammatory arthritis (spondyloarthropathy) Update inflammatory markers today along with ANA and HLA B27 Ab Naprosyn  wasn't too beneficial       Relevant Orders   Sedimentation rate (Completed)   HLA-B27 antigen   C-reactive protein (Completed)   ANA   Left flank pain - Primary   Suspect MSK cause.  Check UA - normal Update renal panel.  Rx flexeril  muscle relaxant, update if symptoms persist.       Relevant Orders   Basic metabolic panel with GFR (Completed)   POCT Urinalysis Dipstick (Automated) (Completed)   Thrombocytopenia   Mild, developed over the past year.  See above.         Meds ordered this encounter  Medications   cyclobenzaprine  (FLEXERIL ) 10 MG tablet    Sig: Take 0.5-1 tablets (5-10 mg total) by mouth 2 (two) times daily as needed for muscle spasms (sedation preacutions).    Dispense:  30 tablet    Refill:  0    Orders Placed This Encounter  Procedures   Basic metabolic panel with GFR   CBC with Differential/Platelet   Sedimentation rate   HLA-B27 antigen   C-reactive  protein   ANA   POCT Urinalysis Dipstick (Automated)    Patient Instructions  Labs today for further evaluation for inflammatory arthritis.  Urine test today  May try flexeril  muscle relaxant for left back pain - take 10mg  1/2-1 tablet twice daily as needed.  Let us  know if not improving with this.   Follow up plan: Return if symptoms worsen or fail to improve.  Anton Blas, MD   "

## 2024-06-04 NOTE — Patient Instructions (Addendum)
 Labs today for further evaluation for inflammatory arthritis.  Urine test today  May try flexeril  muscle relaxant for left back pain - take 10mg  1/2-1 tablet twice daily as needed.  Let us  know if not improving with this.

## 2024-06-05 ENCOUNTER — Ambulatory Visit: Payer: Self-pay | Admitting: Family Medicine

## 2024-06-05 DIAGNOSIS — D696 Thrombocytopenia, unspecified: Secondary | ICD-10-CM | POA: Insufficient documentation

## 2024-06-05 NOTE — Assessment & Plan Note (Addendum)
 Chronic over the past 5 years (low-normal range prior), along with new mild thrombocytopenia over the past 1 year. Update CBC today.  Periph smear 2024 reassuring  ?benign ethnic neutropenia (normal variant), but first r/o rheumatologic/autoimmune cause.

## 2024-06-05 NOTE — Assessment & Plan Note (Addendum)
 Ongoing, intermittent. Xray showed mild superior symmetrical SIJ sclerosis - ?inflammatory arthritis (spondyloarthropathy) Update inflammatory markers today along with ANA and HLA B27 Ab Naprosyn  wasn't too beneficial

## 2024-06-05 NOTE — Assessment & Plan Note (Signed)
 Mild, developed over the past year.  See above.

## 2024-06-05 NOTE — Assessment & Plan Note (Addendum)
 Suspect MSK cause.  Check UA - normal Update renal panel.  Rx flexeril  muscle relaxant, update if symptoms persist.

## 2024-06-07 LAB — ANA

## 2024-06-07 LAB — HLA-B27 ANTIGEN: HLA-B27 Antigen: NEGATIVE

## 2024-06-08 ENCOUNTER — Ambulatory Visit

## 2024-06-08 DIAGNOSIS — M533 Sacrococcygeal disorders, not elsewhere classified: Secondary | ICD-10-CM

## 2024-06-08 DIAGNOSIS — D696 Thrombocytopenia, unspecified: Secondary | ICD-10-CM

## 2024-06-08 DIAGNOSIS — R10A2 Flank pain, left side: Secondary | ICD-10-CM

## 2024-06-08 DIAGNOSIS — D72819 Decreased white blood cell count, unspecified: Secondary | ICD-10-CM

## 2024-06-08 NOTE — Addendum Note (Signed)
 Addended by: HOPE VEVA PARAS on: 06/08/2024 11:18 AM   Modules accepted: Orders

## 2024-06-09 LAB — ANA: Anti Nuclear Antibody (ANA): NEGATIVE

## 2024-07-19 ENCOUNTER — Ambulatory Visit: Admitting: Family Medicine
